# Patient Record
Sex: Female | Born: 1935 | Race: White | Hispanic: No | Marital: Married | Smoking: Never smoker
Health system: Southern US, Community
[De-identification: ages and names within clinical notes are randomized; demographics above are authoritative.]

## PROBLEM LIST (undated history)

## (undated) DIAGNOSIS — I480 Paroxysmal atrial fibrillation: Secondary | ICD-10-CM

## (undated) DIAGNOSIS — K219 Gastro-esophageal reflux disease without esophagitis: Secondary | ICD-10-CM

## (undated) DIAGNOSIS — D649 Anemia, unspecified: Secondary | ICD-10-CM

## (undated) DIAGNOSIS — M353 Polymyalgia rheumatica: Secondary | ICD-10-CM

## (undated) DIAGNOSIS — I4891 Unspecified atrial fibrillation: Secondary | ICD-10-CM

## (undated) DIAGNOSIS — M810 Age-related osteoporosis without current pathological fracture: Secondary | ICD-10-CM

## (undated) DIAGNOSIS — M1990 Unspecified osteoarthritis, unspecified site: Secondary | ICD-10-CM

## (undated) HISTORY — PX: HIP FRACTURE SURGERY: SHX118

## (undated) HISTORY — DX: Paroxysmal atrial fibrillation: I48.0

## (undated) HISTORY — PX: INSERT / REPLACE / REMOVE PACEMAKER: SUR710

## (undated) HISTORY — DX: Age-related osteoporosis without current pathological fracture: M81.0

## (undated) HISTORY — DX: Anemia, unspecified: D64.9

## (undated) HISTORY — DX: Gastro-esophageal reflux disease without esophagitis: K21.9

## (undated) HISTORY — DX: Unspecified atrial fibrillation: I48.91

## (undated) HISTORY — DX: Unspecified osteoarthritis, unspecified site: M19.90

## (undated) HISTORY — DX: Polymyalgia rheumatica: M35.3

## (undated) HISTORY — PX: ABDOMINAL HYSTERECTOMY: SHX81

---

## 1999-11-09 ENCOUNTER — Inpatient Hospital Stay (HOSPITAL_COMMUNITY): Admission: RE | Admit: 1999-11-09 | Discharge: 1999-11-11 | Payer: Self-pay | Admitting: Obstetrics & Gynecology

## 1999-11-09 ENCOUNTER — Encounter (INDEPENDENT_AMBULATORY_CARE_PROVIDER_SITE_OTHER): Payer: Self-pay

## 2000-11-16 ENCOUNTER — Other Ambulatory Visit: Admission: RE | Admit: 2000-11-16 | Discharge: 2000-11-16 | Payer: Self-pay | Admitting: Obstetrics & Gynecology

## 2001-12-11 ENCOUNTER — Other Ambulatory Visit: Admission: RE | Admit: 2001-12-11 | Discharge: 2001-12-11 | Payer: Self-pay | Admitting: Obstetrics & Gynecology

## 2003-07-09 HISTORY — PX: COLONOSCOPY: SHX174

## 2008-10-21 ENCOUNTER — Ambulatory Visit (HOSPITAL_COMMUNITY): Admission: RE | Admit: 2008-10-21 | Discharge: 2008-10-22 | Payer: Self-pay | Admitting: Orthopedic Surgery

## 2010-05-14 IMAGING — CR DG CHEST 2V
2 series · 2 of 2 positions shown · non-contrast
Comparison: None

CLINICAL DATA: Preoperative respiratory exam.  Thoracic spine
fractures.

CHEST - 2 VIEW

[w chest pa]
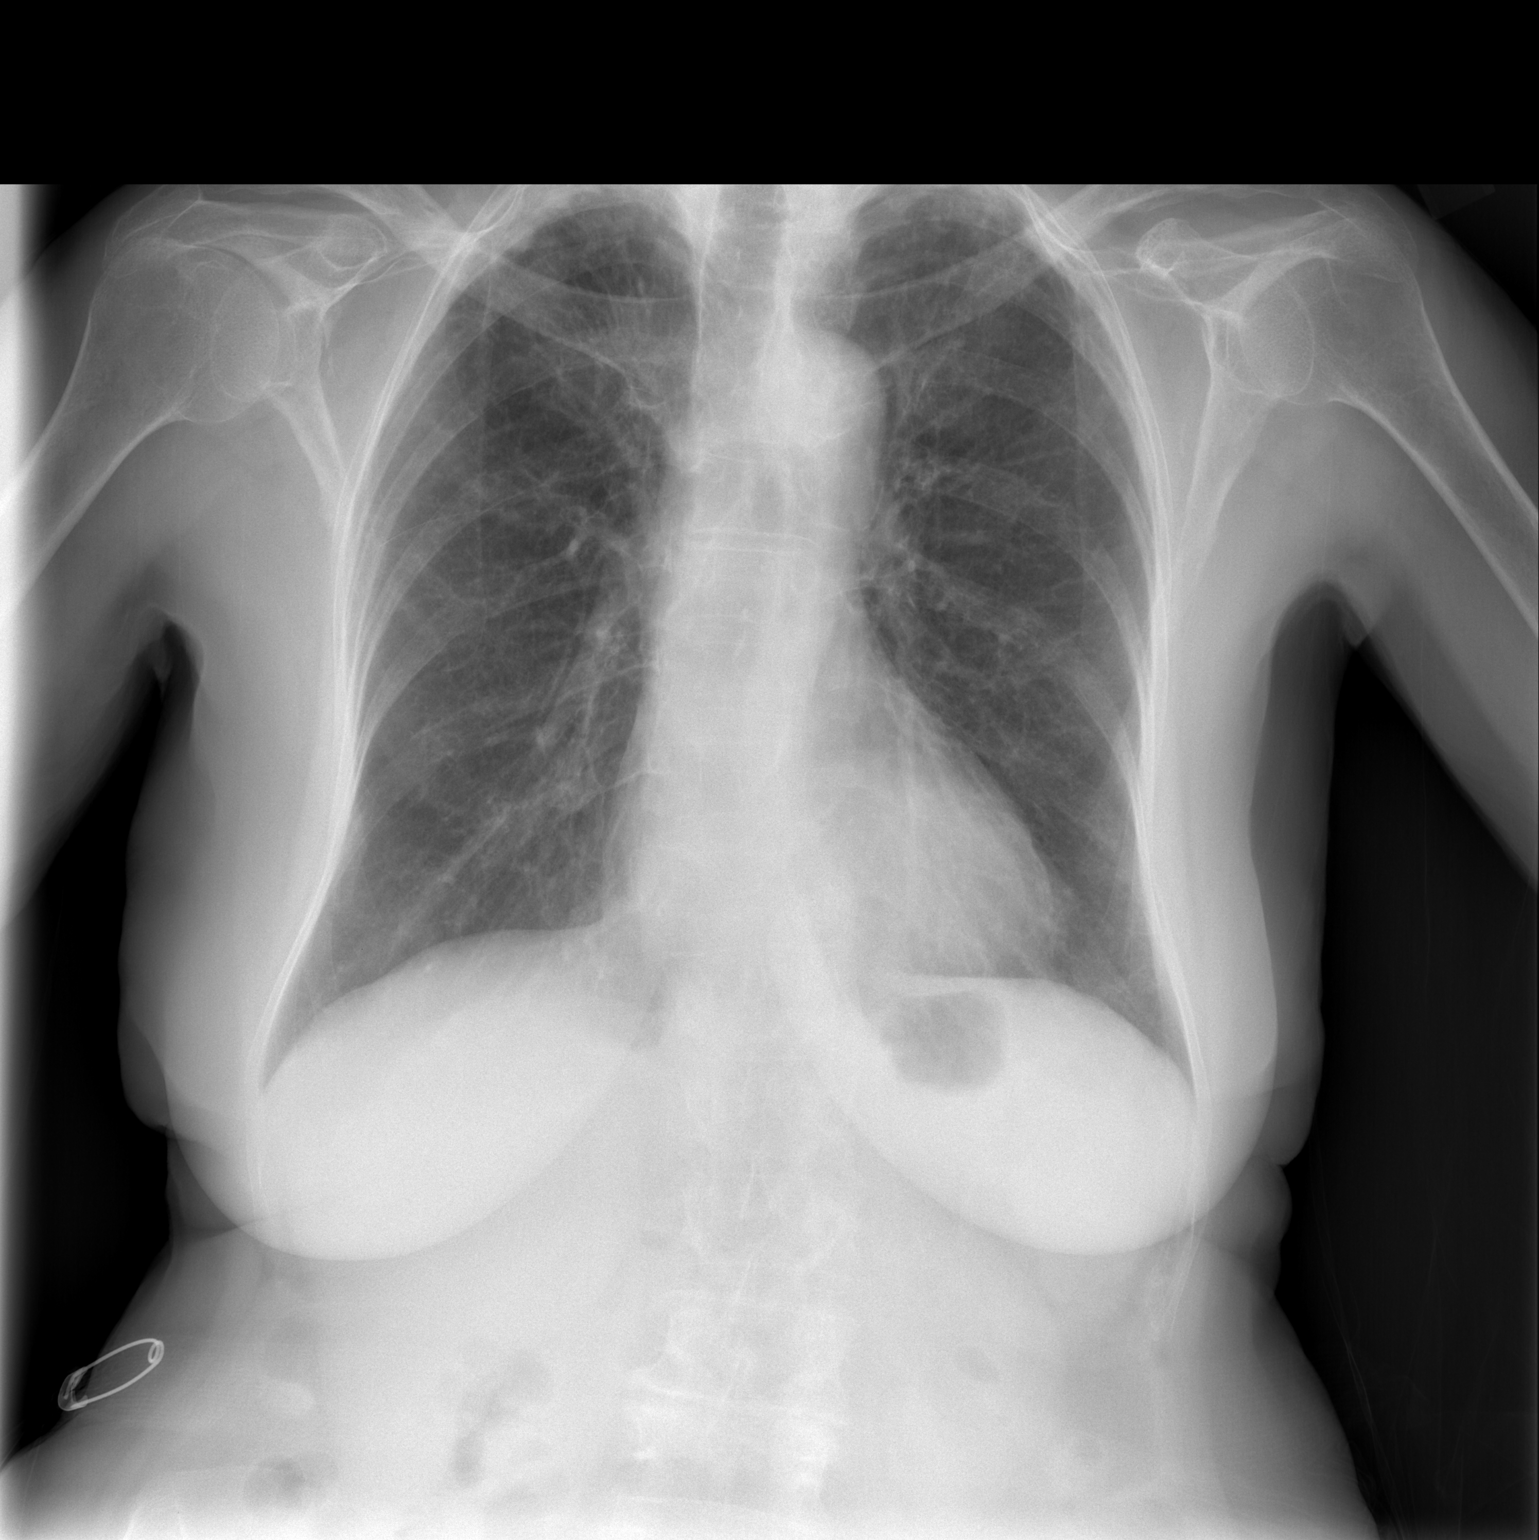

[w chest lat]
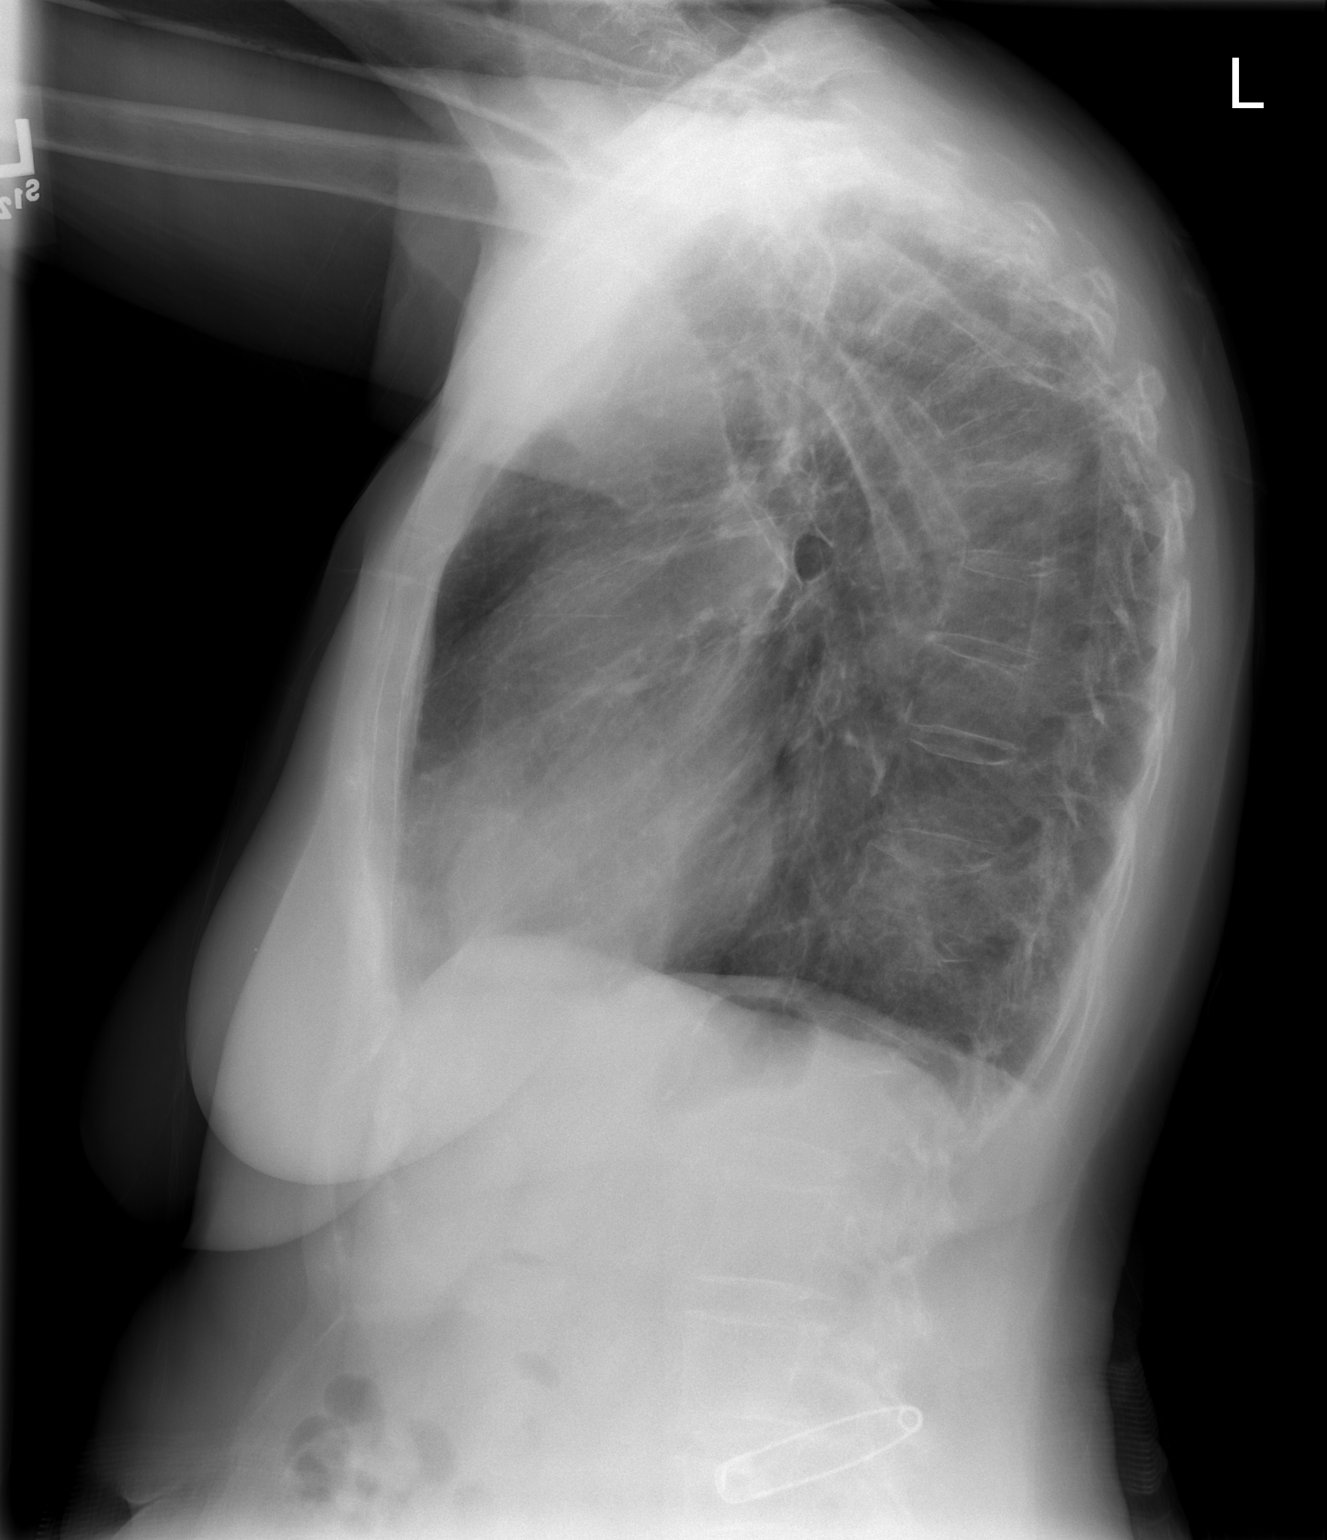

[2 of 2 positions shown; findings below may reference images not displayed]

FINDINGS: The heart size and vascularity are normal.  There is a
severe compression fracture of T6 and a mild compression fracture
of T11.  There is diffuse osteopenia.  Scarring is present in both
lung apices, right greater than left.  

There is some slight spiculation in the right upper lobe posterior
to the medial aspect of right clavicle without a discrete mass.  I
suspect this represents scarring.  However, follow-up chest x-ray
in 2-3 months may be useful unless an outside prior study is
available for comparison.  IMPRESSION:
No acute abnormalities.  Probable scarring in the right upper lobe
as described.  T6 and T11 compression fractures.  Follow-up chest x-
ray 2-3 months is recommended as described above.

## 2011-01-16 LAB — BASIC METABOLIC PANEL
BUN: 15 mg/dL (ref 6–23)
Calcium: 9.6 mg/dL (ref 8.4–10.5)
Creatinine, Ser: 0.74 mg/dL (ref 0.4–1.2)
GFR calc non Af Amer: >60 mL/min (ref >60)
Glucose, Bld: 84 mg/dL (ref 70–99)
Potassium: 4.2 mEq/L (ref 3.5–5.1)

## 2011-01-16 LAB — HEMOGLOBIN AND HEMATOCRIT, BLOOD: Hemoglobin: 13.4 g/dL (ref 12.0–15.0)

## 2011-02-14 NOTE — Op Note (Signed)
NAME:  Heather Thomas, Heather Thomas NO.:  [id number]   MEDICAL RECORD NO.:  [id number]          PATIENT TYPE:  AMB   LOCATION:  DAY                          FACILITY:  [facility]   PHYSICIAN:  Alvy Beal, MD    DATE OF BIRTH:  1927/07/14   DATE OF PROCEDURE:  10/21/2008  DATE OF DISCHARGE:                               OPERATIVE REPORT   PREOPERATIVE DIAGNOSIS:  T11 compression fracture.   POSTOPERATIVE DIAGNOSIS:  T11 compression fracture.   OPERATIVE PROCEDURE:  T11 kyphoplasty.   COMPLICATIONS:  None.   CONDITION:  Stable.   HISTORY:  This is a very pleasant 77 year old woman who presented with  severe debilitating thoracic pain.  Clinical and radiographic analysis  confirmed the diagnosis of a thoracic Sherman's kyphosis, T11  compression fracture and degenerative disk disease and stenosis in the  lumbar spine.  The patient localized her symptoms to the thoracic  fracture.  Attempts at conservative management failed to alleviate her  pain and so the decision was made to take the patient to the operating  room for a kyphoplasty.  All appropriate risks, benefits and  alternatives were discussed with the patient and consent was obtained.   PROCEDURE IN DETAIL:  The patient was brought to the operating room and  placed supine on the operating table.  After successful induction of  general anesthesia and endotracheal intubation, TEDs and SCDs were  applied.  She was turned prone onto her chest and pelvic roll.  The arms  were padded at the side and the back was prepped and draped in standard  fashion.  Two C-arms were then sterilely draped and then brought into  the wound, one in the lateral and the other in the AP for biplane  fluoroscopy.  I then identified by counting from L5 up to T11.  I  confirmed that this was on the AP as well.  Once I confirmed the  appropriate level I then made a stab incision and percutaneously  identified the appropriate starting position for  my trocar.  I then  advanced the trocar using biplanar fluoro across the pedicle and into  the posterior aspect of the T11 vertebral body.  Confirming position in  AP and lateral I then advanced it into the vertebral body.  I repeated  this on the contralateral side.  I then took the drill and went through  the working trocar and drilled down and then placed a curette to loosen  up any sclerotic bone.  I then placed the inflatable Kyphon bone tamp in  and began inflating them appropriately.  I monitored the insufflation  with the AP and lateral x-rays to ensure there was no violation of the  inferior, lateral or superior or posterior cortices.  Once I had an  adequate pressure and fill I then deflated one balloon and then inserted  the cement on that side.  I then deflated the other balloon and inserted  cement.  I then continued to insert the cement until I had an adequate  fill.  There was no significant leak of  the cement noted.  I had  excellent fill and inner digitation of the cement.   I allowed the cement to harden and then I removed the trocar and cleaned  the skin with irrigation.  I then placed a simple 4-0 Monocryl stitch in  the skin and a Band-Aid.  The patient was extubated and transferred to  the PACU without incident.  At the end of the case all needle and sponge  counts were correct.      Alvy Beal, MD  Electronically Signed     DDB/MEDQ  D:  10/21/2008  T:  10/21/2008  Job:  [id number]

## 2011-02-17 NOTE — Discharge Summary (Signed)
[facility]  Patient:    Heather Thomas, Heather Thomas                         MRN: [id number] Adm. Date:  [id number] Disc. Date: [id number] Attending:  Genia Del                           Discharge Summary  ADMISSION DIAGNOSIS:          Symptomatic uterine prolapse grade III/III.  DISCHARGE DIAGNOSIS:          Symptomatic uterine prolapse grade III/III.  PROCEDURE:                    Vaginal hysterectomy.  HOSPITAL COURSE:              There were no complications during surgery and the postoperative course went uneventfully.  The hemoglobin postoperative was 10.8.  The patient remained afebrile and vaginal discharge was very light.  DISCHARGE MEDICATIONS:        1. Ibuprofen p.r.n.                               2. Darvocet p.r.n.  FOLLOW-UP:                    The patient is to follow up at [facility] in two weeks.  DISCHARGE INSTRUCTIONS:       Postoperative instructions were given to the patient. DD:  11/25/99 TD:  11/25/99 Job: [id number]

## 2011-02-17 NOTE — Op Note (Signed)
[facility]  Patient:    Heather Thomas, Heather Thomas                         MRN: [id number] Proc. Date: 11/09/99 Adm. Date:  [id number] Attending:  Genia Del                           Operative Report  PREOPERATIVE DIAGNOSIS:       Symptomatic uterine prolapse grade III/III.  POSTOPERATIVE DIAGNOSIS:      Symptomatic uterine prolapse grade III/III.  OPERATION:  SURGEON:                      Genia Del, M.D.  ASSISTANT:                    Cordelia Pen A. Rosalio Macadamia, M.D.  ANESTHESIA:                   Belva Agee, M.D.  ESTIMATED BLOOD LOSS:  DESCRIPTION OF PROCEDURE:     Under general anesthesia with endotracheal intubation the patient was in lithotomy position.  The patient is prepped with Betadine on the suprapubic, vulvar, and vaginal areas.  Bladder catheterization is done and drapes are put in place as usual.  The vaginal examination under general anesthesia reveals a normal size uterus, prolapsed, grade III/III.  No adnexal mass.  The short way retractor is put inside the vagina.  The cervix is grasped anteriorly and posteriorly with Jacobsen clamps and the cervix is infiltrated circumferentially with Xylocaine with epinephrine.  Then a circumferential incision is done with  scalpel all around the cervix at the junction of the vaginal mucosa.  Then the vaginal mucosa is retracted anteriorly and posteriorly and the peritoneum is entered anteriorly and then posteriorly.  Retractors are inserted anteriorly and posteriorly.  The cardinal ligaments are clamped with Heaney, sectioned with Mayo scissors, and ligated with Vicryl 0 on the left side and then on the right side. We then progressed to clamp the uterosacral ligament with the Heaney.  We sectioned with Mayo scissors and sutured with Vicryl 0 on the left side and on the right side.  Then the uterine arteries are clamped Heaney on the left side and on the  right side, sectioned  with Mayos, and sutured with Vicryl 0.  We progressed on ach side of the uterus toward the round ligaments, the tube, and the utero-ovarian ligament.  Those are clamped with Heaneys, sectioned with Mayos, and sutured with Vicryl 0.  The sutures are kept at the level of the uterosacral ligaments and the round ligaments for suspension.  We then removed the uterus in one piece and it is sent to pathology.  All of the pedicles are verified and hemostasis is good. The ovaries are not visualized because of atrophy, but they were completely normal nd small on ultrasound.  We then proceeded to two McCall sutures with Vicryl 0 to close the space between the uterosacral ligaments.  We then put a suspension suture with Vicryl 0 including the anterior vaginal wall, the anterior peritoneum, the  round ligament, the uterosacral ligament, and the posterior peritoneum with a posterior lateral vaginal mucosa.  This was kept on a hemostat.  We did exactly the same on the right side and the left side.  We then put a locked running suture ith Vicryl 2-0 on the posterior aspect of the  vaginal mucosa to complete hemostasis. We then attached sutures from the uterosacral ligament in the midline.  We then  attached the suspension sutures on the right side and the left side and we finished by closing the vaginal mucosa vertically with figure-of-eight sutures with Vicryl 2-0.  Hemostasis is good.  The instruments are then removed.  There is no significant cystocele nor rectocele.  We put a bladder catheter that will stay until tomorrow morning.  The estimated blood loss was 75 cc.  No complications occurred.  Sponge, needle, and instrument counts were correct x 2.  The patient was brought to the recovery room in good status.  The patient received Ancef 1 gram IV at the beginning of the intervention. DD:  11/09/99 TD:  11/09/99 Job: [id number]

## 2011-07-17 ENCOUNTER — Encounter (INDEPENDENT_AMBULATORY_CARE_PROVIDER_SITE_OTHER): Payer: Self-pay | Admitting: Ophthalmology

## 2011-07-21 ENCOUNTER — Encounter (INDEPENDENT_AMBULATORY_CARE_PROVIDER_SITE_OTHER): Payer: Medicare Other | Admitting: Ophthalmology

## 2011-07-21 DIAGNOSIS — H35379 Puckering of macula, unspecified eye: Secondary | ICD-10-CM

## 2011-07-21 DIAGNOSIS — H2700 Aphakia, unspecified eye: Secondary | ICD-10-CM

## 2011-07-21 DIAGNOSIS — H3530 Unspecified macular degeneration: Secondary | ICD-10-CM

## 2011-07-21 DIAGNOSIS — H4420 Degenerative myopia, unspecified eye: Secondary | ICD-10-CM

## 2011-10-19 DIAGNOSIS — R296 Repeated falls: Secondary | ICD-10-CM | POA: Diagnosis not present

## 2011-10-19 DIAGNOSIS — S2220XA Unspecified fracture of sternum, initial encounter for closed fracture: Secondary | ICD-10-CM | POA: Diagnosis not present

## 2011-10-19 DIAGNOSIS — R071 Chest pain on breathing: Secondary | ICD-10-CM | POA: Diagnosis not present

## 2011-10-19 DIAGNOSIS — I252 Old myocardial infarction: Secondary | ICD-10-CM | POA: Diagnosis not present

## 2011-10-19 DIAGNOSIS — Z950 Presence of cardiac pacemaker: Secondary | ICD-10-CM | POA: Diagnosis not present

## 2011-11-01 DIAGNOSIS — G548 Other nerve root and plexus disorders: Secondary | ICD-10-CM | POA: Diagnosis not present

## 2011-11-01 DIAGNOSIS — IMO0002 Reserved for concepts with insufficient information to code with codable children: Secondary | ICD-10-CM | POA: Diagnosis not present

## 2011-11-10 DIAGNOSIS — I498 Other specified cardiac arrhythmias: Secondary | ICD-10-CM | POA: Diagnosis not present

## 2011-12-26 DIAGNOSIS — M159 Polyosteoarthritis, unspecified: Secondary | ICD-10-CM | POA: Diagnosis not present

## 2012-01-01 DIAGNOSIS — Z79899 Other long term (current) drug therapy: Secondary | ICD-10-CM | POA: Diagnosis not present

## 2012-01-01 DIAGNOSIS — I10 Essential (primary) hypertension: Secondary | ICD-10-CM | POA: Diagnosis not present

## 2012-01-01 DIAGNOSIS — E785 Hyperlipidemia, unspecified: Secondary | ICD-10-CM | POA: Diagnosis not present

## 2012-01-18 DIAGNOSIS — G548 Other nerve root and plexus disorders: Secondary | ICD-10-CM | POA: Diagnosis not present

## 2012-01-18 DIAGNOSIS — M159 Polyosteoarthritis, unspecified: Secondary | ICD-10-CM | POA: Diagnosis not present

## 2012-01-18 DIAGNOSIS — J309 Allergic rhinitis, unspecified: Secondary | ICD-10-CM | POA: Diagnosis not present

## 2012-01-18 DIAGNOSIS — IMO0002 Reserved for concepts with insufficient information to code with codable children: Secondary | ICD-10-CM | POA: Diagnosis not present

## 2012-01-19 DIAGNOSIS — IMO0002 Reserved for concepts with insufficient information to code with codable children: Secondary | ICD-10-CM | POA: Diagnosis not present

## 2012-01-19 DIAGNOSIS — S2220XA Unspecified fracture of sternum, initial encounter for closed fracture: Secondary | ICD-10-CM | POA: Diagnosis not present

## 2012-01-19 DIAGNOSIS — R079 Chest pain, unspecified: Secondary | ICD-10-CM | POA: Diagnosis not present

## 2012-01-25 DIAGNOSIS — R0789 Other chest pain: Secondary | ICD-10-CM | POA: Diagnosis not present

## 2012-01-25 DIAGNOSIS — G548 Other nerve root and plexus disorders: Secondary | ICD-10-CM | POA: Diagnosis not present

## 2012-02-15 DIAGNOSIS — L570 Actinic keratosis: Secondary | ICD-10-CM | POA: Diagnosis not present

## 2012-03-19 DIAGNOSIS — I4891 Unspecified atrial fibrillation: Secondary | ICD-10-CM | POA: Diagnosis not present

## 2012-03-19 DIAGNOSIS — Z950 Presence of cardiac pacemaker: Secondary | ICD-10-CM | POA: Diagnosis not present

## 2012-03-19 DIAGNOSIS — E785 Hyperlipidemia, unspecified: Secondary | ICD-10-CM | POA: Diagnosis not present

## 2012-03-21 DIAGNOSIS — K219 Gastro-esophageal reflux disease without esophagitis: Secondary | ICD-10-CM | POA: Diagnosis not present

## 2012-03-21 DIAGNOSIS — E7800 Pure hypercholesterolemia, unspecified: Secondary | ICD-10-CM | POA: Diagnosis not present

## 2012-03-21 DIAGNOSIS — H6690 Otitis media, unspecified, unspecified ear: Secondary | ICD-10-CM | POA: Diagnosis not present

## 2012-03-21 DIAGNOSIS — I4891 Unspecified atrial fibrillation: Secondary | ICD-10-CM | POA: Diagnosis not present

## 2012-03-28 DIAGNOSIS — H6120 Impacted cerumen, unspecified ear: Secondary | ICD-10-CM | POA: Diagnosis not present

## 2012-04-26 DIAGNOSIS — Z950 Presence of cardiac pacemaker: Secondary | ICD-10-CM | POA: Diagnosis not present

## 2012-04-26 DIAGNOSIS — J449 Chronic obstructive pulmonary disease, unspecified: Secondary | ICD-10-CM | POA: Diagnosis not present

## 2012-04-26 DIAGNOSIS — S2220XA Unspecified fracture of sternum, initial encounter for closed fracture: Secondary | ICD-10-CM | POA: Diagnosis not present

## 2012-05-09 DIAGNOSIS — M899 Disorder of bone, unspecified: Secondary | ICD-10-CM | POA: Diagnosis not present

## 2012-05-23 DIAGNOSIS — Z950 Presence of cardiac pacemaker: Secondary | ICD-10-CM | POA: Diagnosis not present

## 2012-06-01 DIAGNOSIS — J209 Acute bronchitis, unspecified: Secondary | ICD-10-CM | POA: Diagnosis not present

## 2012-06-01 DIAGNOSIS — E7800 Pure hypercholesterolemia, unspecified: Secondary | ICD-10-CM | POA: Diagnosis not present

## 2012-06-12 DIAGNOSIS — J069 Acute upper respiratory infection, unspecified: Secondary | ICD-10-CM | POA: Diagnosis not present

## 2012-06-12 DIAGNOSIS — R05 Cough: Secondary | ICD-10-CM | POA: Diagnosis not present

## 2012-06-17 DIAGNOSIS — R05 Cough: Secondary | ICD-10-CM | POA: Diagnosis not present

## 2012-06-17 DIAGNOSIS — S335XXA Sprain of ligaments of lumbar spine, initial encounter: Secondary | ICD-10-CM | POA: Diagnosis not present

## 2012-06-20 DIAGNOSIS — E7800 Pure hypercholesterolemia, unspecified: Secondary | ICD-10-CM | POA: Diagnosis not present

## 2012-06-20 DIAGNOSIS — X58XXXA Exposure to other specified factors, initial encounter: Secondary | ICD-10-CM | POA: Diagnosis not present

## 2012-06-20 DIAGNOSIS — S239XXA Sprain of unspecified parts of thorax, initial encounter: Secondary | ICD-10-CM | POA: Diagnosis not present

## 2012-06-20 DIAGNOSIS — M549 Dorsalgia, unspecified: Secondary | ICD-10-CM | POA: Diagnosis not present

## 2012-06-26 DIAGNOSIS — M545 Low back pain, unspecified: Secondary | ICD-10-CM | POA: Diagnosis not present

## 2012-06-26 DIAGNOSIS — S32009A Unspecified fracture of unspecified lumbar vertebra, initial encounter for closed fracture: Secondary | ICD-10-CM | POA: Diagnosis not present

## 2012-06-26 DIAGNOSIS — M546 Pain in thoracic spine: Secondary | ICD-10-CM | POA: Diagnosis not present

## 2012-07-29 DIAGNOSIS — H26499 Other secondary cataract, unspecified eye: Secondary | ICD-10-CM | POA: Diagnosis not present

## 2012-07-29 DIAGNOSIS — H353290 Exudative age-related macular degeneration, unspecified eye, stage unspecified: Secondary | ICD-10-CM | POA: Diagnosis not present

## 2012-07-30 DIAGNOSIS — K5900 Constipation, unspecified: Secondary | ICD-10-CM | POA: Diagnosis not present

## 2012-07-30 DIAGNOSIS — R1013 Epigastric pain: Secondary | ICD-10-CM | POA: Diagnosis not present

## 2012-08-03 DIAGNOSIS — E7800 Pure hypercholesterolemia, unspecified: Secondary | ICD-10-CM | POA: Diagnosis not present

## 2012-08-03 DIAGNOSIS — Z950 Presence of cardiac pacemaker: Secondary | ICD-10-CM | POA: Diagnosis not present

## 2012-08-03 DIAGNOSIS — M810 Age-related osteoporosis without current pathological fracture: Secondary | ICD-10-CM | POA: Diagnosis not present

## 2012-08-03 DIAGNOSIS — R1032 Left lower quadrant pain: Secondary | ICD-10-CM | POA: Diagnosis not present

## 2012-08-03 DIAGNOSIS — K5289 Other specified noninfective gastroenteritis and colitis: Secondary | ICD-10-CM | POA: Diagnosis not present

## 2012-08-03 DIAGNOSIS — R197 Diarrhea, unspecified: Secondary | ICD-10-CM | POA: Diagnosis not present

## 2012-08-03 DIAGNOSIS — K921 Melena: Secondary | ICD-10-CM | POA: Diagnosis not present

## 2012-08-16 DIAGNOSIS — IMO0002 Reserved for concepts with insufficient information to code with codable children: Secondary | ICD-10-CM | POA: Diagnosis not present

## 2012-08-16 DIAGNOSIS — M159 Polyosteoarthritis, unspecified: Secondary | ICD-10-CM | POA: Diagnosis not present

## 2012-08-16 DIAGNOSIS — M25519 Pain in unspecified shoulder: Secondary | ICD-10-CM | POA: Diagnosis not present

## 2012-08-19 DIAGNOSIS — S32009A Unspecified fracture of unspecified lumbar vertebra, initial encounter for closed fracture: Secondary | ICD-10-CM | POA: Diagnosis not present

## 2012-08-21 DIAGNOSIS — A499 Bacterial infection, unspecified: Secondary | ICD-10-CM | POA: Diagnosis not present

## 2012-08-21 DIAGNOSIS — R627 Adult failure to thrive: Secondary | ICD-10-CM | POA: Diagnosis not present

## 2012-08-21 DIAGNOSIS — R5381 Other malaise: Secondary | ICD-10-CM | POA: Diagnosis not present

## 2012-08-21 DIAGNOSIS — N390 Urinary tract infection, site not specified: Secondary | ICD-10-CM | POA: Diagnosis not present

## 2012-08-21 DIAGNOSIS — G9389 Other specified disorders of brain: Secondary | ICD-10-CM | POA: Diagnosis not present

## 2012-08-21 DIAGNOSIS — R5383 Other fatigue: Secondary | ICD-10-CM | POA: Diagnosis not present

## 2012-08-22 DIAGNOSIS — R5381 Other malaise: Secondary | ICD-10-CM | POA: Diagnosis not present

## 2012-08-22 DIAGNOSIS — R5383 Other fatigue: Secondary | ICD-10-CM | POA: Diagnosis not present

## 2012-08-22 DIAGNOSIS — IMO0001 Reserved for inherently not codable concepts without codable children: Secondary | ICD-10-CM | POA: Diagnosis not present

## 2012-08-22 DIAGNOSIS — R269 Unspecified abnormalities of gait and mobility: Secondary | ICD-10-CM | POA: Diagnosis not present

## 2012-08-26 DIAGNOSIS — IMO0001 Reserved for inherently not codable concepts without codable children: Secondary | ICD-10-CM | POA: Diagnosis not present

## 2012-08-26 DIAGNOSIS — R269 Unspecified abnormalities of gait and mobility: Secondary | ICD-10-CM | POA: Diagnosis not present

## 2012-08-26 DIAGNOSIS — R5381 Other malaise: Secondary | ICD-10-CM | POA: Diagnosis not present

## 2012-08-28 DIAGNOSIS — R5383 Other fatigue: Secondary | ICD-10-CM | POA: Diagnosis not present

## 2012-08-28 DIAGNOSIS — IMO0001 Reserved for inherently not codable concepts without codable children: Secondary | ICD-10-CM | POA: Diagnosis not present

## 2012-08-28 DIAGNOSIS — R269 Unspecified abnormalities of gait and mobility: Secondary | ICD-10-CM | POA: Diagnosis not present

## 2012-08-28 DIAGNOSIS — R5381 Other malaise: Secondary | ICD-10-CM | POA: Diagnosis not present

## 2012-09-30 DIAGNOSIS — S32009A Unspecified fracture of unspecified lumbar vertebra, initial encounter for closed fracture: Secondary | ICD-10-CM | POA: Diagnosis not present

## 2012-10-07 DIAGNOSIS — M25519 Pain in unspecified shoulder: Secondary | ICD-10-CM | POA: Diagnosis not present

## 2012-10-10 DIAGNOSIS — Z950 Presence of cardiac pacemaker: Secondary | ICD-10-CM | POA: Diagnosis not present

## 2012-10-24 DIAGNOSIS — Z23 Encounter for immunization: Secondary | ICD-10-CM | POA: Diagnosis not present

## 2012-11-01 DIAGNOSIS — Z79899 Other long term (current) drug therapy: Secondary | ICD-10-CM | POA: Diagnosis not present

## 2012-11-01 DIAGNOSIS — E782 Mixed hyperlipidemia: Secondary | ICD-10-CM | POA: Diagnosis not present

## 2012-11-01 DIAGNOSIS — E559 Vitamin D deficiency, unspecified: Secondary | ICD-10-CM | POA: Diagnosis not present

## 2012-11-07 DIAGNOSIS — M159 Polyosteoarthritis, unspecified: Secondary | ICD-10-CM | POA: Diagnosis not present

## 2012-11-07 DIAGNOSIS — E559 Vitamin D deficiency, unspecified: Secondary | ICD-10-CM | POA: Diagnosis not present

## 2012-11-07 DIAGNOSIS — E538 Deficiency of other specified B group vitamins: Secondary | ICD-10-CM | POA: Diagnosis not present

## 2012-11-07 DIAGNOSIS — M25519 Pain in unspecified shoulder: Secondary | ICD-10-CM | POA: Diagnosis not present

## 2012-12-10 DIAGNOSIS — L570 Actinic keratosis: Secondary | ICD-10-CM | POA: Diagnosis not present

## 2013-01-05 DIAGNOSIS — I10 Essential (primary) hypertension: Secondary | ICD-10-CM | POA: Diagnosis not present

## 2013-01-05 DIAGNOSIS — S93409A Sprain of unspecified ligament of unspecified ankle, initial encounter: Secondary | ICD-10-CM | POA: Diagnosis not present

## 2013-01-05 DIAGNOSIS — E7800 Pure hypercholesterolemia, unspecified: Secondary | ICD-10-CM | POA: Diagnosis not present

## 2013-01-05 DIAGNOSIS — W06XXXA Fall from bed, initial encounter: Secondary | ICD-10-CM | POA: Diagnosis not present

## 2013-01-27 DIAGNOSIS — J309 Allergic rhinitis, unspecified: Secondary | ICD-10-CM | POA: Diagnosis not present

## 2013-01-27 DIAGNOSIS — J45909 Unspecified asthma, uncomplicated: Secondary | ICD-10-CM | POA: Diagnosis not present

## 2013-01-29 DIAGNOSIS — H26499 Other secondary cataract, unspecified eye: Secondary | ICD-10-CM | POA: Diagnosis not present

## 2013-01-29 DIAGNOSIS — H353290 Exudative age-related macular degeneration, unspecified eye, stage unspecified: Secondary | ICD-10-CM | POA: Diagnosis not present

## 2013-02-04 DIAGNOSIS — Z79899 Other long term (current) drug therapy: Secondary | ICD-10-CM | POA: Diagnosis not present

## 2013-02-04 DIAGNOSIS — E538 Deficiency of other specified B group vitamins: Secondary | ICD-10-CM | POA: Diagnosis not present

## 2013-02-04 DIAGNOSIS — I4891 Unspecified atrial fibrillation: Secondary | ICD-10-CM | POA: Diagnosis not present

## 2013-02-04 DIAGNOSIS — IMO0002 Reserved for concepts with insufficient information to code with codable children: Secondary | ICD-10-CM | POA: Diagnosis not present

## 2013-02-04 DIAGNOSIS — E559 Vitamin D deficiency, unspecified: Secondary | ICD-10-CM | POA: Diagnosis not present

## 2013-02-26 DIAGNOSIS — R29898 Other symptoms and signs involving the musculoskeletal system: Secondary | ICD-10-CM | POA: Diagnosis not present

## 2013-02-26 DIAGNOSIS — Z9181 History of falling: Secondary | ICD-10-CM | POA: Diagnosis not present

## 2013-02-26 DIAGNOSIS — R269 Unspecified abnormalities of gait and mobility: Secondary | ICD-10-CM | POA: Diagnosis not present

## 2013-03-03 DIAGNOSIS — M6281 Muscle weakness (generalized): Secondary | ICD-10-CM | POA: Diagnosis not present

## 2013-03-03 DIAGNOSIS — R269 Unspecified abnormalities of gait and mobility: Secondary | ICD-10-CM | POA: Diagnosis not present

## 2013-03-03 DIAGNOSIS — R29898 Other symptoms and signs involving the musculoskeletal system: Secondary | ICD-10-CM | POA: Diagnosis not present

## 2013-03-03 DIAGNOSIS — IMO0001 Reserved for inherently not codable concepts without codable children: Secondary | ICD-10-CM | POA: Diagnosis not present

## 2013-04-01 DIAGNOSIS — R29898 Other symptoms and signs involving the musculoskeletal system: Secondary | ICD-10-CM | POA: Diagnosis not present

## 2013-04-01 DIAGNOSIS — Z950 Presence of cardiac pacemaker: Secondary | ICD-10-CM | POA: Diagnosis not present

## 2013-04-01 DIAGNOSIS — I4891 Unspecified atrial fibrillation: Secondary | ICD-10-CM | POA: Diagnosis not present

## 2013-04-01 DIAGNOSIS — M6281 Muscle weakness (generalized): Secondary | ICD-10-CM | POA: Diagnosis not present

## 2013-04-01 DIAGNOSIS — IMO0001 Reserved for inherently not codable concepts without codable children: Secondary | ICD-10-CM | POA: Diagnosis not present

## 2013-04-01 DIAGNOSIS — E785 Hyperlipidemia, unspecified: Secondary | ICD-10-CM | POA: Diagnosis not present

## 2013-04-01 DIAGNOSIS — R269 Unspecified abnormalities of gait and mobility: Secondary | ICD-10-CM | POA: Diagnosis not present

## 2013-04-03 DIAGNOSIS — R269 Unspecified abnormalities of gait and mobility: Secondary | ICD-10-CM | POA: Diagnosis not present

## 2013-04-03 DIAGNOSIS — IMO0001 Reserved for inherently not codable concepts without codable children: Secondary | ICD-10-CM | POA: Diagnosis not present

## 2013-04-03 DIAGNOSIS — M6281 Muscle weakness (generalized): Secondary | ICD-10-CM | POA: Diagnosis not present

## 2013-04-03 DIAGNOSIS — R29898 Other symptoms and signs involving the musculoskeletal system: Secondary | ICD-10-CM | POA: Diagnosis not present

## 2013-04-08 DIAGNOSIS — R29898 Other symptoms and signs involving the musculoskeletal system: Secondary | ICD-10-CM | POA: Diagnosis not present

## 2013-04-08 DIAGNOSIS — R269 Unspecified abnormalities of gait and mobility: Secondary | ICD-10-CM | POA: Diagnosis not present

## 2013-04-08 DIAGNOSIS — M6281 Muscle weakness (generalized): Secondary | ICD-10-CM | POA: Diagnosis not present

## 2013-04-08 DIAGNOSIS — IMO0001 Reserved for inherently not codable concepts without codable children: Secondary | ICD-10-CM | POA: Diagnosis not present

## 2013-04-10 DIAGNOSIS — IMO0001 Reserved for inherently not codable concepts without codable children: Secondary | ICD-10-CM | POA: Diagnosis not present

## 2013-04-10 DIAGNOSIS — R269 Unspecified abnormalities of gait and mobility: Secondary | ICD-10-CM | POA: Diagnosis not present

## 2013-04-10 DIAGNOSIS — M6281 Muscle weakness (generalized): Secondary | ICD-10-CM | POA: Diagnosis not present

## 2013-04-10 DIAGNOSIS — R29898 Other symptoms and signs involving the musculoskeletal system: Secondary | ICD-10-CM | POA: Diagnosis not present

## 2013-05-20 DIAGNOSIS — M5430 Sciatica, unspecified side: Secondary | ICD-10-CM | POA: Diagnosis not present

## 2013-05-22 DIAGNOSIS — E785 Hyperlipidemia, unspecified: Secondary | ICD-10-CM | POA: Diagnosis not present

## 2013-05-22 DIAGNOSIS — Z950 Presence of cardiac pacemaker: Secondary | ICD-10-CM | POA: Diagnosis not present

## 2013-05-22 DIAGNOSIS — I4891 Unspecified atrial fibrillation: Secondary | ICD-10-CM | POA: Diagnosis not present

## 2013-05-26 DIAGNOSIS — R0602 Shortness of breath: Secondary | ICD-10-CM | POA: Diagnosis not present

## 2013-05-26 DIAGNOSIS — I059 Rheumatic mitral valve disease, unspecified: Secondary | ICD-10-CM | POA: Diagnosis not present

## 2013-05-26 DIAGNOSIS — I369 Nonrheumatic tricuspid valve disorder, unspecified: Secondary | ICD-10-CM | POA: Diagnosis not present

## 2013-06-19 DIAGNOSIS — I495 Sick sinus syndrome: Secondary | ICD-10-CM | POA: Diagnosis not present

## 2013-06-25 DIAGNOSIS — R109 Unspecified abdominal pain: Secondary | ICD-10-CM | POA: Diagnosis not present

## 2013-06-25 DIAGNOSIS — K219 Gastro-esophageal reflux disease without esophagitis: Secondary | ICD-10-CM | POA: Diagnosis not present

## 2013-07-21 DIAGNOSIS — J309 Allergic rhinitis, unspecified: Secondary | ICD-10-CM | POA: Diagnosis not present

## 2013-07-21 DIAGNOSIS — R05 Cough: Secondary | ICD-10-CM | POA: Diagnosis not present

## 2013-07-22 DIAGNOSIS — K5900 Constipation, unspecified: Secondary | ICD-10-CM | POA: Diagnosis not present

## 2013-07-28 DIAGNOSIS — S41109A Unspecified open wound of unspecified upper arm, initial encounter: Secondary | ICD-10-CM | POA: Diagnosis not present

## 2013-07-28 DIAGNOSIS — S7290XA Unspecified fracture of unspecified femur, initial encounter for closed fracture: Secondary | ICD-10-CM | POA: Diagnosis not present

## 2013-07-28 DIAGNOSIS — M25569 Pain in unspecified knee: Secondary | ICD-10-CM | POA: Diagnosis not present

## 2013-07-28 DIAGNOSIS — S0190XA Unspecified open wound of unspecified part of head, initial encounter: Secondary | ICD-10-CM | POA: Diagnosis not present

## 2013-07-28 DIAGNOSIS — E785 Hyperlipidemia, unspecified: Secondary | ICD-10-CM | POA: Diagnosis not present

## 2013-07-28 DIAGNOSIS — T148XXA Other injury of unspecified body region, initial encounter: Secondary | ICD-10-CM | POA: Diagnosis present

## 2013-07-28 DIAGNOSIS — S0993XA Unspecified injury of face, initial encounter: Secondary | ICD-10-CM | POA: Diagnosis not present

## 2013-07-28 DIAGNOSIS — M25559 Pain in unspecified hip: Secondary | ICD-10-CM | POA: Diagnosis not present

## 2013-07-28 DIAGNOSIS — S72409A Unspecified fracture of lower end of unspecified femur, initial encounter for closed fracture: Secondary | ICD-10-CM | POA: Diagnosis not present

## 2013-07-28 DIAGNOSIS — W010XXA Fall on same level from slipping, tripping and stumbling without subsequent striking against object, initial encounter: Secondary | ICD-10-CM | POA: Diagnosis not present

## 2013-07-28 DIAGNOSIS — S8290XD Unspecified fracture of unspecified lower leg, subsequent encounter for closed fracture with routine healing: Secondary | ICD-10-CM | POA: Diagnosis not present

## 2013-07-28 DIAGNOSIS — M2550 Pain in unspecified joint: Secondary | ICD-10-CM | POA: Diagnosis not present

## 2013-07-28 DIAGNOSIS — Z7401 Bed confinement status: Secondary | ICD-10-CM | POA: Diagnosis not present

## 2013-07-28 DIAGNOSIS — M79609 Pain in unspecified limb: Secondary | ICD-10-CM | POA: Diagnosis not present

## 2013-07-28 DIAGNOSIS — R262 Difficulty in walking, not elsewhere classified: Secondary | ICD-10-CM | POA: Diagnosis not present

## 2013-07-28 DIAGNOSIS — M1990 Unspecified osteoarthritis, unspecified site: Secondary | ICD-10-CM | POA: Diagnosis present

## 2013-07-28 DIAGNOSIS — S72453A Displaced supracondylar fracture without intracondylar extension of lower end of unspecified femur, initial encounter for closed fracture: Secondary | ICD-10-CM | POA: Diagnosis not present

## 2013-07-28 DIAGNOSIS — M25519 Pain in unspecified shoulder: Secondary | ICD-10-CM | POA: Diagnosis not present

## 2013-07-28 DIAGNOSIS — E860 Dehydration: Secondary | ICD-10-CM | POA: Diagnosis present

## 2013-07-28 DIAGNOSIS — S8990XA Unspecified injury of unspecified lower leg, initial encounter: Secondary | ICD-10-CM | POA: Diagnosis not present

## 2013-07-28 DIAGNOSIS — R112 Nausea with vomiting, unspecified: Secondary | ICD-10-CM | POA: Diagnosis not present

## 2013-07-28 DIAGNOSIS — S4980XA Other specified injuries of shoulder and upper arm, unspecified arm, initial encounter: Secondary | ICD-10-CM | POA: Diagnosis not present

## 2013-07-28 DIAGNOSIS — S79919A Unspecified injury of unspecified hip, initial encounter: Secondary | ICD-10-CM | POA: Diagnosis not present

## 2013-07-28 DIAGNOSIS — E7800 Pure hypercholesterolemia, unspecified: Secondary | ICD-10-CM | POA: Diagnosis present

## 2013-07-28 DIAGNOSIS — Z79899 Other long term (current) drug therapy: Secondary | ICD-10-CM | POA: Diagnosis not present

## 2013-07-28 DIAGNOSIS — S51809A Unspecified open wound of unspecified forearm, initial encounter: Secondary | ICD-10-CM | POA: Diagnosis not present

## 2013-07-28 DIAGNOSIS — I10 Essential (primary) hypertension: Secondary | ICD-10-CM | POA: Diagnosis present

## 2013-07-28 DIAGNOSIS — N179 Acute kidney failure, unspecified: Secondary | ICD-10-CM | POA: Diagnosis not present

## 2013-07-28 DIAGNOSIS — S59909A Unspecified injury of unspecified elbow, initial encounter: Secondary | ICD-10-CM | POA: Diagnosis not present

## 2013-07-28 DIAGNOSIS — M25529 Pain in unspecified elbow: Secondary | ICD-10-CM | POA: Diagnosis not present

## 2013-07-28 DIAGNOSIS — Z7982 Long term (current) use of aspirin: Secondary | ICD-10-CM | POA: Diagnosis not present

## 2013-07-28 DIAGNOSIS — Z5189 Encounter for other specified aftercare: Secondary | ICD-10-CM | POA: Diagnosis not present

## 2013-07-28 DIAGNOSIS — D72829 Elevated white blood cell count, unspecified: Secondary | ICD-10-CM | POA: Diagnosis not present

## 2013-07-28 DIAGNOSIS — M6281 Muscle weakness (generalized): Secondary | ICD-10-CM | POA: Diagnosis not present

## 2013-07-28 DIAGNOSIS — Z950 Presence of cardiac pacemaker: Secondary | ICD-10-CM | POA: Diagnosis not present

## 2013-07-28 DIAGNOSIS — Z9181 History of falling: Secondary | ICD-10-CM | POA: Diagnosis not present

## 2013-07-29 DIAGNOSIS — D72829 Elevated white blood cell count, unspecified: Secondary | ICD-10-CM | POA: Diagnosis not present

## 2013-07-29 DIAGNOSIS — S72453A Displaced supracondylar fracture without intracondylar extension of lower end of unspecified femur, initial encounter for closed fracture: Secondary | ICD-10-CM | POA: Diagnosis not present

## 2013-07-29 DIAGNOSIS — R262 Difficulty in walking, not elsewhere classified: Secondary | ICD-10-CM | POA: Diagnosis not present

## 2013-07-29 DIAGNOSIS — N179 Acute kidney failure, unspecified: Secondary | ICD-10-CM | POA: Diagnosis not present

## 2013-07-29 DIAGNOSIS — S7290XA Unspecified fracture of unspecified femur, initial encounter for closed fracture: Secondary | ICD-10-CM | POA: Diagnosis not present

## 2013-07-30 DIAGNOSIS — R262 Difficulty in walking, not elsewhere classified: Secondary | ICD-10-CM | POA: Diagnosis not present

## 2013-07-30 DIAGNOSIS — S7290XA Unspecified fracture of unspecified femur, initial encounter for closed fracture: Secondary | ICD-10-CM | POA: Diagnosis not present

## 2013-07-30 DIAGNOSIS — N179 Acute kidney failure, unspecified: Secondary | ICD-10-CM | POA: Diagnosis not present

## 2013-07-30 DIAGNOSIS — D72829 Elevated white blood cell count, unspecified: Secondary | ICD-10-CM | POA: Diagnosis not present

## 2013-07-31 DIAGNOSIS — E785 Hyperlipidemia, unspecified: Secondary | ICD-10-CM | POA: Diagnosis not present

## 2013-07-31 DIAGNOSIS — Z7401 Bed confinement status: Secondary | ICD-10-CM | POA: Diagnosis not present

## 2013-07-31 DIAGNOSIS — N179 Acute kidney failure, unspecified: Secondary | ICD-10-CM | POA: Diagnosis not present

## 2013-07-31 DIAGNOSIS — S72453A Displaced supracondylar fracture without intracondylar extension of lower end of unspecified femur, initial encounter for closed fracture: Secondary | ICD-10-CM | POA: Diagnosis not present

## 2013-07-31 DIAGNOSIS — S7290XA Unspecified fracture of unspecified femur, initial encounter for closed fracture: Secondary | ICD-10-CM | POA: Diagnosis not present

## 2013-07-31 DIAGNOSIS — K219 Gastro-esophageal reflux disease without esophagitis: Secondary | ICD-10-CM | POA: Diagnosis not present

## 2013-07-31 DIAGNOSIS — R279 Unspecified lack of coordination: Secondary | ICD-10-CM | POA: Diagnosis not present

## 2013-07-31 DIAGNOSIS — M8448XA Pathological fracture, other site, initial encounter for fracture: Secondary | ICD-10-CM | POA: Diagnosis not present

## 2013-07-31 DIAGNOSIS — S8290XD Unspecified fracture of unspecified lower leg, subsequent encounter for closed fracture with routine healing: Secondary | ICD-10-CM | POA: Diagnosis not present

## 2013-07-31 DIAGNOSIS — M1990 Unspecified osteoarthritis, unspecified site: Secondary | ICD-10-CM | POA: Diagnosis not present

## 2013-07-31 DIAGNOSIS — S72409A Unspecified fracture of lower end of unspecified femur, initial encounter for closed fracture: Secondary | ICD-10-CM | POA: Diagnosis not present

## 2013-07-31 DIAGNOSIS — D72829 Elevated white blood cell count, unspecified: Secondary | ICD-10-CM | POA: Diagnosis not present

## 2013-07-31 DIAGNOSIS — R262 Difficulty in walking, not elsewhere classified: Secondary | ICD-10-CM | POA: Diagnosis not present

## 2013-07-31 DIAGNOSIS — M6281 Muscle weakness (generalized): Secondary | ICD-10-CM | POA: Diagnosis not present

## 2013-07-31 DIAGNOSIS — Z950 Presence of cardiac pacemaker: Secondary | ICD-10-CM | POA: Diagnosis not present

## 2013-07-31 DIAGNOSIS — M2550 Pain in unspecified joint: Secondary | ICD-10-CM | POA: Diagnosis not present

## 2013-07-31 DIAGNOSIS — S41109A Unspecified open wound of unspecified upper arm, initial encounter: Secondary | ICD-10-CM | POA: Diagnosis not present

## 2013-07-31 DIAGNOSIS — I10 Essential (primary) hypertension: Secondary | ICD-10-CM | POA: Diagnosis not present

## 2013-07-31 DIAGNOSIS — Z9181 History of falling: Secondary | ICD-10-CM | POA: Diagnosis not present

## 2013-07-31 DIAGNOSIS — Z5189 Encounter for other specified aftercare: Secondary | ICD-10-CM | POA: Diagnosis not present

## 2013-07-31 DIAGNOSIS — D638 Anemia in other chronic diseases classified elsewhere: Secondary | ICD-10-CM | POA: Diagnosis not present

## 2013-08-04 DIAGNOSIS — M8448XA Pathological fracture, other site, initial encounter for fracture: Secondary | ICD-10-CM | POA: Diagnosis not present

## 2013-08-04 DIAGNOSIS — K219 Gastro-esophageal reflux disease without esophagitis: Secondary | ICD-10-CM | POA: Diagnosis not present

## 2013-08-04 DIAGNOSIS — S72409A Unspecified fracture of lower end of unspecified femur, initial encounter for closed fracture: Secondary | ICD-10-CM | POA: Diagnosis not present

## 2013-08-04 DIAGNOSIS — D638 Anemia in other chronic diseases classified elsewhere: Secondary | ICD-10-CM | POA: Diagnosis not present

## 2013-08-06 DIAGNOSIS — S72453A Displaced supracondylar fracture without intracondylar extension of lower end of unspecified femur, initial encounter for closed fracture: Secondary | ICD-10-CM | POA: Diagnosis not present

## 2013-08-14 DIAGNOSIS — S72453A Displaced supracondylar fracture without intracondylar extension of lower end of unspecified femur, initial encounter for closed fracture: Secondary | ICD-10-CM | POA: Diagnosis not present

## 2013-08-15 DIAGNOSIS — R279 Unspecified lack of coordination: Secondary | ICD-10-CM | POA: Diagnosis not present

## 2013-08-15 DIAGNOSIS — S8290XD Unspecified fracture of unspecified lower leg, subsequent encounter for closed fracture with routine healing: Secondary | ICD-10-CM | POA: Diagnosis not present

## 2013-08-15 DIAGNOSIS — R262 Difficulty in walking, not elsewhere classified: Secondary | ICD-10-CM | POA: Diagnosis not present

## 2013-08-15 DIAGNOSIS — S7290XA Unspecified fracture of unspecified femur, initial encounter for closed fracture: Secondary | ICD-10-CM | POA: Diagnosis not present

## 2013-08-15 DIAGNOSIS — M6281 Muscle weakness (generalized): Secondary | ICD-10-CM | POA: Diagnosis not present

## 2013-08-15 DIAGNOSIS — S72409A Unspecified fracture of lower end of unspecified femur, initial encounter for closed fracture: Secondary | ICD-10-CM | POA: Diagnosis not present

## 2013-08-18 DIAGNOSIS — N390 Urinary tract infection, site not specified: Secondary | ICD-10-CM | POA: Diagnosis not present

## 2013-08-18 DIAGNOSIS — R262 Difficulty in walking, not elsewhere classified: Secondary | ICD-10-CM | POA: Diagnosis not present

## 2013-08-18 DIAGNOSIS — S72409A Unspecified fracture of lower end of unspecified femur, initial encounter for closed fracture: Secondary | ICD-10-CM | POA: Diagnosis not present

## 2013-08-18 DIAGNOSIS — S7290XA Unspecified fracture of unspecified femur, initial encounter for closed fracture: Secondary | ICD-10-CM | POA: Diagnosis not present

## 2013-08-18 DIAGNOSIS — R350 Frequency of micturition: Secondary | ICD-10-CM | POA: Diagnosis not present

## 2013-08-18 DIAGNOSIS — R279 Unspecified lack of coordination: Secondary | ICD-10-CM | POA: Diagnosis not present

## 2013-08-18 DIAGNOSIS — M6281 Muscle weakness (generalized): Secondary | ICD-10-CM | POA: Diagnosis not present

## 2013-08-18 DIAGNOSIS — R300 Dysuria: Secondary | ICD-10-CM | POA: Diagnosis not present

## 2013-08-18 DIAGNOSIS — S8290XD Unspecified fracture of unspecified lower leg, subsequent encounter for closed fracture with routine healing: Secondary | ICD-10-CM | POA: Diagnosis not present

## 2013-08-19 DIAGNOSIS — R262 Difficulty in walking, not elsewhere classified: Secondary | ICD-10-CM | POA: Diagnosis not present

## 2013-08-19 DIAGNOSIS — S8290XD Unspecified fracture of unspecified lower leg, subsequent encounter for closed fracture with routine healing: Secondary | ICD-10-CM | POA: Diagnosis not present

## 2013-08-19 DIAGNOSIS — S72409A Unspecified fracture of lower end of unspecified femur, initial encounter for closed fracture: Secondary | ICD-10-CM | POA: Diagnosis not present

## 2013-08-19 DIAGNOSIS — R279 Unspecified lack of coordination: Secondary | ICD-10-CM | POA: Diagnosis not present

## 2013-08-19 DIAGNOSIS — M6281 Muscle weakness (generalized): Secondary | ICD-10-CM | POA: Diagnosis not present

## 2013-08-19 DIAGNOSIS — S7290XA Unspecified fracture of unspecified femur, initial encounter for closed fracture: Secondary | ICD-10-CM | POA: Diagnosis not present

## 2013-08-20 DIAGNOSIS — M6281 Muscle weakness (generalized): Secondary | ICD-10-CM | POA: Diagnosis not present

## 2013-08-20 DIAGNOSIS — S7290XA Unspecified fracture of unspecified femur, initial encounter for closed fracture: Secondary | ICD-10-CM | POA: Diagnosis not present

## 2013-08-20 DIAGNOSIS — R279 Unspecified lack of coordination: Secondary | ICD-10-CM | POA: Diagnosis not present

## 2013-08-20 DIAGNOSIS — S72409A Unspecified fracture of lower end of unspecified femur, initial encounter for closed fracture: Secondary | ICD-10-CM | POA: Diagnosis not present

## 2013-08-20 DIAGNOSIS — S8290XD Unspecified fracture of unspecified lower leg, subsequent encounter for closed fracture with routine healing: Secondary | ICD-10-CM | POA: Diagnosis not present

## 2013-08-20 DIAGNOSIS — R262 Difficulty in walking, not elsewhere classified: Secondary | ICD-10-CM | POA: Diagnosis not present

## 2013-08-21 DIAGNOSIS — S72409A Unspecified fracture of lower end of unspecified femur, initial encounter for closed fracture: Secondary | ICD-10-CM | POA: Diagnosis not present

## 2013-08-21 DIAGNOSIS — R262 Difficulty in walking, not elsewhere classified: Secondary | ICD-10-CM | POA: Diagnosis not present

## 2013-08-21 DIAGNOSIS — S8290XD Unspecified fracture of unspecified lower leg, subsequent encounter for closed fracture with routine healing: Secondary | ICD-10-CM | POA: Diagnosis not present

## 2013-08-21 DIAGNOSIS — S7290XA Unspecified fracture of unspecified femur, initial encounter for closed fracture: Secondary | ICD-10-CM | POA: Diagnosis not present

## 2013-08-21 DIAGNOSIS — M6281 Muscle weakness (generalized): Secondary | ICD-10-CM | POA: Diagnosis not present

## 2013-08-21 DIAGNOSIS — R279 Unspecified lack of coordination: Secondary | ICD-10-CM | POA: Diagnosis not present

## 2013-08-22 DIAGNOSIS — R279 Unspecified lack of coordination: Secondary | ICD-10-CM | POA: Diagnosis not present

## 2013-08-22 DIAGNOSIS — S72409A Unspecified fracture of lower end of unspecified femur, initial encounter for closed fracture: Secondary | ICD-10-CM | POA: Diagnosis not present

## 2013-08-22 DIAGNOSIS — S7290XA Unspecified fracture of unspecified femur, initial encounter for closed fracture: Secondary | ICD-10-CM | POA: Diagnosis not present

## 2013-08-22 DIAGNOSIS — M6281 Muscle weakness (generalized): Secondary | ICD-10-CM | POA: Diagnosis not present

## 2013-08-22 DIAGNOSIS — R262 Difficulty in walking, not elsewhere classified: Secondary | ICD-10-CM | POA: Diagnosis not present

## 2013-08-22 DIAGNOSIS — S8290XD Unspecified fracture of unspecified lower leg, subsequent encounter for closed fracture with routine healing: Secondary | ICD-10-CM | POA: Diagnosis not present

## 2013-08-25 DIAGNOSIS — S7290XA Unspecified fracture of unspecified femur, initial encounter for closed fracture: Secondary | ICD-10-CM | POA: Diagnosis not present

## 2013-08-25 DIAGNOSIS — S72409A Unspecified fracture of lower end of unspecified femur, initial encounter for closed fracture: Secondary | ICD-10-CM | POA: Diagnosis not present

## 2013-08-25 DIAGNOSIS — R262 Difficulty in walking, not elsewhere classified: Secondary | ICD-10-CM | POA: Diagnosis not present

## 2013-08-25 DIAGNOSIS — R279 Unspecified lack of coordination: Secondary | ICD-10-CM | POA: Diagnosis not present

## 2013-08-25 DIAGNOSIS — S8290XD Unspecified fracture of unspecified lower leg, subsequent encounter for closed fracture with routine healing: Secondary | ICD-10-CM | POA: Diagnosis not present

## 2013-08-25 DIAGNOSIS — M6281 Muscle weakness (generalized): Secondary | ICD-10-CM | POA: Diagnosis not present

## 2013-08-26 DIAGNOSIS — M6281 Muscle weakness (generalized): Secondary | ICD-10-CM | POA: Diagnosis not present

## 2013-08-26 DIAGNOSIS — R262 Difficulty in walking, not elsewhere classified: Secondary | ICD-10-CM | POA: Diagnosis not present

## 2013-08-26 DIAGNOSIS — S7290XA Unspecified fracture of unspecified femur, initial encounter for closed fracture: Secondary | ICD-10-CM | POA: Diagnosis not present

## 2013-08-26 DIAGNOSIS — R279 Unspecified lack of coordination: Secondary | ICD-10-CM | POA: Diagnosis not present

## 2013-08-26 DIAGNOSIS — Z79899 Other long term (current) drug therapy: Secondary | ICD-10-CM | POA: Diagnosis not present

## 2013-08-26 DIAGNOSIS — Z950 Presence of cardiac pacemaker: Secondary | ICD-10-CM | POA: Diagnosis not present

## 2013-08-26 DIAGNOSIS — S72409A Unspecified fracture of lower end of unspecified femur, initial encounter for closed fracture: Secondary | ICD-10-CM | POA: Diagnosis not present

## 2013-08-26 DIAGNOSIS — E7800 Pure hypercholesterolemia, unspecified: Secondary | ICD-10-CM | POA: Diagnosis not present

## 2013-08-26 DIAGNOSIS — S8290XD Unspecified fracture of unspecified lower leg, subsequent encounter for closed fracture with routine healing: Secondary | ICD-10-CM | POA: Diagnosis not present

## 2013-08-26 DIAGNOSIS — M810 Age-related osteoporosis without current pathological fracture: Secondary | ICD-10-CM | POA: Diagnosis not present

## 2013-08-26 DIAGNOSIS — R350 Frequency of micturition: Secondary | ICD-10-CM | POA: Diagnosis not present

## 2013-08-26 DIAGNOSIS — M479 Spondylosis, unspecified: Secondary | ICD-10-CM | POA: Diagnosis not present

## 2013-08-26 DIAGNOSIS — I10 Essential (primary) hypertension: Secondary | ICD-10-CM | POA: Diagnosis not present

## 2013-08-27 DIAGNOSIS — R279 Unspecified lack of coordination: Secondary | ICD-10-CM | POA: Diagnosis not present

## 2013-08-27 DIAGNOSIS — R262 Difficulty in walking, not elsewhere classified: Secondary | ICD-10-CM | POA: Diagnosis not present

## 2013-08-27 DIAGNOSIS — M6281 Muscle weakness (generalized): Secondary | ICD-10-CM | POA: Diagnosis not present

## 2013-08-27 DIAGNOSIS — S7290XA Unspecified fracture of unspecified femur, initial encounter for closed fracture: Secondary | ICD-10-CM | POA: Diagnosis not present

## 2013-08-27 DIAGNOSIS — S72453A Displaced supracondylar fracture without intracondylar extension of lower end of unspecified femur, initial encounter for closed fracture: Secondary | ICD-10-CM | POA: Diagnosis not present

## 2013-08-27 DIAGNOSIS — S8290XD Unspecified fracture of unspecified lower leg, subsequent encounter for closed fracture with routine healing: Secondary | ICD-10-CM | POA: Diagnosis not present

## 2013-08-27 DIAGNOSIS — S72409A Unspecified fracture of lower end of unspecified femur, initial encounter for closed fracture: Secondary | ICD-10-CM | POA: Diagnosis not present

## 2013-08-29 DIAGNOSIS — R262 Difficulty in walking, not elsewhere classified: Secondary | ICD-10-CM | POA: Diagnosis not present

## 2013-08-29 DIAGNOSIS — M6281 Muscle weakness (generalized): Secondary | ICD-10-CM | POA: Diagnosis not present

## 2013-08-29 DIAGNOSIS — S8290XD Unspecified fracture of unspecified lower leg, subsequent encounter for closed fracture with routine healing: Secondary | ICD-10-CM | POA: Diagnosis not present

## 2013-08-29 DIAGNOSIS — R279 Unspecified lack of coordination: Secondary | ICD-10-CM | POA: Diagnosis not present

## 2013-08-29 DIAGNOSIS — S7290XA Unspecified fracture of unspecified femur, initial encounter for closed fracture: Secondary | ICD-10-CM | POA: Diagnosis not present

## 2013-08-29 DIAGNOSIS — S72409A Unspecified fracture of lower end of unspecified femur, initial encounter for closed fracture: Secondary | ICD-10-CM | POA: Diagnosis not present

## 2013-08-30 DIAGNOSIS — S058X9A Other injuries of unspecified eye and orbit, initial encounter: Secondary | ICD-10-CM | POA: Diagnosis not present

## 2013-08-30 DIAGNOSIS — T1500XA Foreign body in cornea, unspecified eye, initial encounter: Secondary | ICD-10-CM | POA: Diagnosis not present

## 2013-08-30 DIAGNOSIS — X58XXXA Exposure to other specified factors, initial encounter: Secondary | ICD-10-CM | POA: Diagnosis not present

## 2013-09-10 DIAGNOSIS — S72453A Displaced supracondylar fracture without intracondylar extension of lower end of unspecified femur, initial encounter for closed fracture: Secondary | ICD-10-CM | POA: Diagnosis not present

## 2013-09-22 DIAGNOSIS — J069 Acute upper respiratory infection, unspecified: Secondary | ICD-10-CM | POA: Diagnosis not present

## 2013-09-22 DIAGNOSIS — R05 Cough: Secondary | ICD-10-CM | POA: Diagnosis not present

## 2013-09-23 DIAGNOSIS — S72453A Displaced supracondylar fracture without intracondylar extension of lower end of unspecified femur, initial encounter for closed fracture: Secondary | ICD-10-CM | POA: Diagnosis not present

## 2013-09-27 DIAGNOSIS — Z792 Long term (current) use of antibiotics: Secondary | ICD-10-CM | POA: Diagnosis not present

## 2013-09-27 DIAGNOSIS — R059 Cough, unspecified: Secondary | ICD-10-CM | POA: Diagnosis not present

## 2013-09-27 DIAGNOSIS — M810 Age-related osteoporosis without current pathological fracture: Secondary | ICD-10-CM | POA: Diagnosis not present

## 2013-09-27 DIAGNOSIS — J438 Other emphysema: Secondary | ICD-10-CM | POA: Diagnosis not present

## 2013-09-27 DIAGNOSIS — E7800 Pure hypercholesterolemia, unspecified: Secondary | ICD-10-CM | POA: Diagnosis not present

## 2013-09-27 DIAGNOSIS — I10 Essential (primary) hypertension: Secondary | ICD-10-CM | POA: Diagnosis not present

## 2013-09-27 DIAGNOSIS — S2249XA Multiple fractures of ribs, unspecified side, initial encounter for closed fracture: Secondary | ICD-10-CM | POA: Diagnosis not present

## 2013-09-27 DIAGNOSIS — R05 Cough: Secondary | ICD-10-CM | POA: Diagnosis not present

## 2013-09-27 DIAGNOSIS — IMO0002 Reserved for concepts with insufficient information to code with codable children: Secondary | ICD-10-CM | POA: Diagnosis not present

## 2013-09-27 DIAGNOSIS — Z79899 Other long term (current) drug therapy: Secondary | ICD-10-CM | POA: Diagnosis not present

## 2013-09-27 DIAGNOSIS — Z7982 Long term (current) use of aspirin: Secondary | ICD-10-CM | POA: Diagnosis not present

## 2013-09-27 DIAGNOSIS — Z950 Presence of cardiac pacemaker: Secondary | ICD-10-CM | POA: Diagnosis not present

## 2013-09-29 DIAGNOSIS — M6281 Muscle weakness (generalized): Secondary | ICD-10-CM | POA: Diagnosis not present

## 2013-09-29 DIAGNOSIS — S8290XD Unspecified fracture of unspecified lower leg, subsequent encounter for closed fracture with routine healing: Secondary | ICD-10-CM | POA: Diagnosis not present

## 2013-09-29 DIAGNOSIS — R262 Difficulty in walking, not elsewhere classified: Secondary | ICD-10-CM | POA: Diagnosis not present

## 2013-09-30 DIAGNOSIS — S8290XD Unspecified fracture of unspecified lower leg, subsequent encounter for closed fracture with routine healing: Secondary | ICD-10-CM | POA: Diagnosis not present

## 2013-09-30 DIAGNOSIS — M6281 Muscle weakness (generalized): Secondary | ICD-10-CM | POA: Diagnosis not present

## 2013-09-30 DIAGNOSIS — R262 Difficulty in walking, not elsewhere classified: Secondary | ICD-10-CM | POA: Diagnosis not present

## 2013-10-01 DIAGNOSIS — M6281 Muscle weakness (generalized): Secondary | ICD-10-CM | POA: Diagnosis not present

## 2013-10-01 DIAGNOSIS — R262 Difficulty in walking, not elsewhere classified: Secondary | ICD-10-CM | POA: Diagnosis not present

## 2013-10-01 DIAGNOSIS — S8290XD Unspecified fracture of unspecified lower leg, subsequent encounter for closed fracture with routine healing: Secondary | ICD-10-CM | POA: Diagnosis not present

## 2013-10-02 DIAGNOSIS — M6281 Muscle weakness (generalized): Secondary | ICD-10-CM | POA: Diagnosis not present

## 2013-10-02 DIAGNOSIS — S8290XD Unspecified fracture of unspecified lower leg, subsequent encounter for closed fracture with routine healing: Secondary | ICD-10-CM | POA: Diagnosis not present

## 2013-10-02 DIAGNOSIS — R262 Difficulty in walking, not elsewhere classified: Secondary | ICD-10-CM | POA: Diagnosis not present

## 2013-10-03 DIAGNOSIS — R262 Difficulty in walking, not elsewhere classified: Secondary | ICD-10-CM | POA: Diagnosis not present

## 2013-10-03 DIAGNOSIS — M6281 Muscle weakness (generalized): Secondary | ICD-10-CM | POA: Diagnosis not present

## 2013-10-03 DIAGNOSIS — S8290XD Unspecified fracture of unspecified lower leg, subsequent encounter for closed fracture with routine healing: Secondary | ICD-10-CM | POA: Diagnosis not present

## 2013-10-06 DIAGNOSIS — S8290XD Unspecified fracture of unspecified lower leg, subsequent encounter for closed fracture with routine healing: Secondary | ICD-10-CM | POA: Diagnosis not present

## 2013-10-06 DIAGNOSIS — M6281 Muscle weakness (generalized): Secondary | ICD-10-CM | POA: Diagnosis not present

## 2013-10-06 DIAGNOSIS — R262 Difficulty in walking, not elsewhere classified: Secondary | ICD-10-CM | POA: Diagnosis not present

## 2013-10-07 DIAGNOSIS — R262 Difficulty in walking, not elsewhere classified: Secondary | ICD-10-CM | POA: Diagnosis not present

## 2013-10-07 DIAGNOSIS — M6281 Muscle weakness (generalized): Secondary | ICD-10-CM | POA: Diagnosis not present

## 2013-10-07 DIAGNOSIS — S8290XD Unspecified fracture of unspecified lower leg, subsequent encounter for closed fracture with routine healing: Secondary | ICD-10-CM | POA: Diagnosis not present

## 2013-10-08 DIAGNOSIS — S8290XD Unspecified fracture of unspecified lower leg, subsequent encounter for closed fracture with routine healing: Secondary | ICD-10-CM | POA: Diagnosis not present

## 2013-10-08 DIAGNOSIS — M6281 Muscle weakness (generalized): Secondary | ICD-10-CM | POA: Diagnosis not present

## 2013-10-08 DIAGNOSIS — R262 Difficulty in walking, not elsewhere classified: Secondary | ICD-10-CM | POA: Diagnosis not present

## 2013-10-09 DIAGNOSIS — R262 Difficulty in walking, not elsewhere classified: Secondary | ICD-10-CM | POA: Diagnosis not present

## 2013-10-09 DIAGNOSIS — M6281 Muscle weakness (generalized): Secondary | ICD-10-CM | POA: Diagnosis not present

## 2013-10-09 DIAGNOSIS — S8290XD Unspecified fracture of unspecified lower leg, subsequent encounter for closed fracture with routine healing: Secondary | ICD-10-CM | POA: Diagnosis not present

## 2013-10-10 DIAGNOSIS — M6281 Muscle weakness (generalized): Secondary | ICD-10-CM | POA: Diagnosis not present

## 2013-10-10 DIAGNOSIS — R262 Difficulty in walking, not elsewhere classified: Secondary | ICD-10-CM | POA: Diagnosis not present

## 2013-10-10 DIAGNOSIS — S8290XD Unspecified fracture of unspecified lower leg, subsequent encounter for closed fracture with routine healing: Secondary | ICD-10-CM | POA: Diagnosis not present

## 2013-10-13 DIAGNOSIS — R262 Difficulty in walking, not elsewhere classified: Secondary | ICD-10-CM | POA: Diagnosis not present

## 2013-10-13 DIAGNOSIS — M6281 Muscle weakness (generalized): Secondary | ICD-10-CM | POA: Diagnosis not present

## 2013-10-13 DIAGNOSIS — S8290XD Unspecified fracture of unspecified lower leg, subsequent encounter for closed fracture with routine healing: Secondary | ICD-10-CM | POA: Diagnosis not present

## 2013-10-14 DIAGNOSIS — S8290XD Unspecified fracture of unspecified lower leg, subsequent encounter for closed fracture with routine healing: Secondary | ICD-10-CM | POA: Diagnosis not present

## 2013-10-14 DIAGNOSIS — R262 Difficulty in walking, not elsewhere classified: Secondary | ICD-10-CM | POA: Diagnosis not present

## 2013-10-14 DIAGNOSIS — M6281 Muscle weakness (generalized): Secondary | ICD-10-CM | POA: Diagnosis not present

## 2013-10-15 DIAGNOSIS — R262 Difficulty in walking, not elsewhere classified: Secondary | ICD-10-CM | POA: Diagnosis not present

## 2013-10-15 DIAGNOSIS — S8290XD Unspecified fracture of unspecified lower leg, subsequent encounter for closed fracture with routine healing: Secondary | ICD-10-CM | POA: Diagnosis not present

## 2013-10-15 DIAGNOSIS — M6281 Muscle weakness (generalized): Secondary | ICD-10-CM | POA: Diagnosis not present

## 2013-10-16 DIAGNOSIS — R262 Difficulty in walking, not elsewhere classified: Secondary | ICD-10-CM | POA: Diagnosis not present

## 2013-10-16 DIAGNOSIS — M6281 Muscle weakness (generalized): Secondary | ICD-10-CM | POA: Diagnosis not present

## 2013-10-16 DIAGNOSIS — S8290XD Unspecified fracture of unspecified lower leg, subsequent encounter for closed fracture with routine healing: Secondary | ICD-10-CM | POA: Diagnosis not present

## 2013-10-17 DIAGNOSIS — S8290XD Unspecified fracture of unspecified lower leg, subsequent encounter for closed fracture with routine healing: Secondary | ICD-10-CM | POA: Diagnosis not present

## 2013-10-17 DIAGNOSIS — R262 Difficulty in walking, not elsewhere classified: Secondary | ICD-10-CM | POA: Diagnosis not present

## 2013-10-17 DIAGNOSIS — M6281 Muscle weakness (generalized): Secondary | ICD-10-CM | POA: Diagnosis not present

## 2013-10-20 DIAGNOSIS — L02419 Cutaneous abscess of limb, unspecified: Secondary | ICD-10-CM | POA: Diagnosis not present

## 2013-10-20 DIAGNOSIS — M1710 Unilateral primary osteoarthritis, unspecified knee: Secondary | ICD-10-CM | POA: Diagnosis not present

## 2013-10-20 DIAGNOSIS — IMO0002 Reserved for concepts with insufficient information to code with codable children: Secondary | ICD-10-CM | POA: Diagnosis not present

## 2013-10-20 DIAGNOSIS — L03119 Cellulitis of unspecified part of limb: Secondary | ICD-10-CM | POA: Diagnosis not present

## 2013-10-27 DIAGNOSIS — L02419 Cutaneous abscess of limb, unspecified: Secondary | ICD-10-CM | POA: Diagnosis not present

## 2013-10-27 DIAGNOSIS — M1710 Unilateral primary osteoarthritis, unspecified knee: Secondary | ICD-10-CM | POA: Diagnosis not present

## 2013-11-24 DIAGNOSIS — Z950 Presence of cardiac pacemaker: Secondary | ICD-10-CM | POA: Diagnosis not present

## 2013-11-24 DIAGNOSIS — E785 Hyperlipidemia, unspecified: Secondary | ICD-10-CM | POA: Diagnosis not present

## 2013-11-24 DIAGNOSIS — Z79899 Other long term (current) drug therapy: Secondary | ICD-10-CM | POA: Diagnosis not present

## 2013-11-24 DIAGNOSIS — I4891 Unspecified atrial fibrillation: Secondary | ICD-10-CM | POA: Diagnosis not present

## 2013-12-08 DIAGNOSIS — L02419 Cutaneous abscess of limb, unspecified: Secondary | ICD-10-CM | POA: Diagnosis not present

## 2013-12-08 DIAGNOSIS — M25569 Pain in unspecified knee: Secondary | ICD-10-CM | POA: Diagnosis not present

## 2013-12-08 DIAGNOSIS — M1710 Unilateral primary osteoarthritis, unspecified knee: Secondary | ICD-10-CM | POA: Diagnosis not present

## 2014-01-06 DIAGNOSIS — M159 Polyosteoarthritis, unspecified: Secondary | ICD-10-CM | POA: Diagnosis not present

## 2014-01-06 DIAGNOSIS — Z79899 Other long term (current) drug therapy: Secondary | ICD-10-CM | POA: Diagnosis not present

## 2014-01-06 DIAGNOSIS — Z1331 Encounter for screening for depression: Secondary | ICD-10-CM | POA: Diagnosis not present

## 2014-01-06 DIAGNOSIS — Z9181 History of falling: Secondary | ICD-10-CM | POA: Diagnosis not present

## 2014-01-06 DIAGNOSIS — J309 Allergic rhinitis, unspecified: Secondary | ICD-10-CM | POA: Diagnosis not present

## 2014-01-27 DIAGNOSIS — J019 Acute sinusitis, unspecified: Secondary | ICD-10-CM | POA: Diagnosis not present

## 2014-02-17 DIAGNOSIS — L570 Actinic keratosis: Secondary | ICD-10-CM | POA: Diagnosis not present

## 2014-02-17 DIAGNOSIS — L219 Seborrheic dermatitis, unspecified: Secondary | ICD-10-CM | POA: Diagnosis not present

## 2014-02-17 DIAGNOSIS — L821 Other seborrheic keratosis: Secondary | ICD-10-CM | POA: Diagnosis not present

## 2014-03-18 DIAGNOSIS — IMO0002 Reserved for concepts with insufficient information to code with codable children: Secondary | ICD-10-CM | POA: Diagnosis not present

## 2014-03-18 DIAGNOSIS — M25569 Pain in unspecified knee: Secondary | ICD-10-CM | POA: Diagnosis not present

## 2014-03-18 DIAGNOSIS — M1710 Unilateral primary osteoarthritis, unspecified knee: Secondary | ICD-10-CM | POA: Diagnosis not present

## 2014-03-24 DIAGNOSIS — M1710 Unilateral primary osteoarthritis, unspecified knee: Secondary | ICD-10-CM | POA: Diagnosis not present

## 2014-03-25 DIAGNOSIS — Z0000 Encounter for general adult medical examination without abnormal findings: Secondary | ICD-10-CM | POA: Diagnosis not present

## 2014-03-25 DIAGNOSIS — Z79899 Other long term (current) drug therapy: Secondary | ICD-10-CM | POA: Diagnosis not present

## 2014-03-25 DIAGNOSIS — Z9181 History of falling: Secondary | ICD-10-CM | POA: Diagnosis not present

## 2014-03-25 DIAGNOSIS — H6120 Impacted cerumen, unspecified ear: Secondary | ICD-10-CM | POA: Diagnosis not present

## 2014-03-25 DIAGNOSIS — E7800 Pure hypercholesterolemia, unspecified: Secondary | ICD-10-CM | POA: Diagnosis not present

## 2014-03-25 DIAGNOSIS — Z1331 Encounter for screening for depression: Secondary | ICD-10-CM | POA: Diagnosis not present

## 2014-04-01 DIAGNOSIS — M25569 Pain in unspecified knee: Secondary | ICD-10-CM | POA: Diagnosis not present

## 2014-04-01 DIAGNOSIS — M1710 Unilateral primary osteoarthritis, unspecified knee: Secondary | ICD-10-CM | POA: Diagnosis not present

## 2014-04-08 DIAGNOSIS — M1710 Unilateral primary osteoarthritis, unspecified knee: Secondary | ICD-10-CM | POA: Diagnosis not present

## 2014-04-15 DIAGNOSIS — Z950 Presence of cardiac pacemaker: Secondary | ICD-10-CM | POA: Diagnosis not present

## 2014-04-15 DIAGNOSIS — J438 Other emphysema: Secondary | ICD-10-CM | POA: Diagnosis not present

## 2014-04-15 DIAGNOSIS — J069 Acute upper respiratory infection, unspecified: Secondary | ICD-10-CM | POA: Diagnosis not present

## 2014-04-15 DIAGNOSIS — R42 Dizziness and giddiness: Secondary | ICD-10-CM | POA: Diagnosis not present

## 2014-04-15 DIAGNOSIS — R5381 Other malaise: Secondary | ICD-10-CM | POA: Diagnosis not present

## 2014-04-15 DIAGNOSIS — R51 Headache: Secondary | ICD-10-CM | POA: Diagnosis not present

## 2014-05-13 DIAGNOSIS — Z950 Presence of cardiac pacemaker: Secondary | ICD-10-CM | POA: Diagnosis not present

## 2014-05-13 DIAGNOSIS — I4891 Unspecified atrial fibrillation: Secondary | ICD-10-CM | POA: Diagnosis not present

## 2014-05-13 DIAGNOSIS — Z79899 Other long term (current) drug therapy: Secondary | ICD-10-CM | POA: Diagnosis not present

## 2014-05-13 DIAGNOSIS — E785 Hyperlipidemia, unspecified: Secondary | ICD-10-CM | POA: Diagnosis not present

## 2014-05-18 DIAGNOSIS — J310 Chronic rhinitis: Secondary | ICD-10-CM | POA: Diagnosis not present

## 2014-05-20 DIAGNOSIS — M25569 Pain in unspecified knee: Secondary | ICD-10-CM | POA: Diagnosis not present

## 2014-05-20 DIAGNOSIS — M1710 Unilateral primary osteoarthritis, unspecified knee: Secondary | ICD-10-CM | POA: Diagnosis not present

## 2014-06-15 DIAGNOSIS — M542 Cervicalgia: Secondary | ICD-10-CM | POA: Diagnosis not present

## 2014-06-15 DIAGNOSIS — J309 Allergic rhinitis, unspecified: Secondary | ICD-10-CM | POA: Diagnosis not present

## 2014-06-15 DIAGNOSIS — R42 Dizziness and giddiness: Secondary | ICD-10-CM | POA: Diagnosis not present

## 2014-06-15 DIAGNOSIS — IMO0002 Reserved for concepts with insufficient information to code with codable children: Secondary | ICD-10-CM | POA: Diagnosis not present

## 2014-07-01 DIAGNOSIS — E538 Deficiency of other specified B group vitamins: Secondary | ICD-10-CM | POA: Diagnosis not present

## 2014-07-01 DIAGNOSIS — F0390 Unspecified dementia without behavioral disturbance: Secondary | ICD-10-CM | POA: Diagnosis not present

## 2014-07-01 DIAGNOSIS — Z79899 Other long term (current) drug therapy: Secondary | ICD-10-CM | POA: Diagnosis not present

## 2014-07-01 DIAGNOSIS — M159 Polyosteoarthritis, unspecified: Secondary | ICD-10-CM | POA: Diagnosis not present

## 2014-07-14 DIAGNOSIS — M25562 Pain in left knee: Secondary | ICD-10-CM | POA: Diagnosis not present

## 2014-07-14 DIAGNOSIS — M1712 Unilateral primary osteoarthritis, left knee: Secondary | ICD-10-CM | POA: Diagnosis not present

## 2014-07-23 DIAGNOSIS — G4762 Sleep related leg cramps: Secondary | ICD-10-CM | POA: Diagnosis not present

## 2014-07-23 DIAGNOSIS — I739 Peripheral vascular disease, unspecified: Secondary | ICD-10-CM | POA: Diagnosis not present

## 2014-07-30 DIAGNOSIS — L728 Other follicular cysts of the skin and subcutaneous tissue: Secondary | ICD-10-CM | POA: Diagnosis not present

## 2014-07-30 DIAGNOSIS — L820 Inflamed seborrheic keratosis: Secondary | ICD-10-CM | POA: Diagnosis not present

## 2014-07-30 DIAGNOSIS — L814 Other melanin hyperpigmentation: Secondary | ICD-10-CM | POA: Diagnosis not present

## 2014-08-02 DIAGNOSIS — E780 Pure hypercholesterolemia: Secondary | ICD-10-CM | POA: Diagnosis not present

## 2014-08-02 DIAGNOSIS — R1032 Left lower quadrant pain: Secondary | ICD-10-CM | POA: Diagnosis not present

## 2014-08-02 DIAGNOSIS — R110 Nausea: Secondary | ICD-10-CM | POA: Diagnosis not present

## 2014-08-02 DIAGNOSIS — K5790 Diverticulosis of intestine, part unspecified, without perforation or abscess without bleeding: Secondary | ICD-10-CM | POA: Diagnosis not present

## 2014-08-02 DIAGNOSIS — I10 Essential (primary) hypertension: Secondary | ICD-10-CM | POA: Diagnosis not present

## 2014-08-02 DIAGNOSIS — Z7982 Long term (current) use of aspirin: Secondary | ICD-10-CM | POA: Diagnosis not present

## 2014-08-04 DIAGNOSIS — M549 Dorsalgia, unspecified: Secondary | ICD-10-CM | POA: Diagnosis not present

## 2014-08-04 DIAGNOSIS — R350 Frequency of micturition: Secondary | ICD-10-CM | POA: Diagnosis not present

## 2014-08-04 DIAGNOSIS — Z79899 Other long term (current) drug therapy: Secondary | ICD-10-CM | POA: Diagnosis not present

## 2014-08-04 DIAGNOSIS — K5900 Constipation, unspecified: Secondary | ICD-10-CM | POA: Diagnosis not present

## 2014-08-09 DIAGNOSIS — Z7982 Long term (current) use of aspirin: Secondary | ICD-10-CM | POA: Diagnosis not present

## 2014-08-09 DIAGNOSIS — S39012A Strain of muscle, fascia and tendon of lower back, initial encounter: Secondary | ICD-10-CM | POA: Diagnosis not present

## 2014-08-09 DIAGNOSIS — X58XXXA Exposure to other specified factors, initial encounter: Secondary | ICD-10-CM | POA: Diagnosis not present

## 2014-08-09 DIAGNOSIS — K529 Noninfective gastroenteritis and colitis, unspecified: Secondary | ICD-10-CM | POA: Diagnosis not present

## 2014-08-09 DIAGNOSIS — S335XXA Sprain of ligaments of lumbar spine, initial encounter: Secondary | ICD-10-CM | POA: Diagnosis not present

## 2014-08-09 DIAGNOSIS — E780 Pure hypercholesterolemia: Secondary | ICD-10-CM | POA: Diagnosis not present

## 2014-08-09 DIAGNOSIS — R1032 Left lower quadrant pain: Secondary | ICD-10-CM | POA: Diagnosis not present

## 2014-08-09 DIAGNOSIS — I10 Essential (primary) hypertension: Secondary | ICD-10-CM | POA: Diagnosis not present

## 2014-08-13 DIAGNOSIS — K5900 Constipation, unspecified: Secondary | ICD-10-CM | POA: Diagnosis not present

## 2014-08-13 DIAGNOSIS — M549 Dorsalgia, unspecified: Secondary | ICD-10-CM | POA: Diagnosis not present

## 2014-08-13 DIAGNOSIS — R109 Unspecified abdominal pain: Secondary | ICD-10-CM | POA: Diagnosis not present

## 2014-08-28 DIAGNOSIS — K5900 Constipation, unspecified: Secondary | ICD-10-CM | POA: Diagnosis not present

## 2014-08-28 DIAGNOSIS — R1030 Lower abdominal pain, unspecified: Secondary | ICD-10-CM | POA: Diagnosis not present

## 2014-08-28 DIAGNOSIS — R1900 Intra-abdominal and pelvic swelling, mass and lump, unspecified site: Secondary | ICD-10-CM | POA: Diagnosis not present

## 2014-08-28 DIAGNOSIS — R109 Unspecified abdominal pain: Secondary | ICD-10-CM | POA: Diagnosis not present

## 2014-08-28 DIAGNOSIS — M4856XA Collapsed vertebra, not elsewhere classified, lumbar region, initial encounter for fracture: Secondary | ICD-10-CM | POA: Diagnosis not present

## 2014-08-31 DIAGNOSIS — R1084 Generalized abdominal pain: Secondary | ICD-10-CM | POA: Diagnosis not present

## 2014-08-31 DIAGNOSIS — S32000A Wedge compression fracture of unspecified lumbar vertebra, initial encounter for closed fracture: Secondary | ICD-10-CM | POA: Diagnosis not present

## 2014-09-01 DIAGNOSIS — H26493 Other secondary cataract, bilateral: Secondary | ICD-10-CM | POA: Diagnosis not present

## 2014-09-01 DIAGNOSIS — H3530 Unspecified macular degeneration: Secondary | ICD-10-CM | POA: Diagnosis not present

## 2014-09-04 DIAGNOSIS — K5900 Constipation, unspecified: Secondary | ICD-10-CM | POA: Diagnosis not present

## 2014-09-04 DIAGNOSIS — R1013 Epigastric pain: Secondary | ICD-10-CM | POA: Diagnosis not present

## 2014-09-08 DIAGNOSIS — S32000A Wedge compression fracture of unspecified lumbar vertebra, initial encounter for closed fracture: Secondary | ICD-10-CM | POA: Diagnosis not present

## 2014-09-09 DIAGNOSIS — I4891 Unspecified atrial fibrillation: Secondary | ICD-10-CM | POA: Diagnosis not present

## 2014-09-09 DIAGNOSIS — Z79899 Other long term (current) drug therapy: Secondary | ICD-10-CM | POA: Diagnosis not present

## 2014-09-09 DIAGNOSIS — Z950 Presence of cardiac pacemaker: Secondary | ICD-10-CM | POA: Diagnosis not present

## 2014-09-09 DIAGNOSIS — Z01810 Encounter for preprocedural cardiovascular examination: Secondary | ICD-10-CM | POA: Diagnosis not present

## 2014-09-09 DIAGNOSIS — E785 Hyperlipidemia, unspecified: Secondary | ICD-10-CM | POA: Diagnosis not present

## 2014-09-10 DIAGNOSIS — I361 Nonrheumatic tricuspid (valve) insufficiency: Secondary | ICD-10-CM | POA: Diagnosis not present

## 2014-09-10 DIAGNOSIS — Z01810 Encounter for preprocedural cardiovascular examination: Secondary | ICD-10-CM | POA: Diagnosis not present

## 2014-09-10 DIAGNOSIS — I4891 Unspecified atrial fibrillation: Secondary | ICD-10-CM | POA: Diagnosis not present

## 2014-09-10 DIAGNOSIS — I071 Rheumatic tricuspid insufficiency: Secondary | ICD-10-CM | POA: Diagnosis not present

## 2014-09-10 DIAGNOSIS — I340 Nonrheumatic mitral (valve) insufficiency: Secondary | ICD-10-CM | POA: Diagnosis not present

## 2014-09-10 DIAGNOSIS — I480 Paroxysmal atrial fibrillation: Secondary | ICD-10-CM | POA: Diagnosis not present

## 2014-09-14 DIAGNOSIS — Z950 Presence of cardiac pacemaker: Secondary | ICD-10-CM | POA: Diagnosis not present

## 2014-09-14 DIAGNOSIS — I4891 Unspecified atrial fibrillation: Secondary | ICD-10-CM | POA: Diagnosis not present

## 2014-09-14 DIAGNOSIS — K219 Gastro-esophageal reflux disease without esophagitis: Secondary | ICD-10-CM | POA: Diagnosis not present

## 2014-09-14 DIAGNOSIS — E780 Pure hypercholesterolemia: Secondary | ICD-10-CM | POA: Diagnosis not present

## 2014-09-14 DIAGNOSIS — J439 Emphysema, unspecified: Secondary | ICD-10-CM | POA: Diagnosis not present

## 2014-09-14 DIAGNOSIS — Y999 Unspecified external cause status: Secondary | ICD-10-CM | POA: Diagnosis not present

## 2014-09-14 DIAGNOSIS — M4850XA Collapsed vertebra, not elsewhere classified, site unspecified, initial encounter for fracture: Secondary | ICD-10-CM | POA: Diagnosis not present

## 2014-09-14 DIAGNOSIS — S32000A Wedge compression fracture of unspecified lumbar vertebra, initial encounter for closed fracture: Secondary | ICD-10-CM | POA: Diagnosis not present

## 2014-09-14 DIAGNOSIS — J310 Chronic rhinitis: Secondary | ICD-10-CM | POA: Diagnosis not present

## 2014-09-14 DIAGNOSIS — M810 Age-related osteoporosis without current pathological fracture: Secondary | ICD-10-CM | POA: Diagnosis not present

## 2014-09-14 DIAGNOSIS — E785 Hyperlipidemia, unspecified: Secondary | ICD-10-CM | POA: Diagnosis not present

## 2014-09-14 DIAGNOSIS — M4856XA Collapsed vertebra, not elsewhere classified, lumbar region, initial encounter for fracture: Secondary | ICD-10-CM | POA: Diagnosis not present

## 2014-09-14 DIAGNOSIS — Z7982 Long term (current) use of aspirin: Secondary | ICD-10-CM | POA: Diagnosis not present

## 2014-09-14 DIAGNOSIS — I10 Essential (primary) hypertension: Secondary | ICD-10-CM | POA: Diagnosis not present

## 2014-09-21 DIAGNOSIS — R1033 Periumbilical pain: Secondary | ICD-10-CM | POA: Diagnosis not present

## 2014-09-21 DIAGNOSIS — K5730 Diverticulosis of large intestine without perforation or abscess without bleeding: Secondary | ICD-10-CM | POA: Diagnosis not present

## 2014-09-21 DIAGNOSIS — Z7982 Long term (current) use of aspirin: Secondary | ICD-10-CM | POA: Diagnosis not present

## 2014-09-21 DIAGNOSIS — R109 Unspecified abdominal pain: Secondary | ICD-10-CM | POA: Diagnosis not present

## 2014-09-21 DIAGNOSIS — K5900 Constipation, unspecified: Secondary | ICD-10-CM | POA: Diagnosis not present

## 2014-09-21 DIAGNOSIS — I10 Essential (primary) hypertension: Secondary | ICD-10-CM | POA: Diagnosis not present

## 2014-09-21 DIAGNOSIS — E780 Pure hypercholesterolemia: Secondary | ICD-10-CM | POA: Diagnosis not present

## 2014-09-22 DIAGNOSIS — R109 Unspecified abdominal pain: Secondary | ICD-10-CM | POA: Diagnosis not present

## 2014-09-22 DIAGNOSIS — Z8781 Personal history of (healed) traumatic fracture: Secondary | ICD-10-CM | POA: Diagnosis not present

## 2014-09-29 DIAGNOSIS — Z9889 Other specified postprocedural states: Secondary | ICD-10-CM | POA: Diagnosis not present

## 2014-10-13 DIAGNOSIS — R1013 Epigastric pain: Secondary | ICD-10-CM | POA: Diagnosis not present

## 2014-10-13 DIAGNOSIS — R1031 Right lower quadrant pain: Secondary | ICD-10-CM | POA: Diagnosis not present

## 2014-10-15 DIAGNOSIS — R1013 Epigastric pain: Secondary | ICD-10-CM | POA: Diagnosis not present

## 2014-10-15 DIAGNOSIS — K2950 Unspecified chronic gastritis without bleeding: Secondary | ICD-10-CM | POA: Diagnosis not present

## 2014-10-15 DIAGNOSIS — Z79899 Other long term (current) drug therapy: Secondary | ICD-10-CM | POA: Diagnosis not present

## 2014-10-15 DIAGNOSIS — R1031 Right lower quadrant pain: Secondary | ICD-10-CM | POA: Diagnosis not present

## 2014-10-15 DIAGNOSIS — K2900 Acute gastritis without bleeding: Secondary | ICD-10-CM | POA: Diagnosis not present

## 2014-10-15 DIAGNOSIS — Z950 Presence of cardiac pacemaker: Secondary | ICD-10-CM | POA: Diagnosis not present

## 2014-10-15 DIAGNOSIS — K219 Gastro-esophageal reflux disease without esophagitis: Secondary | ICD-10-CM | POA: Diagnosis not present

## 2014-10-15 HISTORY — PX: ESOPHAGOGASTRODUODENOSCOPY: SHX1529

## 2014-10-20 DIAGNOSIS — M810 Age-related osteoporosis without current pathological fracture: Secondary | ICD-10-CM | POA: Diagnosis not present

## 2014-10-20 DIAGNOSIS — Z9889 Other specified postprocedural states: Secondary | ICD-10-CM | POA: Diagnosis not present

## 2014-10-26 DIAGNOSIS — M810 Age-related osteoporosis without current pathological fracture: Secondary | ICD-10-CM | POA: Diagnosis not present

## 2014-11-03 DIAGNOSIS — E538 Deficiency of other specified B group vitamins: Secondary | ICD-10-CM | POA: Diagnosis not present

## 2014-11-03 DIAGNOSIS — E780 Pure hypercholesterolemia: Secondary | ICD-10-CM | POA: Diagnosis not present

## 2014-11-03 DIAGNOSIS — Z79899 Other long term (current) drug therapy: Secondary | ICD-10-CM | POA: Diagnosis not present

## 2014-11-03 DIAGNOSIS — M159 Polyosteoarthritis, unspecified: Secondary | ICD-10-CM | POA: Diagnosis not present

## 2014-11-03 DIAGNOSIS — R296 Repeated falls: Secondary | ICD-10-CM | POA: Diagnosis not present

## 2014-11-03 DIAGNOSIS — I4891 Unspecified atrial fibrillation: Secondary | ICD-10-CM | POA: Diagnosis not present

## 2014-11-03 DIAGNOSIS — E559 Vitamin D deficiency, unspecified: Secondary | ICD-10-CM | POA: Diagnosis not present

## 2014-11-03 DIAGNOSIS — K219 Gastro-esophageal reflux disease without esophagitis: Secondary | ICD-10-CM | POA: Diagnosis not present

## 2014-11-03 DIAGNOSIS — M810 Age-related osteoporosis without current pathological fracture: Secondary | ICD-10-CM | POA: Diagnosis not present

## 2014-11-18 DIAGNOSIS — M810 Age-related osteoporosis without current pathological fracture: Secondary | ICD-10-CM | POA: Diagnosis not present

## 2014-11-24 DIAGNOSIS — Z681 Body mass index (BMI) 19 or less, adult: Secondary | ICD-10-CM | POA: Diagnosis not present

## 2014-11-24 DIAGNOSIS — K5900 Constipation, unspecified: Secondary | ICD-10-CM | POA: Diagnosis not present

## 2014-11-24 DIAGNOSIS — R1013 Epigastric pain: Secondary | ICD-10-CM | POA: Diagnosis not present

## 2014-12-08 DIAGNOSIS — M6281 Muscle weakness (generalized): Secondary | ICD-10-CM | POA: Diagnosis not present

## 2014-12-08 DIAGNOSIS — S32010S Wedge compression fracture of first lumbar vertebra, sequela: Secondary | ICD-10-CM | POA: Diagnosis not present

## 2014-12-08 DIAGNOSIS — M545 Low back pain: Secondary | ICD-10-CM | POA: Diagnosis not present

## 2014-12-08 DIAGNOSIS — R293 Abnormal posture: Secondary | ICD-10-CM | POA: Diagnosis not present

## 2014-12-08 DIAGNOSIS — Z9889 Other specified postprocedural states: Secondary | ICD-10-CM | POA: Diagnosis not present

## 2014-12-08 DIAGNOSIS — R2689 Other abnormalities of gait and mobility: Secondary | ICD-10-CM | POA: Diagnosis not present

## 2014-12-15 DIAGNOSIS — S32010S Wedge compression fracture of first lumbar vertebra, sequela: Secondary | ICD-10-CM | POA: Diagnosis not present

## 2014-12-15 DIAGNOSIS — Z9889 Other specified postprocedural states: Secondary | ICD-10-CM | POA: Diagnosis not present

## 2014-12-15 DIAGNOSIS — R2689 Other abnormalities of gait and mobility: Secondary | ICD-10-CM | POA: Diagnosis not present

## 2014-12-15 DIAGNOSIS — R293 Abnormal posture: Secondary | ICD-10-CM | POA: Diagnosis not present

## 2014-12-15 DIAGNOSIS — M545 Low back pain: Secondary | ICD-10-CM | POA: Diagnosis not present

## 2014-12-15 DIAGNOSIS — M6281 Muscle weakness (generalized): Secondary | ICD-10-CM | POA: Diagnosis not present

## 2014-12-17 DIAGNOSIS — K2950 Unspecified chronic gastritis without bleeding: Secondary | ICD-10-CM | POA: Diagnosis not present

## 2014-12-17 DIAGNOSIS — Z681 Body mass index (BMI) 19 or less, adult: Secondary | ICD-10-CM | POA: Diagnosis not present

## 2014-12-18 DIAGNOSIS — R293 Abnormal posture: Secondary | ICD-10-CM | POA: Diagnosis not present

## 2014-12-18 DIAGNOSIS — M6281 Muscle weakness (generalized): Secondary | ICD-10-CM | POA: Diagnosis not present

## 2014-12-18 DIAGNOSIS — R2689 Other abnormalities of gait and mobility: Secondary | ICD-10-CM | POA: Diagnosis not present

## 2014-12-18 DIAGNOSIS — S32010S Wedge compression fracture of first lumbar vertebra, sequela: Secondary | ICD-10-CM | POA: Diagnosis not present

## 2014-12-18 DIAGNOSIS — M545 Low back pain: Secondary | ICD-10-CM | POA: Diagnosis not present

## 2014-12-18 DIAGNOSIS — Z9889 Other specified postprocedural states: Secondary | ICD-10-CM | POA: Diagnosis not present

## 2014-12-22 DIAGNOSIS — S32010S Wedge compression fracture of first lumbar vertebra, sequela: Secondary | ICD-10-CM | POA: Diagnosis not present

## 2014-12-22 DIAGNOSIS — R2689 Other abnormalities of gait and mobility: Secondary | ICD-10-CM | POA: Diagnosis not present

## 2014-12-22 DIAGNOSIS — Z9889 Other specified postprocedural states: Secondary | ICD-10-CM | POA: Diagnosis not present

## 2014-12-22 DIAGNOSIS — M6281 Muscle weakness (generalized): Secondary | ICD-10-CM | POA: Diagnosis not present

## 2014-12-22 DIAGNOSIS — M545 Low back pain: Secondary | ICD-10-CM | POA: Diagnosis not present

## 2014-12-22 DIAGNOSIS — R293 Abnormal posture: Secondary | ICD-10-CM | POA: Diagnosis not present

## 2014-12-24 DIAGNOSIS — R2689 Other abnormalities of gait and mobility: Secondary | ICD-10-CM | POA: Diagnosis not present

## 2014-12-24 DIAGNOSIS — M6281 Muscle weakness (generalized): Secondary | ICD-10-CM | POA: Diagnosis not present

## 2014-12-24 DIAGNOSIS — M545 Low back pain: Secondary | ICD-10-CM | POA: Diagnosis not present

## 2014-12-24 DIAGNOSIS — R293 Abnormal posture: Secondary | ICD-10-CM | POA: Diagnosis not present

## 2014-12-24 DIAGNOSIS — S32010S Wedge compression fracture of first lumbar vertebra, sequela: Secondary | ICD-10-CM | POA: Diagnosis not present

## 2014-12-24 DIAGNOSIS — Z9889 Other specified postprocedural states: Secondary | ICD-10-CM | POA: Diagnosis not present

## 2014-12-30 DIAGNOSIS — M6281 Muscle weakness (generalized): Secondary | ICD-10-CM | POA: Diagnosis not present

## 2014-12-30 DIAGNOSIS — S32010S Wedge compression fracture of first lumbar vertebra, sequela: Secondary | ICD-10-CM | POA: Diagnosis not present

## 2014-12-30 DIAGNOSIS — R2689 Other abnormalities of gait and mobility: Secondary | ICD-10-CM | POA: Diagnosis not present

## 2014-12-30 DIAGNOSIS — R293 Abnormal posture: Secondary | ICD-10-CM | POA: Diagnosis not present

## 2014-12-30 DIAGNOSIS — M545 Low back pain: Secondary | ICD-10-CM | POA: Diagnosis not present

## 2014-12-30 DIAGNOSIS — Z9889 Other specified postprocedural states: Secondary | ICD-10-CM | POA: Diagnosis not present

## 2015-01-01 DIAGNOSIS — R293 Abnormal posture: Secondary | ICD-10-CM | POA: Diagnosis not present

## 2015-01-01 DIAGNOSIS — Z9889 Other specified postprocedural states: Secondary | ICD-10-CM | POA: Diagnosis not present

## 2015-01-01 DIAGNOSIS — M545 Low back pain: Secondary | ICD-10-CM | POA: Diagnosis not present

## 2015-01-01 DIAGNOSIS — M6281 Muscle weakness (generalized): Secondary | ICD-10-CM | POA: Diagnosis not present

## 2015-01-01 DIAGNOSIS — S32010S Wedge compression fracture of first lumbar vertebra, sequela: Secondary | ICD-10-CM | POA: Diagnosis not present

## 2015-01-01 DIAGNOSIS — R2689 Other abnormalities of gait and mobility: Secondary | ICD-10-CM | POA: Diagnosis not present

## 2015-01-06 DIAGNOSIS — R2689 Other abnormalities of gait and mobility: Secondary | ICD-10-CM | POA: Diagnosis not present

## 2015-01-06 DIAGNOSIS — S32010S Wedge compression fracture of first lumbar vertebra, sequela: Secondary | ICD-10-CM | POA: Diagnosis not present

## 2015-01-06 DIAGNOSIS — M6281 Muscle weakness (generalized): Secondary | ICD-10-CM | POA: Diagnosis not present

## 2015-01-06 DIAGNOSIS — M545 Low back pain: Secondary | ICD-10-CM | POA: Diagnosis not present

## 2015-01-06 DIAGNOSIS — R293 Abnormal posture: Secondary | ICD-10-CM | POA: Diagnosis not present

## 2015-01-06 DIAGNOSIS — Z9889 Other specified postprocedural states: Secondary | ICD-10-CM | POA: Diagnosis not present

## 2015-01-08 DIAGNOSIS — Z9889 Other specified postprocedural states: Secondary | ICD-10-CM | POA: Diagnosis not present

## 2015-01-08 DIAGNOSIS — M6281 Muscle weakness (generalized): Secondary | ICD-10-CM | POA: Diagnosis not present

## 2015-01-08 DIAGNOSIS — S32010S Wedge compression fracture of first lumbar vertebra, sequela: Secondary | ICD-10-CM | POA: Diagnosis not present

## 2015-01-08 DIAGNOSIS — R2689 Other abnormalities of gait and mobility: Secondary | ICD-10-CM | POA: Diagnosis not present

## 2015-01-08 DIAGNOSIS — R293 Abnormal posture: Secondary | ICD-10-CM | POA: Diagnosis not present

## 2015-01-08 DIAGNOSIS — M545 Low back pain: Secondary | ICD-10-CM | POA: Diagnosis not present

## 2015-01-13 DIAGNOSIS — M545 Low back pain: Secondary | ICD-10-CM | POA: Diagnosis not present

## 2015-01-13 DIAGNOSIS — M6281 Muscle weakness (generalized): Secondary | ICD-10-CM | POA: Diagnosis not present

## 2015-01-13 DIAGNOSIS — Z9889 Other specified postprocedural states: Secondary | ICD-10-CM | POA: Diagnosis not present

## 2015-01-13 DIAGNOSIS — S32010S Wedge compression fracture of first lumbar vertebra, sequela: Secondary | ICD-10-CM | POA: Diagnosis not present

## 2015-01-13 DIAGNOSIS — R2689 Other abnormalities of gait and mobility: Secondary | ICD-10-CM | POA: Diagnosis not present

## 2015-01-13 DIAGNOSIS — R293 Abnormal posture: Secondary | ICD-10-CM | POA: Diagnosis not present

## 2015-01-15 DIAGNOSIS — S32010S Wedge compression fracture of first lumbar vertebra, sequela: Secondary | ICD-10-CM | POA: Diagnosis not present

## 2015-01-15 DIAGNOSIS — R2689 Other abnormalities of gait and mobility: Secondary | ICD-10-CM | POA: Diagnosis not present

## 2015-01-15 DIAGNOSIS — Z9889 Other specified postprocedural states: Secondary | ICD-10-CM | POA: Diagnosis not present

## 2015-01-15 DIAGNOSIS — M6281 Muscle weakness (generalized): Secondary | ICD-10-CM | POA: Diagnosis not present

## 2015-01-15 DIAGNOSIS — R293 Abnormal posture: Secondary | ICD-10-CM | POA: Diagnosis not present

## 2015-01-15 DIAGNOSIS — M545 Low back pain: Secondary | ICD-10-CM | POA: Diagnosis not present

## 2015-01-20 DIAGNOSIS — M6281 Muscle weakness (generalized): Secondary | ICD-10-CM | POA: Diagnosis not present

## 2015-01-20 DIAGNOSIS — R2689 Other abnormalities of gait and mobility: Secondary | ICD-10-CM | POA: Diagnosis not present

## 2015-01-20 DIAGNOSIS — R293 Abnormal posture: Secondary | ICD-10-CM | POA: Diagnosis not present

## 2015-01-20 DIAGNOSIS — S32010S Wedge compression fracture of first lumbar vertebra, sequela: Secondary | ICD-10-CM | POA: Diagnosis not present

## 2015-01-20 DIAGNOSIS — M545 Low back pain: Secondary | ICD-10-CM | POA: Diagnosis not present

## 2015-01-20 DIAGNOSIS — Z9889 Other specified postprocedural states: Secondary | ICD-10-CM | POA: Diagnosis not present

## 2015-01-21 DIAGNOSIS — R0602 Shortness of breath: Secondary | ICD-10-CM | POA: Diagnosis not present

## 2015-01-21 DIAGNOSIS — I480 Paroxysmal atrial fibrillation: Secondary | ICD-10-CM | POA: Diagnosis not present

## 2015-01-22 DIAGNOSIS — M545 Low back pain: Secondary | ICD-10-CM | POA: Diagnosis not present

## 2015-01-22 DIAGNOSIS — S32010S Wedge compression fracture of first lumbar vertebra, sequela: Secondary | ICD-10-CM | POA: Diagnosis not present

## 2015-01-22 DIAGNOSIS — R293 Abnormal posture: Secondary | ICD-10-CM | POA: Diagnosis not present

## 2015-01-22 DIAGNOSIS — M6281 Muscle weakness (generalized): Secondary | ICD-10-CM | POA: Diagnosis not present

## 2015-01-22 DIAGNOSIS — Z9889 Other specified postprocedural states: Secondary | ICD-10-CM | POA: Diagnosis not present

## 2015-01-22 DIAGNOSIS — R2689 Other abnormalities of gait and mobility: Secondary | ICD-10-CM | POA: Diagnosis not present

## 2015-01-25 DIAGNOSIS — R293 Abnormal posture: Secondary | ICD-10-CM | POA: Diagnosis not present

## 2015-01-25 DIAGNOSIS — R2689 Other abnormalities of gait and mobility: Secondary | ICD-10-CM | POA: Diagnosis not present

## 2015-01-25 DIAGNOSIS — Z9889 Other specified postprocedural states: Secondary | ICD-10-CM | POA: Diagnosis not present

## 2015-01-25 DIAGNOSIS — M545 Low back pain: Secondary | ICD-10-CM | POA: Diagnosis not present

## 2015-01-25 DIAGNOSIS — M6281 Muscle weakness (generalized): Secondary | ICD-10-CM | POA: Diagnosis not present

## 2015-01-25 DIAGNOSIS — S32010S Wedge compression fracture of first lumbar vertebra, sequela: Secondary | ICD-10-CM | POA: Diagnosis not present

## 2015-01-26 DIAGNOSIS — F329 Major depressive disorder, single episode, unspecified: Secondary | ICD-10-CM | POA: Diagnosis not present

## 2015-01-26 DIAGNOSIS — R5383 Other fatigue: Secondary | ICD-10-CM | POA: Diagnosis not present

## 2015-01-26 DIAGNOSIS — G4700 Insomnia, unspecified: Secondary | ICD-10-CM | POA: Diagnosis not present

## 2015-01-28 DIAGNOSIS — I498 Other specified cardiac arrhythmias: Secondary | ICD-10-CM | POA: Diagnosis not present

## 2015-01-28 DIAGNOSIS — Z45010 Encounter for checking and testing of cardiac pacemaker pulse generator [battery]: Secondary | ICD-10-CM | POA: Diagnosis not present

## 2015-02-03 DIAGNOSIS — S32010S Wedge compression fracture of first lumbar vertebra, sequela: Secondary | ICD-10-CM | POA: Diagnosis not present

## 2015-02-03 DIAGNOSIS — Z9889 Other specified postprocedural states: Secondary | ICD-10-CM | POA: Diagnosis not present

## 2015-02-03 DIAGNOSIS — M545 Low back pain: Secondary | ICD-10-CM | POA: Diagnosis not present

## 2015-02-03 DIAGNOSIS — R2689 Other abnormalities of gait and mobility: Secondary | ICD-10-CM | POA: Diagnosis not present

## 2015-02-03 DIAGNOSIS — M6281 Muscle weakness (generalized): Secondary | ICD-10-CM | POA: Diagnosis not present

## 2015-02-03 DIAGNOSIS — R293 Abnormal posture: Secondary | ICD-10-CM | POA: Diagnosis not present

## 2015-02-05 DIAGNOSIS — M545 Low back pain: Secondary | ICD-10-CM | POA: Diagnosis not present

## 2015-02-05 DIAGNOSIS — R2689 Other abnormalities of gait and mobility: Secondary | ICD-10-CM | POA: Diagnosis not present

## 2015-02-05 DIAGNOSIS — R293 Abnormal posture: Secondary | ICD-10-CM | POA: Diagnosis not present

## 2015-02-05 DIAGNOSIS — M6281 Muscle weakness (generalized): Secondary | ICD-10-CM | POA: Diagnosis not present

## 2015-02-05 DIAGNOSIS — S32010S Wedge compression fracture of first lumbar vertebra, sequela: Secondary | ICD-10-CM | POA: Diagnosis not present

## 2015-02-05 DIAGNOSIS — Z9889 Other specified postprocedural states: Secondary | ICD-10-CM | POA: Diagnosis not present

## 2015-02-08 DIAGNOSIS — M6281 Muscle weakness (generalized): Secondary | ICD-10-CM | POA: Diagnosis not present

## 2015-02-08 DIAGNOSIS — Z9889 Other specified postprocedural states: Secondary | ICD-10-CM | POA: Diagnosis not present

## 2015-02-08 DIAGNOSIS — R293 Abnormal posture: Secondary | ICD-10-CM | POA: Diagnosis not present

## 2015-02-08 DIAGNOSIS — S32010S Wedge compression fracture of first lumbar vertebra, sequela: Secondary | ICD-10-CM | POA: Diagnosis not present

## 2015-02-08 DIAGNOSIS — M545 Low back pain: Secondary | ICD-10-CM | POA: Diagnosis not present

## 2015-02-08 DIAGNOSIS — R2689 Other abnormalities of gait and mobility: Secondary | ICD-10-CM | POA: Diagnosis not present

## 2015-02-11 DIAGNOSIS — S32010S Wedge compression fracture of first lumbar vertebra, sequela: Secondary | ICD-10-CM | POA: Diagnosis not present

## 2015-02-11 DIAGNOSIS — M6281 Muscle weakness (generalized): Secondary | ICD-10-CM | POA: Diagnosis not present

## 2015-02-11 DIAGNOSIS — R293 Abnormal posture: Secondary | ICD-10-CM | POA: Diagnosis not present

## 2015-02-11 DIAGNOSIS — R2689 Other abnormalities of gait and mobility: Secondary | ICD-10-CM | POA: Diagnosis not present

## 2015-02-11 DIAGNOSIS — Z9889 Other specified postprocedural states: Secondary | ICD-10-CM | POA: Diagnosis not present

## 2015-02-11 DIAGNOSIS — M545 Low back pain: Secondary | ICD-10-CM | POA: Diagnosis not present

## 2015-02-17 DIAGNOSIS — Z9889 Other specified postprocedural states: Secondary | ICD-10-CM | POA: Diagnosis not present

## 2015-02-17 DIAGNOSIS — R293 Abnormal posture: Secondary | ICD-10-CM | POA: Diagnosis not present

## 2015-02-17 DIAGNOSIS — R2689 Other abnormalities of gait and mobility: Secondary | ICD-10-CM | POA: Diagnosis not present

## 2015-02-17 DIAGNOSIS — M6281 Muscle weakness (generalized): Secondary | ICD-10-CM | POA: Diagnosis not present

## 2015-02-17 DIAGNOSIS — S32010S Wedge compression fracture of first lumbar vertebra, sequela: Secondary | ICD-10-CM | POA: Diagnosis not present

## 2015-02-17 DIAGNOSIS — M545 Low back pain: Secondary | ICD-10-CM | POA: Diagnosis not present

## 2015-02-19 DIAGNOSIS — R2689 Other abnormalities of gait and mobility: Secondary | ICD-10-CM | POA: Diagnosis not present

## 2015-02-19 DIAGNOSIS — S32010S Wedge compression fracture of first lumbar vertebra, sequela: Secondary | ICD-10-CM | POA: Diagnosis not present

## 2015-02-19 DIAGNOSIS — M545 Low back pain: Secondary | ICD-10-CM | POA: Diagnosis not present

## 2015-02-19 DIAGNOSIS — R293 Abnormal posture: Secondary | ICD-10-CM | POA: Diagnosis not present

## 2015-02-19 DIAGNOSIS — M6281 Muscle weakness (generalized): Secondary | ICD-10-CM | POA: Diagnosis not present

## 2015-02-19 DIAGNOSIS — Z9889 Other specified postprocedural states: Secondary | ICD-10-CM | POA: Diagnosis not present

## 2015-02-24 DIAGNOSIS — R2689 Other abnormalities of gait and mobility: Secondary | ICD-10-CM | POA: Diagnosis not present

## 2015-02-24 DIAGNOSIS — S32010S Wedge compression fracture of first lumbar vertebra, sequela: Secondary | ICD-10-CM | POA: Diagnosis not present

## 2015-02-24 DIAGNOSIS — M545 Low back pain: Secondary | ICD-10-CM | POA: Diagnosis not present

## 2015-02-24 DIAGNOSIS — Z9889 Other specified postprocedural states: Secondary | ICD-10-CM | POA: Diagnosis not present

## 2015-02-24 DIAGNOSIS — R293 Abnormal posture: Secondary | ICD-10-CM | POA: Diagnosis not present

## 2015-02-24 DIAGNOSIS — M6281 Muscle weakness (generalized): Secondary | ICD-10-CM | POA: Diagnosis not present

## 2015-02-26 DIAGNOSIS — M545 Low back pain: Secondary | ICD-10-CM | POA: Diagnosis not present

## 2015-02-26 DIAGNOSIS — R293 Abnormal posture: Secondary | ICD-10-CM | POA: Diagnosis not present

## 2015-02-26 DIAGNOSIS — R2689 Other abnormalities of gait and mobility: Secondary | ICD-10-CM | POA: Diagnosis not present

## 2015-02-26 DIAGNOSIS — S32010S Wedge compression fracture of first lumbar vertebra, sequela: Secondary | ICD-10-CM | POA: Diagnosis not present

## 2015-02-26 DIAGNOSIS — M6281 Muscle weakness (generalized): Secondary | ICD-10-CM | POA: Diagnosis not present

## 2015-02-26 DIAGNOSIS — Z9889 Other specified postprocedural states: Secondary | ICD-10-CM | POA: Diagnosis not present

## 2015-03-01 DIAGNOSIS — R293 Abnormal posture: Secondary | ICD-10-CM | POA: Diagnosis not present

## 2015-03-01 DIAGNOSIS — R2689 Other abnormalities of gait and mobility: Secondary | ICD-10-CM | POA: Diagnosis not present

## 2015-03-01 DIAGNOSIS — M6281 Muscle weakness (generalized): Secondary | ICD-10-CM | POA: Diagnosis not present

## 2015-03-01 DIAGNOSIS — S32010S Wedge compression fracture of first lumbar vertebra, sequela: Secondary | ICD-10-CM | POA: Diagnosis not present

## 2015-03-01 DIAGNOSIS — Z9889 Other specified postprocedural states: Secondary | ICD-10-CM | POA: Diagnosis not present

## 2015-03-01 DIAGNOSIS — M545 Low back pain: Secondary | ICD-10-CM | POA: Diagnosis not present

## 2015-03-02 DIAGNOSIS — Z681 Body mass index (BMI) 19 or less, adult: Secondary | ICD-10-CM | POA: Diagnosis not present

## 2015-03-02 DIAGNOSIS — R5383 Other fatigue: Secondary | ICD-10-CM | POA: Diagnosis not present

## 2015-03-02 DIAGNOSIS — F329 Major depressive disorder, single episode, unspecified: Secondary | ICD-10-CM | POA: Diagnosis not present

## 2015-03-02 DIAGNOSIS — G4700 Insomnia, unspecified: Secondary | ICD-10-CM | POA: Diagnosis not present

## 2015-03-02 DIAGNOSIS — Z79899 Other long term (current) drug therapy: Secondary | ICD-10-CM | POA: Diagnosis not present

## 2015-03-03 DIAGNOSIS — S32010S Wedge compression fracture of first lumbar vertebra, sequela: Secondary | ICD-10-CM | POA: Diagnosis not present

## 2015-03-03 DIAGNOSIS — M542 Cervicalgia: Secondary | ICD-10-CM | POA: Diagnosis not present

## 2015-03-03 DIAGNOSIS — R293 Abnormal posture: Secondary | ICD-10-CM | POA: Diagnosis not present

## 2015-03-03 DIAGNOSIS — R2689 Other abnormalities of gait and mobility: Secondary | ICD-10-CM | POA: Diagnosis not present

## 2015-03-03 DIAGNOSIS — Z9889 Other specified postprocedural states: Secondary | ICD-10-CM | POA: Diagnosis not present

## 2015-03-03 DIAGNOSIS — M6281 Muscle weakness (generalized): Secondary | ICD-10-CM | POA: Diagnosis not present

## 2015-03-09 DIAGNOSIS — E860 Dehydration: Secondary | ICD-10-CM | POA: Diagnosis not present

## 2015-03-22 DIAGNOSIS — M1712 Unilateral primary osteoarthritis, left knee: Secondary | ICD-10-CM | POA: Diagnosis not present

## 2015-03-22 DIAGNOSIS — M25562 Pain in left knee: Secondary | ICD-10-CM | POA: Diagnosis not present

## 2015-04-09 DIAGNOSIS — Z681 Body mass index (BMI) 19 or less, adult: Secondary | ICD-10-CM | POA: Diagnosis not present

## 2015-04-09 DIAGNOSIS — R05 Cough: Secondary | ICD-10-CM | POA: Diagnosis not present

## 2015-04-19 DIAGNOSIS — M1712 Unilateral primary osteoarthritis, left knee: Secondary | ICD-10-CM | POA: Diagnosis not present

## 2015-04-26 DIAGNOSIS — Z79899 Other long term (current) drug therapy: Secondary | ICD-10-CM | POA: Diagnosis not present

## 2015-04-26 DIAGNOSIS — K2950 Unspecified chronic gastritis without bleeding: Secondary | ICD-10-CM | POA: Diagnosis not present

## 2015-04-26 DIAGNOSIS — R05 Cough: Secondary | ICD-10-CM | POA: Diagnosis not present

## 2015-04-26 DIAGNOSIS — J309 Allergic rhinitis, unspecified: Secondary | ICD-10-CM | POA: Diagnosis not present

## 2015-05-25 DIAGNOSIS — Z681 Body mass index (BMI) 19 or less, adult: Secondary | ICD-10-CM | POA: Diagnosis not present

## 2015-05-25 DIAGNOSIS — M810 Age-related osteoporosis without current pathological fracture: Secondary | ICD-10-CM | POA: Diagnosis not present

## 2015-05-25 DIAGNOSIS — Z79899 Other long term (current) drug therapy: Secondary | ICD-10-CM | POA: Diagnosis not present

## 2015-05-26 DIAGNOSIS — Z79899 Other long term (current) drug therapy: Secondary | ICD-10-CM | POA: Diagnosis not present

## 2015-05-26 DIAGNOSIS — E559 Vitamin D deficiency, unspecified: Secondary | ICD-10-CM | POA: Diagnosis not present

## 2015-06-01 DIAGNOSIS — H3531 Nonexudative age-related macular degeneration: Secondary | ICD-10-CM | POA: Diagnosis not present

## 2015-06-01 DIAGNOSIS — H26493 Other secondary cataract, bilateral: Secondary | ICD-10-CM | POA: Diagnosis not present

## 2015-06-03 DIAGNOSIS — M810 Age-related osteoporosis without current pathological fracture: Secondary | ICD-10-CM | POA: Diagnosis not present

## 2015-07-08 DIAGNOSIS — Z23 Encounter for immunization: Secondary | ICD-10-CM | POA: Diagnosis not present

## 2015-07-30 DIAGNOSIS — M1712 Unilateral primary osteoarthritis, left knee: Secondary | ICD-10-CM | POA: Diagnosis not present

## 2015-08-12 DIAGNOSIS — R001 Bradycardia, unspecified: Secondary | ICD-10-CM | POA: Insufficient documentation

## 2015-08-12 DIAGNOSIS — Z950 Presence of cardiac pacemaker: Secondary | ICD-10-CM

## 2015-08-12 HISTORY — DX: Presence of cardiac pacemaker: Z95.0

## 2015-08-12 HISTORY — DX: Bradycardia, unspecified: R00.1

## 2015-09-09 DIAGNOSIS — K591 Functional diarrhea: Secondary | ICD-10-CM | POA: Diagnosis not present

## 2015-10-25 DIAGNOSIS — Z681 Body mass index (BMI) 19 or less, adult: Secondary | ICD-10-CM | POA: Diagnosis not present

## 2015-10-25 DIAGNOSIS — J0100 Acute maxillary sinusitis, unspecified: Secondary | ICD-10-CM | POA: Diagnosis not present

## 2015-11-25 DIAGNOSIS — R001 Bradycardia, unspecified: Secondary | ICD-10-CM | POA: Diagnosis not present

## 2015-11-26 DIAGNOSIS — M4854XA Collapsed vertebra, not elsewhere classified, thoracic region, initial encounter for fracture: Secondary | ICD-10-CM | POA: Diagnosis not present

## 2015-11-26 DIAGNOSIS — J948 Other specified pleural conditions: Secondary | ICD-10-CM | POA: Diagnosis not present

## 2015-11-26 DIAGNOSIS — M8588 Other specified disorders of bone density and structure, other site: Secondary | ICD-10-CM | POA: Diagnosis not present

## 2015-11-26 DIAGNOSIS — R05 Cough: Secondary | ICD-10-CM | POA: Diagnosis not present

## 2015-11-26 DIAGNOSIS — R0600 Dyspnea, unspecified: Secondary | ICD-10-CM | POA: Diagnosis not present

## 2015-11-29 DIAGNOSIS — K2950 Unspecified chronic gastritis without bleeding: Secondary | ICD-10-CM | POA: Diagnosis not present

## 2015-11-29 DIAGNOSIS — Z79899 Other long term (current) drug therapy: Secondary | ICD-10-CM | POA: Diagnosis not present

## 2015-11-29 DIAGNOSIS — M159 Polyosteoarthritis, unspecified: Secondary | ICD-10-CM | POA: Diagnosis not present

## 2015-11-29 DIAGNOSIS — I4891 Unspecified atrial fibrillation: Secondary | ICD-10-CM | POA: Diagnosis not present

## 2015-11-29 DIAGNOSIS — Z8781 Personal history of (healed) traumatic fracture: Secondary | ICD-10-CM | POA: Diagnosis not present

## 2015-11-29 DIAGNOSIS — E7800 Pure hypercholesterolemia, unspecified: Secondary | ICD-10-CM | POA: Diagnosis not present

## 2015-11-29 DIAGNOSIS — E559 Vitamin D deficiency, unspecified: Secondary | ICD-10-CM | POA: Diagnosis not present

## 2015-11-29 DIAGNOSIS — M810 Age-related osteoporosis without current pathological fracture: Secondary | ICD-10-CM | POA: Diagnosis not present

## 2015-12-02 DIAGNOSIS — J069 Acute upper respiratory infection, unspecified: Secondary | ICD-10-CM | POA: Diagnosis not present

## 2015-12-02 DIAGNOSIS — H6122 Impacted cerumen, left ear: Secondary | ICD-10-CM | POA: Diagnosis not present

## 2015-12-02 DIAGNOSIS — Z681 Body mass index (BMI) 19 or less, adult: Secondary | ICD-10-CM | POA: Diagnosis not present

## 2015-12-02 DIAGNOSIS — Z1389 Encounter for screening for other disorder: Secondary | ICD-10-CM | POA: Diagnosis not present

## 2015-12-07 DIAGNOSIS — M810 Age-related osteoporosis without current pathological fracture: Secondary | ICD-10-CM | POA: Diagnosis not present

## 2016-01-21 DIAGNOSIS — M546 Pain in thoracic spine: Secondary | ICD-10-CM | POA: Diagnosis not present

## 2016-01-21 DIAGNOSIS — M545 Low back pain: Secondary | ICD-10-CM | POA: Diagnosis not present

## 2016-02-01 DIAGNOSIS — R636 Underweight: Secondary | ICD-10-CM | POA: Diagnosis not present

## 2016-02-01 DIAGNOSIS — F329 Major depressive disorder, single episode, unspecified: Secondary | ICD-10-CM | POA: Diagnosis not present

## 2016-02-01 DIAGNOSIS — Z681 Body mass index (BMI) 19 or less, adult: Secondary | ICD-10-CM | POA: Diagnosis not present

## 2016-02-01 DIAGNOSIS — F411 Generalized anxiety disorder: Secondary | ICD-10-CM | POA: Diagnosis not present

## 2016-02-16 DIAGNOSIS — Z681 Body mass index (BMI) 19 or less, adult: Secondary | ICD-10-CM | POA: Diagnosis not present

## 2016-02-16 DIAGNOSIS — F329 Major depressive disorder, single episode, unspecified: Secondary | ICD-10-CM | POA: Diagnosis not present

## 2016-02-16 DIAGNOSIS — Z79899 Other long term (current) drug therapy: Secondary | ICD-10-CM | POA: Diagnosis not present

## 2016-02-16 DIAGNOSIS — R636 Underweight: Secondary | ICD-10-CM | POA: Diagnosis not present

## 2016-02-16 DIAGNOSIS — F411 Generalized anxiety disorder: Secondary | ICD-10-CM | POA: Diagnosis not present

## 2016-03-06 DIAGNOSIS — Z681 Body mass index (BMI) 19 or less, adult: Secondary | ICD-10-CM | POA: Diagnosis not present

## 2016-03-06 DIAGNOSIS — Z9181 History of falling: Secondary | ICD-10-CM | POA: Diagnosis not present

## 2016-03-06 DIAGNOSIS — M778 Other enthesopathies, not elsewhere classified: Secondary | ICD-10-CM | POA: Diagnosis not present

## 2016-03-29 DIAGNOSIS — R0789 Other chest pain: Secondary | ICD-10-CM | POA: Diagnosis not present

## 2016-03-29 DIAGNOSIS — R001 Bradycardia, unspecified: Secondary | ICD-10-CM | POA: Diagnosis not present

## 2016-03-29 DIAGNOSIS — R079 Chest pain, unspecified: Secondary | ICD-10-CM | POA: Diagnosis not present

## 2016-03-29 DIAGNOSIS — Z950 Presence of cardiac pacemaker: Secondary | ICD-10-CM | POA: Diagnosis not present

## 2016-03-30 DIAGNOSIS — R0789 Other chest pain: Secondary | ICD-10-CM

## 2016-03-30 HISTORY — DX: Other chest pain: R07.89

## 2016-04-07 DIAGNOSIS — Z950 Presence of cardiac pacemaker: Secondary | ICD-10-CM | POA: Diagnosis not present

## 2016-04-07 DIAGNOSIS — I700 Atherosclerosis of aorta: Secondary | ICD-10-CM | POA: Diagnosis not present

## 2016-04-07 DIAGNOSIS — R0789 Other chest pain: Secondary | ICD-10-CM | POA: Diagnosis not present

## 2016-04-07 DIAGNOSIS — R001 Bradycardia, unspecified: Secondary | ICD-10-CM | POA: Diagnosis not present

## 2016-04-12 DIAGNOSIS — Z681 Body mass index (BMI) 19 or less, adult: Secondary | ICD-10-CM | POA: Diagnosis not present

## 2016-04-12 DIAGNOSIS — K219 Gastro-esophageal reflux disease without esophagitis: Secondary | ICD-10-CM | POA: Diagnosis not present

## 2016-04-20 DIAGNOSIS — R12 Heartburn: Secondary | ICD-10-CM | POA: Diagnosis not present

## 2016-04-25 DIAGNOSIS — Z950 Presence of cardiac pacemaker: Secondary | ICD-10-CM | POA: Diagnosis not present

## 2016-04-25 DIAGNOSIS — Z681 Body mass index (BMI) 19 or less, adult: Secondary | ICD-10-CM | POA: Diagnosis not present

## 2016-04-25 DIAGNOSIS — R0789 Other chest pain: Secondary | ICD-10-CM | POA: Diagnosis not present

## 2016-04-25 DIAGNOSIS — R001 Bradycardia, unspecified: Secondary | ICD-10-CM | POA: Diagnosis not present

## 2016-05-09 DIAGNOSIS — H26493 Other secondary cataract, bilateral: Secondary | ICD-10-CM | POA: Diagnosis not present

## 2016-05-09 DIAGNOSIS — H524 Presbyopia: Secondary | ICD-10-CM | POA: Diagnosis not present

## 2016-05-09 DIAGNOSIS — H353131 Nonexudative age-related macular degeneration, bilateral, early dry stage: Secondary | ICD-10-CM | POA: Diagnosis not present

## 2016-05-29 DIAGNOSIS — Z79899 Other long term (current) drug therapy: Secondary | ICD-10-CM | POA: Diagnosis not present

## 2016-05-29 DIAGNOSIS — Z950 Presence of cardiac pacemaker: Secondary | ICD-10-CM | POA: Diagnosis not present

## 2016-05-29 DIAGNOSIS — R0789 Other chest pain: Secondary | ICD-10-CM | POA: Diagnosis not present

## 2016-05-29 DIAGNOSIS — Z681 Body mass index (BMI) 19 or less, adult: Secondary | ICD-10-CM | POA: Diagnosis not present

## 2016-05-29 DIAGNOSIS — E7800 Pure hypercholesterolemia, unspecified: Secondary | ICD-10-CM | POA: Diagnosis not present

## 2016-05-29 DIAGNOSIS — E559 Vitamin D deficiency, unspecified: Secondary | ICD-10-CM | POA: Diagnosis not present

## 2016-05-29 DIAGNOSIS — M810 Age-related osteoporosis without current pathological fracture: Secondary | ICD-10-CM | POA: Diagnosis not present

## 2016-05-29 DIAGNOSIS — K219 Gastro-esophageal reflux disease without esophagitis: Secondary | ICD-10-CM | POA: Diagnosis not present

## 2016-05-29 DIAGNOSIS — I4891 Unspecified atrial fibrillation: Secondary | ICD-10-CM | POA: Diagnosis not present

## 2016-05-29 DIAGNOSIS — R001 Bradycardia, unspecified: Secondary | ICD-10-CM | POA: Diagnosis not present

## 2016-06-15 DIAGNOSIS — M810 Age-related osteoporosis without current pathological fracture: Secondary | ICD-10-CM | POA: Diagnosis not present

## 2016-06-26 DIAGNOSIS — R0781 Pleurodynia: Secondary | ICD-10-CM | POA: Diagnosis not present

## 2016-06-26 DIAGNOSIS — Z681 Body mass index (BMI) 19 or less, adult: Secondary | ICD-10-CM | POA: Diagnosis not present

## 2016-06-26 DIAGNOSIS — K219 Gastro-esophageal reflux disease without esophagitis: Secondary | ICD-10-CM | POA: Diagnosis not present

## 2016-07-04 DIAGNOSIS — R0789 Other chest pain: Secondary | ICD-10-CM | POA: Diagnosis not present

## 2016-07-04 DIAGNOSIS — R001 Bradycardia, unspecified: Secondary | ICD-10-CM | POA: Diagnosis not present

## 2016-07-04 DIAGNOSIS — Z681 Body mass index (BMI) 19 or less, adult: Secondary | ICD-10-CM | POA: Diagnosis not present

## 2016-07-04 DIAGNOSIS — Z950 Presence of cardiac pacemaker: Secondary | ICD-10-CM | POA: Diagnosis not present

## 2016-07-07 DIAGNOSIS — Z23 Encounter for immunization: Secondary | ICD-10-CM | POA: Diagnosis not present

## 2016-08-23 DIAGNOSIS — R001 Bradycardia, unspecified: Secondary | ICD-10-CM | POA: Diagnosis not present

## 2016-08-23 DIAGNOSIS — R0789 Other chest pain: Secondary | ICD-10-CM | POA: Diagnosis not present

## 2016-08-23 DIAGNOSIS — Z681 Body mass index (BMI) 19 or less, adult: Secondary | ICD-10-CM | POA: Diagnosis not present

## 2016-08-23 DIAGNOSIS — Z950 Presence of cardiac pacemaker: Secondary | ICD-10-CM | POA: Diagnosis not present

## 2016-09-09 DIAGNOSIS — S0990XA Unspecified injury of head, initial encounter: Secondary | ICD-10-CM | POA: Diagnosis not present

## 2016-09-09 DIAGNOSIS — S022XXA Fracture of nasal bones, initial encounter for closed fracture: Secondary | ICD-10-CM | POA: Diagnosis not present

## 2016-09-09 DIAGNOSIS — S329XXA Fracture of unspecified parts of lumbosacral spine and pelvis, initial encounter for closed fracture: Secondary | ICD-10-CM | POA: Diagnosis not present

## 2016-09-09 DIAGNOSIS — R259 Unspecified abnormal involuntary movements: Secondary | ICD-10-CM | POA: Diagnosis not present

## 2016-09-09 DIAGNOSIS — S12090A Other displaced fracture of first cervical vertebra, initial encounter for closed fracture: Secondary | ICD-10-CM | POA: Diagnosis not present

## 2016-09-09 DIAGNOSIS — R42 Dizziness and giddiness: Secondary | ICD-10-CM | POA: Diagnosis not present

## 2016-09-09 DIAGNOSIS — S12190A Other displaced fracture of second cervical vertebra, initial encounter for closed fracture: Secondary | ICD-10-CM | POA: Diagnosis not present

## 2016-09-10 DIAGNOSIS — N189 Chronic kidney disease, unspecified: Secondary | ICD-10-CM | POA: Diagnosis not present

## 2016-09-10 DIAGNOSIS — Z66 Do not resuscitate: Secondary | ICD-10-CM | POA: Diagnosis not present

## 2016-09-10 DIAGNOSIS — M542 Cervicalgia: Secondary | ICD-10-CM | POA: Diagnosis not present

## 2016-09-10 DIAGNOSIS — R269 Unspecified abnormalities of gait and mobility: Secondary | ICD-10-CM | POA: Diagnosis not present

## 2016-09-10 DIAGNOSIS — Z7982 Long term (current) use of aspirin: Secondary | ICD-10-CM | POA: Diagnosis not present

## 2016-09-10 DIAGNOSIS — S12090A Other displaced fracture of first cervical vertebra, initial encounter for closed fracture: Secondary | ICD-10-CM | POA: Diagnosis not present

## 2016-09-10 DIAGNOSIS — W19XXXA Unspecified fall, initial encounter: Secondary | ICD-10-CM | POA: Diagnosis not present

## 2016-09-10 DIAGNOSIS — N179 Acute kidney failure, unspecified: Secondary | ICD-10-CM | POA: Diagnosis not present

## 2016-09-10 DIAGNOSIS — R55 Syncope and collapse: Secondary | ICD-10-CM | POA: Diagnosis not present

## 2016-09-10 DIAGNOSIS — S12030A Displaced posterior arch fracture of first cervical vertebra, initial encounter for closed fracture: Secondary | ICD-10-CM | POA: Diagnosis not present

## 2016-09-10 DIAGNOSIS — S12112A Nondisplaced Type II dens fracture, initial encounter for closed fracture: Secondary | ICD-10-CM | POA: Diagnosis not present

## 2016-09-10 DIAGNOSIS — Z950 Presence of cardiac pacemaker: Secondary | ICD-10-CM | POA: Diagnosis not present

## 2016-09-10 DIAGNOSIS — I4891 Unspecified atrial fibrillation: Secondary | ICD-10-CM | POA: Diagnosis not present

## 2016-09-10 DIAGNOSIS — K219 Gastro-esophageal reflux disease without esophagitis: Secondary | ICD-10-CM | POA: Diagnosis not present

## 2016-09-10 DIAGNOSIS — S12110A Anterior displaced Type II dens fracture, initial encounter for closed fracture: Secondary | ICD-10-CM | POA: Diagnosis not present

## 2016-09-10 DIAGNOSIS — R2681 Unsteadiness on feet: Secondary | ICD-10-CM | POA: Diagnosis not present

## 2016-09-11 DIAGNOSIS — I082 Rheumatic disorders of both aortic and tricuspid valves: Secondary | ICD-10-CM | POA: Diagnosis not present

## 2016-09-11 DIAGNOSIS — S12112A Nondisplaced Type II dens fracture, initial encounter for closed fracture: Secondary | ICD-10-CM | POA: Diagnosis not present

## 2016-09-11 DIAGNOSIS — W19XXXA Unspecified fall, initial encounter: Secondary | ICD-10-CM | POA: Diagnosis not present

## 2016-09-11 DIAGNOSIS — R55 Syncope and collapse: Secondary | ICD-10-CM | POA: Diagnosis not present

## 2016-09-11 DIAGNOSIS — Z950 Presence of cardiac pacemaker: Secondary | ICD-10-CM | POA: Diagnosis not present

## 2016-09-11 DIAGNOSIS — I4891 Unspecified atrial fibrillation: Secondary | ICD-10-CM | POA: Diagnosis not present

## 2016-09-11 DIAGNOSIS — S12030A Displaced posterior arch fracture of first cervical vertebra, initial encounter for closed fracture: Secondary | ICD-10-CM | POA: Diagnosis not present

## 2016-09-12 DIAGNOSIS — R55 Syncope and collapse: Secondary | ICD-10-CM | POA: Diagnosis not present

## 2016-09-12 DIAGNOSIS — R51 Headache: Secondary | ICD-10-CM | POA: Diagnosis not present

## 2016-09-12 DIAGNOSIS — W19XXXA Unspecified fall, initial encounter: Secondary | ICD-10-CM | POA: Diagnosis not present

## 2016-09-12 DIAGNOSIS — S12112A Nondisplaced Type II dens fracture, initial encounter for closed fracture: Secondary | ICD-10-CM | POA: Diagnosis not present

## 2016-09-13 DIAGNOSIS — M1991 Primary osteoarthritis, unspecified site: Secondary | ICD-10-CM | POA: Diagnosis not present

## 2016-09-13 DIAGNOSIS — S12000D Unspecified displaced fracture of first cervical vertebra, subsequent encounter for fracture with routine healing: Secondary | ICD-10-CM | POA: Diagnosis not present

## 2016-09-13 DIAGNOSIS — S12112D Nondisplaced Type II dens fracture, subsequent encounter for fracture with routine healing: Secondary | ICD-10-CM | POA: Diagnosis not present

## 2016-09-13 DIAGNOSIS — W19XXXD Unspecified fall, subsequent encounter: Secondary | ICD-10-CM | POA: Diagnosis not present

## 2016-09-13 DIAGNOSIS — N189 Chronic kidney disease, unspecified: Secondary | ICD-10-CM | POA: Diagnosis not present

## 2016-09-13 DIAGNOSIS — M4050 Lordosis, unspecified, site unspecified: Secondary | ICD-10-CM | POA: Diagnosis not present

## 2016-09-13 DIAGNOSIS — Z950 Presence of cardiac pacemaker: Secondary | ICD-10-CM | POA: Diagnosis not present

## 2016-09-13 DIAGNOSIS — Z7982 Long term (current) use of aspirin: Secondary | ICD-10-CM | POA: Diagnosis not present

## 2016-09-13 DIAGNOSIS — I4891 Unspecified atrial fibrillation: Secondary | ICD-10-CM | POA: Diagnosis not present

## 2016-09-14 DIAGNOSIS — Z79899 Other long term (current) drug therapy: Secondary | ICD-10-CM | POA: Diagnosis not present

## 2016-09-14 DIAGNOSIS — Z681 Body mass index (BMI) 19 or less, adult: Secondary | ICD-10-CM | POA: Diagnosis not present

## 2016-09-14 DIAGNOSIS — S129XXA Fracture of neck, unspecified, initial encounter: Secondary | ICD-10-CM | POA: Diagnosis not present

## 2016-09-15 DIAGNOSIS — M542 Cervicalgia: Secondary | ICD-10-CM | POA: Diagnosis not present

## 2016-09-15 DIAGNOSIS — S129XXD Fracture of neck, unspecified, subsequent encounter: Secondary | ICD-10-CM | POA: Diagnosis not present

## 2016-09-15 DIAGNOSIS — M1991 Primary osteoarthritis, unspecified site: Secondary | ICD-10-CM | POA: Diagnosis not present

## 2016-09-15 DIAGNOSIS — N189 Chronic kidney disease, unspecified: Secondary | ICD-10-CM | POA: Diagnosis not present

## 2016-09-15 DIAGNOSIS — W19XXXD Unspecified fall, subsequent encounter: Secondary | ICD-10-CM | POA: Diagnosis not present

## 2016-09-15 DIAGNOSIS — S12000D Unspecified displaced fracture of first cervical vertebra, subsequent encounter for fracture with routine healing: Secondary | ICD-10-CM | POA: Diagnosis not present

## 2016-09-15 DIAGNOSIS — M4050 Lordosis, unspecified, site unspecified: Secondary | ICD-10-CM | POA: Diagnosis not present

## 2016-09-15 DIAGNOSIS — S12112D Nondisplaced Type II dens fracture, subsequent encounter for fracture with routine healing: Secondary | ICD-10-CM | POA: Diagnosis not present

## 2016-09-18 DIAGNOSIS — S12000D Unspecified displaced fracture of first cervical vertebra, subsequent encounter for fracture with routine healing: Secondary | ICD-10-CM | POA: Diagnosis not present

## 2016-09-18 DIAGNOSIS — W19XXXD Unspecified fall, subsequent encounter: Secondary | ICD-10-CM | POA: Diagnosis not present

## 2016-09-18 DIAGNOSIS — N189 Chronic kidney disease, unspecified: Secondary | ICD-10-CM | POA: Diagnosis not present

## 2016-09-18 DIAGNOSIS — S12112D Nondisplaced Type II dens fracture, subsequent encounter for fracture with routine healing: Secondary | ICD-10-CM | POA: Diagnosis not present

## 2016-09-18 DIAGNOSIS — M1991 Primary osteoarthritis, unspecified site: Secondary | ICD-10-CM | POA: Diagnosis not present

## 2016-09-18 DIAGNOSIS — M4050 Lordosis, unspecified, site unspecified: Secondary | ICD-10-CM | POA: Diagnosis not present

## 2016-09-19 DIAGNOSIS — N189 Chronic kidney disease, unspecified: Secondary | ICD-10-CM | POA: Diagnosis not present

## 2016-09-19 DIAGNOSIS — W19XXXD Unspecified fall, subsequent encounter: Secondary | ICD-10-CM | POA: Diagnosis not present

## 2016-09-19 DIAGNOSIS — M4050 Lordosis, unspecified, site unspecified: Secondary | ICD-10-CM | POA: Diagnosis not present

## 2016-09-19 DIAGNOSIS — S12000D Unspecified displaced fracture of first cervical vertebra, subsequent encounter for fracture with routine healing: Secondary | ICD-10-CM | POA: Diagnosis not present

## 2016-09-19 DIAGNOSIS — S12112D Nondisplaced Type II dens fracture, subsequent encounter for fracture with routine healing: Secondary | ICD-10-CM | POA: Diagnosis not present

## 2016-09-19 DIAGNOSIS — M1991 Primary osteoarthritis, unspecified site: Secondary | ICD-10-CM | POA: Diagnosis not present

## 2016-09-20 DIAGNOSIS — S12112D Nondisplaced Type II dens fracture, subsequent encounter for fracture with routine healing: Secondary | ICD-10-CM | POA: Diagnosis not present

## 2016-09-20 DIAGNOSIS — W19XXXD Unspecified fall, subsequent encounter: Secondary | ICD-10-CM | POA: Diagnosis not present

## 2016-09-20 DIAGNOSIS — S12000D Unspecified displaced fracture of first cervical vertebra, subsequent encounter for fracture with routine healing: Secondary | ICD-10-CM | POA: Diagnosis not present

## 2016-09-20 DIAGNOSIS — M1991 Primary osteoarthritis, unspecified site: Secondary | ICD-10-CM | POA: Diagnosis not present

## 2016-09-20 DIAGNOSIS — M4050 Lordosis, unspecified, site unspecified: Secondary | ICD-10-CM | POA: Diagnosis not present

## 2016-09-20 DIAGNOSIS — N189 Chronic kidney disease, unspecified: Secondary | ICD-10-CM | POA: Diagnosis not present

## 2016-09-21 DIAGNOSIS — R0902 Hypoxemia: Secondary | ICD-10-CM | POA: Diagnosis not present

## 2016-09-21 DIAGNOSIS — T402X5A Adverse effect of other opioids, initial encounter: Secondary | ICD-10-CM | POA: Diagnosis not present

## 2016-09-21 DIAGNOSIS — R202 Paresthesia of skin: Secondary | ICD-10-CM | POA: Diagnosis not present

## 2016-09-21 DIAGNOSIS — R4182 Altered mental status, unspecified: Secondary | ICD-10-CM | POA: Diagnosis not present

## 2016-09-22 DIAGNOSIS — N189 Chronic kidney disease, unspecified: Secondary | ICD-10-CM | POA: Diagnosis not present

## 2016-09-22 DIAGNOSIS — W19XXXD Unspecified fall, subsequent encounter: Secondary | ICD-10-CM | POA: Diagnosis not present

## 2016-09-22 DIAGNOSIS — S12112D Nondisplaced Type II dens fracture, subsequent encounter for fracture with routine healing: Secondary | ICD-10-CM | POA: Diagnosis not present

## 2016-09-22 DIAGNOSIS — M4050 Lordosis, unspecified, site unspecified: Secondary | ICD-10-CM | POA: Diagnosis not present

## 2016-09-22 DIAGNOSIS — M1991 Primary osteoarthritis, unspecified site: Secondary | ICD-10-CM | POA: Diagnosis not present

## 2016-09-22 DIAGNOSIS — S12000D Unspecified displaced fracture of first cervical vertebra, subsequent encounter for fracture with routine healing: Secondary | ICD-10-CM | POA: Diagnosis not present

## 2016-09-26 DIAGNOSIS — S12112D Nondisplaced Type II dens fracture, subsequent encounter for fracture with routine healing: Secondary | ICD-10-CM | POA: Diagnosis not present

## 2016-09-26 DIAGNOSIS — M4050 Lordosis, unspecified, site unspecified: Secondary | ICD-10-CM | POA: Diagnosis not present

## 2016-09-26 DIAGNOSIS — M1991 Primary osteoarthritis, unspecified site: Secondary | ICD-10-CM | POA: Diagnosis not present

## 2016-09-26 DIAGNOSIS — S12000D Unspecified displaced fracture of first cervical vertebra, subsequent encounter for fracture with routine healing: Secondary | ICD-10-CM | POA: Diagnosis not present

## 2016-09-26 DIAGNOSIS — N189 Chronic kidney disease, unspecified: Secondary | ICD-10-CM | POA: Diagnosis not present

## 2016-09-26 DIAGNOSIS — W19XXXD Unspecified fall, subsequent encounter: Secondary | ICD-10-CM | POA: Diagnosis not present

## 2016-09-27 DIAGNOSIS — S12090A Other displaced fracture of first cervical vertebra, initial encounter for closed fracture: Secondary | ICD-10-CM | POA: Diagnosis not present

## 2016-09-27 DIAGNOSIS — S12111A Posterior displaced Type II dens fracture, initial encounter for closed fracture: Secondary | ICD-10-CM | POA: Diagnosis not present

## 2016-09-28 DIAGNOSIS — S12112D Nondisplaced Type II dens fracture, subsequent encounter for fracture with routine healing: Secondary | ICD-10-CM | POA: Diagnosis not present

## 2016-09-28 DIAGNOSIS — M1991 Primary osteoarthritis, unspecified site: Secondary | ICD-10-CM | POA: Diagnosis not present

## 2016-09-28 DIAGNOSIS — M4050 Lordosis, unspecified, site unspecified: Secondary | ICD-10-CM | POA: Diagnosis not present

## 2016-09-28 DIAGNOSIS — N189 Chronic kidney disease, unspecified: Secondary | ICD-10-CM | POA: Diagnosis not present

## 2016-09-28 DIAGNOSIS — W19XXXD Unspecified fall, subsequent encounter: Secondary | ICD-10-CM | POA: Diagnosis not present

## 2016-09-28 DIAGNOSIS — S12000D Unspecified displaced fracture of first cervical vertebra, subsequent encounter for fracture with routine healing: Secondary | ICD-10-CM | POA: Diagnosis not present

## 2016-09-29 DIAGNOSIS — S12000D Unspecified displaced fracture of first cervical vertebra, subsequent encounter for fracture with routine healing: Secondary | ICD-10-CM | POA: Diagnosis not present

## 2016-09-29 DIAGNOSIS — W19XXXD Unspecified fall, subsequent encounter: Secondary | ICD-10-CM | POA: Diagnosis not present

## 2016-09-29 DIAGNOSIS — N189 Chronic kidney disease, unspecified: Secondary | ICD-10-CM | POA: Diagnosis not present

## 2016-09-29 DIAGNOSIS — M1991 Primary osteoarthritis, unspecified site: Secondary | ICD-10-CM | POA: Diagnosis not present

## 2016-09-29 DIAGNOSIS — S12112D Nondisplaced Type II dens fracture, subsequent encounter for fracture with routine healing: Secondary | ICD-10-CM | POA: Diagnosis not present

## 2016-09-29 DIAGNOSIS — M4050 Lordosis, unspecified, site unspecified: Secondary | ICD-10-CM | POA: Diagnosis not present

## 2016-10-03 DIAGNOSIS — Z681 Body mass index (BMI) 19 or less, adult: Secondary | ICD-10-CM | POA: Diagnosis not present

## 2016-10-03 DIAGNOSIS — S022XXA Fracture of nasal bones, initial encounter for closed fracture: Secondary | ICD-10-CM | POA: Diagnosis not present

## 2016-10-03 DIAGNOSIS — R0981 Nasal congestion: Secondary | ICD-10-CM | POA: Diagnosis not present

## 2016-10-04 DIAGNOSIS — S022XXA Fracture of nasal bones, initial encounter for closed fracture: Secondary | ICD-10-CM | POA: Diagnosis not present

## 2016-10-04 DIAGNOSIS — H01002 Unspecified blepharitis right lower eyelid: Secondary | ICD-10-CM | POA: Diagnosis not present

## 2016-10-04 DIAGNOSIS — Z681 Body mass index (BMI) 19 or less, adult: Secondary | ICD-10-CM | POA: Diagnosis not present

## 2016-10-04 DIAGNOSIS — H04123 Dry eye syndrome of bilateral lacrimal glands: Secondary | ICD-10-CM | POA: Diagnosis not present

## 2016-10-04 DIAGNOSIS — H01005 Unspecified blepharitis left lower eyelid: Secondary | ICD-10-CM | POA: Diagnosis not present

## 2016-10-04 DIAGNOSIS — H35351 Cystoid macular degeneration, right eye: Secondary | ICD-10-CM | POA: Diagnosis not present

## 2016-10-04 DIAGNOSIS — R0981 Nasal congestion: Secondary | ICD-10-CM | POA: Diagnosis not present

## 2016-10-05 DIAGNOSIS — S12112D Nondisplaced Type II dens fracture, subsequent encounter for fracture with routine healing: Secondary | ICD-10-CM | POA: Diagnosis not present

## 2016-10-05 DIAGNOSIS — N189 Chronic kidney disease, unspecified: Secondary | ICD-10-CM | POA: Diagnosis not present

## 2016-10-05 DIAGNOSIS — S12000D Unspecified displaced fracture of first cervical vertebra, subsequent encounter for fracture with routine healing: Secondary | ICD-10-CM | POA: Diagnosis not present

## 2016-10-05 DIAGNOSIS — M1991 Primary osteoarthritis, unspecified site: Secondary | ICD-10-CM | POA: Diagnosis not present

## 2016-10-05 DIAGNOSIS — M4050 Lordosis, unspecified, site unspecified: Secondary | ICD-10-CM | POA: Diagnosis not present

## 2016-10-05 DIAGNOSIS — W19XXXD Unspecified fall, subsequent encounter: Secondary | ICD-10-CM | POA: Diagnosis not present

## 2016-10-07 DIAGNOSIS — S12112D Nondisplaced Type II dens fracture, subsequent encounter for fracture with routine healing: Secondary | ICD-10-CM | POA: Diagnosis not present

## 2016-10-07 DIAGNOSIS — M4050 Lordosis, unspecified, site unspecified: Secondary | ICD-10-CM | POA: Diagnosis not present

## 2016-10-07 DIAGNOSIS — M1991 Primary osteoarthritis, unspecified site: Secondary | ICD-10-CM | POA: Diagnosis not present

## 2016-10-07 DIAGNOSIS — N189 Chronic kidney disease, unspecified: Secondary | ICD-10-CM | POA: Diagnosis not present

## 2016-10-07 DIAGNOSIS — W19XXXD Unspecified fall, subsequent encounter: Secondary | ICD-10-CM | POA: Diagnosis not present

## 2016-10-07 DIAGNOSIS — S12000D Unspecified displaced fracture of first cervical vertebra, subsequent encounter for fracture with routine healing: Secondary | ICD-10-CM | POA: Diagnosis not present

## 2016-10-10 DIAGNOSIS — Z885 Allergy status to narcotic agent status: Secondary | ICD-10-CM | POA: Diagnosis not present

## 2016-10-10 DIAGNOSIS — M8588 Other specified disorders of bone density and structure, other site: Secondary | ICD-10-CM | POA: Diagnosis not present

## 2016-10-10 DIAGNOSIS — S12100D Unspecified displaced fracture of second cervical vertebra, subsequent encounter for fracture with routine healing: Secondary | ICD-10-CM | POA: Diagnosis not present

## 2016-10-10 DIAGNOSIS — M810 Age-related osteoporosis without current pathological fracture: Secondary | ICD-10-CM | POA: Diagnosis not present

## 2016-10-10 DIAGNOSIS — S1201XD Stable burst fracture of first cervical vertebra, subsequent encounter for fracture with routine healing: Secondary | ICD-10-CM | POA: Diagnosis not present

## 2016-10-10 DIAGNOSIS — M5030 Other cervical disc degeneration, unspecified cervical region: Secondary | ICD-10-CM | POA: Diagnosis not present

## 2016-10-10 DIAGNOSIS — S12000D Unspecified displaced fracture of first cervical vertebra, subsequent encounter for fracture with routine healing: Secondary | ICD-10-CM | POA: Diagnosis not present

## 2016-10-10 DIAGNOSIS — M4692 Unspecified inflammatory spondylopathy, cervical region: Secondary | ICD-10-CM | POA: Diagnosis not present

## 2016-10-10 DIAGNOSIS — S12112D Nondisplaced Type II dens fracture, subsequent encounter for fracture with routine healing: Secondary | ICD-10-CM | POA: Diagnosis not present

## 2016-10-12 DIAGNOSIS — M1991 Primary osteoarthritis, unspecified site: Secondary | ICD-10-CM | POA: Diagnosis not present

## 2016-10-12 DIAGNOSIS — W19XXXD Unspecified fall, subsequent encounter: Secondary | ICD-10-CM | POA: Diagnosis not present

## 2016-10-12 DIAGNOSIS — N189 Chronic kidney disease, unspecified: Secondary | ICD-10-CM | POA: Diagnosis not present

## 2016-10-12 DIAGNOSIS — S12000D Unspecified displaced fracture of first cervical vertebra, subsequent encounter for fracture with routine healing: Secondary | ICD-10-CM | POA: Diagnosis not present

## 2016-10-12 DIAGNOSIS — M4050 Lordosis, unspecified, site unspecified: Secondary | ICD-10-CM | POA: Diagnosis not present

## 2016-10-12 DIAGNOSIS — S12112D Nondisplaced Type II dens fracture, subsequent encounter for fracture with routine healing: Secondary | ICD-10-CM | POA: Diagnosis not present

## 2016-10-17 DIAGNOSIS — S12112D Nondisplaced Type II dens fracture, subsequent encounter for fracture with routine healing: Secondary | ICD-10-CM | POA: Diagnosis not present

## 2016-10-17 DIAGNOSIS — N189 Chronic kidney disease, unspecified: Secondary | ICD-10-CM | POA: Diagnosis not present

## 2016-10-17 DIAGNOSIS — J310 Chronic rhinitis: Secondary | ICD-10-CM | POA: Diagnosis not present

## 2016-10-17 DIAGNOSIS — Z681 Body mass index (BMI) 19 or less, adult: Secondary | ICD-10-CM | POA: Diagnosis not present

## 2016-10-17 DIAGNOSIS — M1991 Primary osteoarthritis, unspecified site: Secondary | ICD-10-CM | POA: Diagnosis not present

## 2016-10-17 DIAGNOSIS — W19XXXD Unspecified fall, subsequent encounter: Secondary | ICD-10-CM | POA: Diagnosis not present

## 2016-10-17 DIAGNOSIS — M4050 Lordosis, unspecified, site unspecified: Secondary | ICD-10-CM | POA: Diagnosis not present

## 2016-10-17 DIAGNOSIS — S12000D Unspecified displaced fracture of first cervical vertebra, subsequent encounter for fracture with routine healing: Secondary | ICD-10-CM | POA: Diagnosis not present

## 2016-10-24 DIAGNOSIS — S12112D Nondisplaced Type II dens fracture, subsequent encounter for fracture with routine healing: Secondary | ICD-10-CM | POA: Diagnosis not present

## 2016-10-24 DIAGNOSIS — W19XXXD Unspecified fall, subsequent encounter: Secondary | ICD-10-CM | POA: Diagnosis not present

## 2016-10-24 DIAGNOSIS — M4050 Lordosis, unspecified, site unspecified: Secondary | ICD-10-CM | POA: Diagnosis not present

## 2016-10-24 DIAGNOSIS — S12000D Unspecified displaced fracture of first cervical vertebra, subsequent encounter for fracture with routine healing: Secondary | ICD-10-CM | POA: Diagnosis not present

## 2016-10-24 DIAGNOSIS — N189 Chronic kidney disease, unspecified: Secondary | ICD-10-CM | POA: Diagnosis not present

## 2016-10-24 DIAGNOSIS — M1991 Primary osteoarthritis, unspecified site: Secondary | ICD-10-CM | POA: Diagnosis not present

## 2016-10-25 DIAGNOSIS — S12090A Other displaced fracture of first cervical vertebra, initial encounter for closed fracture: Secondary | ICD-10-CM | POA: Diagnosis not present

## 2016-10-25 DIAGNOSIS — S12111A Posterior displaced Type II dens fracture, initial encounter for closed fracture: Secondary | ICD-10-CM | POA: Diagnosis not present

## 2016-10-25 DIAGNOSIS — H35351 Cystoid macular degeneration, right eye: Secondary | ICD-10-CM | POA: Diagnosis not present

## 2016-10-26 DIAGNOSIS — M1991 Primary osteoarthritis, unspecified site: Secondary | ICD-10-CM | POA: Diagnosis not present

## 2016-10-26 DIAGNOSIS — N189 Chronic kidney disease, unspecified: Secondary | ICD-10-CM | POA: Diagnosis not present

## 2016-10-26 DIAGNOSIS — W19XXXD Unspecified fall, subsequent encounter: Secondary | ICD-10-CM | POA: Diagnosis not present

## 2016-10-26 DIAGNOSIS — S12000D Unspecified displaced fracture of first cervical vertebra, subsequent encounter for fracture with routine healing: Secondary | ICD-10-CM | POA: Diagnosis not present

## 2016-10-26 DIAGNOSIS — M4050 Lordosis, unspecified, site unspecified: Secondary | ICD-10-CM | POA: Diagnosis not present

## 2016-10-26 DIAGNOSIS — S12112D Nondisplaced Type II dens fracture, subsequent encounter for fracture with routine healing: Secondary | ICD-10-CM | POA: Diagnosis not present

## 2016-10-30 DIAGNOSIS — S12110D Anterior displaced Type II dens fracture, subsequent encounter for fracture with routine healing: Secondary | ICD-10-CM | POA: Diagnosis not present

## 2016-10-30 DIAGNOSIS — M5380 Other specified dorsopathies, site unspecified: Secondary | ICD-10-CM | POA: Diagnosis not present

## 2016-10-30 DIAGNOSIS — S22000A Wedge compression fracture of unspecified thoracic vertebra, initial encounter for closed fracture: Secondary | ICD-10-CM | POA: Diagnosis not present

## 2016-10-30 DIAGNOSIS — S12001D Unspecified nondisplaced fracture of first cervical vertebra, subsequent encounter for fracture with routine healing: Secondary | ICD-10-CM | POA: Diagnosis not present

## 2016-10-30 DIAGNOSIS — M5030 Other cervical disc degeneration, unspecified cervical region: Secondary | ICD-10-CM | POA: Diagnosis not present

## 2016-10-30 DIAGNOSIS — S12111D Posterior displaced Type II dens fracture, subsequent encounter for fracture with routine healing: Secondary | ICD-10-CM | POA: Diagnosis not present

## 2016-10-30 DIAGNOSIS — S12030D Displaced posterior arch fracture of first cervical vertebra, subsequent encounter for fracture with routine healing: Secondary | ICD-10-CM | POA: Diagnosis not present

## 2016-10-30 DIAGNOSIS — M4014 Other secondary kyphosis, thoracic region: Secondary | ICD-10-CM | POA: Diagnosis not present

## 2016-11-01 DIAGNOSIS — Z9181 History of falling: Secondary | ICD-10-CM | POA: Diagnosis not present

## 2016-11-01 DIAGNOSIS — Z136 Encounter for screening for cardiovascular disorders: Secondary | ICD-10-CM | POA: Diagnosis not present

## 2016-11-01 DIAGNOSIS — N959 Unspecified menopausal and perimenopausal disorder: Secondary | ICD-10-CM | POA: Diagnosis not present

## 2016-11-01 DIAGNOSIS — R6889 Other general symptoms and signs: Secondary | ICD-10-CM | POA: Diagnosis not present

## 2016-11-01 DIAGNOSIS — Z0000 Encounter for general adult medical examination without abnormal findings: Secondary | ICD-10-CM | POA: Diagnosis not present

## 2016-11-01 DIAGNOSIS — Z1389 Encounter for screening for other disorder: Secondary | ICD-10-CM | POA: Diagnosis not present

## 2016-11-02 DIAGNOSIS — W19XXXD Unspecified fall, subsequent encounter: Secondary | ICD-10-CM | POA: Diagnosis not present

## 2016-11-02 DIAGNOSIS — S12000D Unspecified displaced fracture of first cervical vertebra, subsequent encounter for fracture with routine healing: Secondary | ICD-10-CM | POA: Diagnosis not present

## 2016-11-02 DIAGNOSIS — M1991 Primary osteoarthritis, unspecified site: Secondary | ICD-10-CM | POA: Diagnosis not present

## 2016-11-02 DIAGNOSIS — N189 Chronic kidney disease, unspecified: Secondary | ICD-10-CM | POA: Diagnosis not present

## 2016-11-02 DIAGNOSIS — M4050 Lordosis, unspecified, site unspecified: Secondary | ICD-10-CM | POA: Diagnosis not present

## 2016-11-02 DIAGNOSIS — S12112D Nondisplaced Type II dens fracture, subsequent encounter for fracture with routine healing: Secondary | ICD-10-CM | POA: Diagnosis not present

## 2016-11-03 DIAGNOSIS — S12000D Unspecified displaced fracture of first cervical vertebra, subsequent encounter for fracture with routine healing: Secondary | ICD-10-CM | POA: Diagnosis not present

## 2016-11-03 DIAGNOSIS — N189 Chronic kidney disease, unspecified: Secondary | ICD-10-CM | POA: Diagnosis not present

## 2016-11-03 DIAGNOSIS — W19XXXD Unspecified fall, subsequent encounter: Secondary | ICD-10-CM | POA: Diagnosis not present

## 2016-11-03 DIAGNOSIS — M1991 Primary osteoarthritis, unspecified site: Secondary | ICD-10-CM | POA: Diagnosis not present

## 2016-11-03 DIAGNOSIS — S12112D Nondisplaced Type II dens fracture, subsequent encounter for fracture with routine healing: Secondary | ICD-10-CM | POA: Diagnosis not present

## 2016-11-03 DIAGNOSIS — M4050 Lordosis, unspecified, site unspecified: Secondary | ICD-10-CM | POA: Diagnosis not present

## 2016-11-14 DIAGNOSIS — S12001D Unspecified nondisplaced fracture of first cervical vertebra, subsequent encounter for fracture with routine healing: Secondary | ICD-10-CM | POA: Diagnosis not present

## 2016-11-14 DIAGNOSIS — S12101D Unspecified nondisplaced fracture of second cervical vertebra, subsequent encounter for fracture with routine healing: Secondary | ICD-10-CM | POA: Diagnosis not present

## 2016-11-14 DIAGNOSIS — M5030 Other cervical disc degeneration, unspecified cervical region: Secondary | ICD-10-CM | POA: Diagnosis not present

## 2016-11-14 DIAGNOSIS — S22000D Wedge compression fracture of unspecified thoracic vertebra, subsequent encounter for fracture with routine healing: Secondary | ICD-10-CM | POA: Diagnosis not present

## 2016-11-14 DIAGNOSIS — M5380 Other specified dorsopathies, site unspecified: Secondary | ICD-10-CM | POA: Diagnosis not present

## 2016-11-14 DIAGNOSIS — S12031D Nondisplaced posterior arch fracture of first cervical vertebra, subsequent encounter for fracture with routine healing: Secondary | ICD-10-CM | POA: Diagnosis not present

## 2016-11-14 DIAGNOSIS — I6523 Occlusion and stenosis of bilateral carotid arteries: Secondary | ICD-10-CM | POA: Diagnosis not present

## 2016-11-14 DIAGNOSIS — S12112D Nondisplaced Type II dens fracture, subsequent encounter for fracture with routine healing: Secondary | ICD-10-CM | POA: Diagnosis not present

## 2016-11-20 DIAGNOSIS — M545 Low back pain: Secondary | ICD-10-CM | POA: Diagnosis not present

## 2016-11-20 DIAGNOSIS — H35351 Cystoid macular degeneration, right eye: Secondary | ICD-10-CM | POA: Diagnosis not present

## 2016-11-20 DIAGNOSIS — S7002XA Contusion of left hip, initial encounter: Secondary | ICD-10-CM | POA: Diagnosis not present

## 2016-11-20 DIAGNOSIS — Z9181 History of falling: Secondary | ICD-10-CM | POA: Diagnosis not present

## 2016-11-20 DIAGNOSIS — M25552 Pain in left hip: Secondary | ICD-10-CM | POA: Diagnosis not present

## 2016-11-20 DIAGNOSIS — M4850XA Collapsed vertebra, not elsewhere classified, site unspecified, initial encounter for fracture: Secondary | ICD-10-CM | POA: Diagnosis not present

## 2016-11-20 DIAGNOSIS — S79912A Unspecified injury of left hip, initial encounter: Secondary | ICD-10-CM | POA: Diagnosis not present

## 2016-11-22 DIAGNOSIS — Z6830 Body mass index (BMI) 30.0-30.9, adult: Secondary | ICD-10-CM | POA: Diagnosis not present

## 2016-11-22 DIAGNOSIS — K5900 Constipation, unspecified: Secondary | ICD-10-CM | POA: Diagnosis not present

## 2016-11-24 DIAGNOSIS — S22080A Wedge compression fracture of T11-T12 vertebra, initial encounter for closed fracture: Secondary | ICD-10-CM | POA: Diagnosis not present

## 2016-11-27 DIAGNOSIS — Z885 Allergy status to narcotic agent status: Secondary | ICD-10-CM | POA: Diagnosis not present

## 2016-11-27 DIAGNOSIS — M5030 Other cervical disc degeneration, unspecified cervical region: Secondary | ICD-10-CM | POA: Diagnosis not present

## 2016-11-27 DIAGNOSIS — S12101D Unspecified nondisplaced fracture of second cervical vertebra, subsequent encounter for fracture with routine healing: Secondary | ICD-10-CM | POA: Diagnosis not present

## 2016-11-27 DIAGNOSIS — S12001D Unspecified nondisplaced fracture of first cervical vertebra, subsequent encounter for fracture with routine healing: Secondary | ICD-10-CM | POA: Diagnosis not present

## 2016-11-27 DIAGNOSIS — S22000D Wedge compression fracture of unspecified thoracic vertebra, subsequent encounter for fracture with routine healing: Secondary | ICD-10-CM | POA: Diagnosis not present

## 2016-11-27 DIAGNOSIS — M4183 Other forms of scoliosis, cervicothoracic region: Secondary | ICD-10-CM | POA: Diagnosis not present

## 2016-11-27 DIAGNOSIS — M5380 Other specified dorsopathies, site unspecified: Secondary | ICD-10-CM | POA: Diagnosis not present

## 2016-11-29 DIAGNOSIS — Z6820 Body mass index (BMI) 20.0-20.9, adult: Secondary | ICD-10-CM | POA: Diagnosis not present

## 2016-11-29 DIAGNOSIS — M810 Age-related osteoporosis without current pathological fracture: Secondary | ICD-10-CM | POA: Diagnosis not present

## 2016-11-29 DIAGNOSIS — I4891 Unspecified atrial fibrillation: Secondary | ICD-10-CM | POA: Diagnosis not present

## 2016-11-29 DIAGNOSIS — E559 Vitamin D deficiency, unspecified: Secondary | ICD-10-CM | POA: Diagnosis not present

## 2016-11-29 DIAGNOSIS — E7800 Pure hypercholesterolemia, unspecified: Secondary | ICD-10-CM | POA: Diagnosis not present

## 2016-11-29 DIAGNOSIS — Z79899 Other long term (current) drug therapy: Secondary | ICD-10-CM | POA: Diagnosis not present

## 2016-11-29 DIAGNOSIS — M159 Polyosteoarthritis, unspecified: Secondary | ICD-10-CM | POA: Diagnosis not present

## 2016-12-01 DIAGNOSIS — Z6821 Body mass index (BMI) 21.0-21.9, adult: Secondary | ICD-10-CM | POA: Diagnosis not present

## 2016-12-01 DIAGNOSIS — R6889 Other general symptoms and signs: Secondary | ICD-10-CM | POA: Diagnosis not present

## 2016-12-12 DIAGNOSIS — M810 Age-related osteoporosis without current pathological fracture: Secondary | ICD-10-CM | POA: Diagnosis not present

## 2016-12-21 DIAGNOSIS — R0789 Other chest pain: Secondary | ICD-10-CM | POA: Diagnosis not present

## 2016-12-21 DIAGNOSIS — Z950 Presence of cardiac pacemaker: Secondary | ICD-10-CM | POA: Diagnosis not present

## 2016-12-21 DIAGNOSIS — R001 Bradycardia, unspecified: Secondary | ICD-10-CM | POA: Diagnosis not present

## 2016-12-21 DIAGNOSIS — Z681 Body mass index (BMI) 19 or less, adult: Secondary | ICD-10-CM | POA: Diagnosis not present

## 2016-12-26 DIAGNOSIS — I6789 Other cerebrovascular disease: Secondary | ICD-10-CM | POA: Diagnosis not present

## 2016-12-26 DIAGNOSIS — R51 Headache: Secondary | ICD-10-CM | POA: Diagnosis not present

## 2016-12-26 DIAGNOSIS — M542 Cervicalgia: Secondary | ICD-10-CM | POA: Diagnosis not present

## 2016-12-26 DIAGNOSIS — S12111A Posterior displaced Type II dens fracture, initial encounter for closed fracture: Secondary | ICD-10-CM | POA: Diagnosis not present

## 2016-12-26 DIAGNOSIS — S129XXD Fracture of neck, unspecified, subsequent encounter: Secondary | ICD-10-CM | POA: Diagnosis not present

## 2016-12-26 DIAGNOSIS — S1201XG Stable burst fracture of first cervical vertebra, subsequent encounter for fracture with delayed healing: Secondary | ICD-10-CM | POA: Diagnosis not present

## 2017-01-01 DIAGNOSIS — M25471 Effusion, right ankle: Secondary | ICD-10-CM | POA: Diagnosis not present

## 2017-01-01 DIAGNOSIS — I4891 Unspecified atrial fibrillation: Secondary | ICD-10-CM | POA: Diagnosis not present

## 2017-01-01 DIAGNOSIS — Z6822 Body mass index (BMI) 22.0-22.9, adult: Secondary | ICD-10-CM | POA: Diagnosis not present

## 2017-01-02 DIAGNOSIS — M8580 Other specified disorders of bone density and structure, unspecified site: Secondary | ICD-10-CM | POA: Diagnosis not present

## 2017-01-02 DIAGNOSIS — S12001D Unspecified nondisplaced fracture of first cervical vertebra, subsequent encounter for fracture with routine healing: Secondary | ICD-10-CM | POA: Diagnosis not present

## 2017-01-02 DIAGNOSIS — S12031D Nondisplaced posterior arch fracture of first cervical vertebra, subsequent encounter for fracture with routine healing: Secondary | ICD-10-CM | POA: Diagnosis not present

## 2017-01-02 DIAGNOSIS — M5030 Other cervical disc degeneration, unspecified cervical region: Secondary | ICD-10-CM | POA: Diagnosis not present

## 2017-01-02 DIAGNOSIS — M5382 Other specified dorsopathies, cervical region: Secondary | ICD-10-CM | POA: Diagnosis not present

## 2017-01-02 DIAGNOSIS — S12110D Anterior displaced Type II dens fracture, subsequent encounter for fracture with routine healing: Secondary | ICD-10-CM | POA: Diagnosis not present

## 2017-01-09 DIAGNOSIS — R51 Headache: Secondary | ICD-10-CM | POA: Diagnosis not present

## 2017-01-09 DIAGNOSIS — N289 Disorder of kidney and ureter, unspecified: Secondary | ICD-10-CM | POA: Diagnosis not present

## 2017-01-09 DIAGNOSIS — Z79899 Other long term (current) drug therapy: Secondary | ICD-10-CM | POA: Diagnosis not present

## 2017-01-09 DIAGNOSIS — Z6822 Body mass index (BMI) 22.0-22.9, adult: Secondary | ICD-10-CM | POA: Diagnosis not present

## 2017-01-09 DIAGNOSIS — R5383 Other fatigue: Secondary | ICD-10-CM | POA: Diagnosis not present

## 2017-02-13 DIAGNOSIS — Z791 Long term (current) use of non-steroidal anti-inflammatories (NSAID): Secondary | ICD-10-CM | POA: Diagnosis not present

## 2017-02-13 DIAGNOSIS — S12001D Unspecified nondisplaced fracture of first cervical vertebra, subsequent encounter for fracture with routine healing: Secondary | ICD-10-CM | POA: Diagnosis not present

## 2017-02-13 DIAGNOSIS — S12101D Unspecified nondisplaced fracture of second cervical vertebra, subsequent encounter for fracture with routine healing: Secondary | ICD-10-CM | POA: Diagnosis not present

## 2017-02-14 DIAGNOSIS — Z6821 Body mass index (BMI) 21.0-21.9, adult: Secondary | ICD-10-CM | POA: Diagnosis not present

## 2017-02-14 DIAGNOSIS — J029 Acute pharyngitis, unspecified: Secondary | ICD-10-CM | POA: Diagnosis not present

## 2017-02-19 DIAGNOSIS — S12101D Unspecified nondisplaced fracture of second cervical vertebra, subsequent encounter for fracture with routine healing: Secondary | ICD-10-CM | POA: Diagnosis not present

## 2017-02-19 DIAGNOSIS — S12001D Unspecified nondisplaced fracture of first cervical vertebra, subsequent encounter for fracture with routine healing: Secondary | ICD-10-CM | POA: Diagnosis not present

## 2017-02-19 DIAGNOSIS — R262 Difficulty in walking, not elsewhere classified: Secondary | ICD-10-CM | POA: Diagnosis not present

## 2017-02-19 DIAGNOSIS — H35351 Cystoid macular degeneration, right eye: Secondary | ICD-10-CM | POA: Diagnosis not present

## 2017-02-19 DIAGNOSIS — R531 Weakness: Secondary | ICD-10-CM | POA: Diagnosis not present

## 2017-02-21 DIAGNOSIS — S12001D Unspecified nondisplaced fracture of first cervical vertebra, subsequent encounter for fracture with routine healing: Secondary | ICD-10-CM | POA: Diagnosis not present

## 2017-02-21 DIAGNOSIS — R262 Difficulty in walking, not elsewhere classified: Secondary | ICD-10-CM | POA: Diagnosis not present

## 2017-02-21 DIAGNOSIS — R531 Weakness: Secondary | ICD-10-CM | POA: Diagnosis not present

## 2017-02-21 DIAGNOSIS — S12101D Unspecified nondisplaced fracture of second cervical vertebra, subsequent encounter for fracture with routine healing: Secondary | ICD-10-CM | POA: Diagnosis not present

## 2017-02-22 DIAGNOSIS — Z950 Presence of cardiac pacemaker: Secondary | ICD-10-CM | POA: Diagnosis not present

## 2017-02-27 DIAGNOSIS — S12101D Unspecified nondisplaced fracture of second cervical vertebra, subsequent encounter for fracture with routine healing: Secondary | ICD-10-CM | POA: Diagnosis not present

## 2017-02-27 DIAGNOSIS — S12001D Unspecified nondisplaced fracture of first cervical vertebra, subsequent encounter for fracture with routine healing: Secondary | ICD-10-CM | POA: Diagnosis not present

## 2017-02-27 DIAGNOSIS — R531 Weakness: Secondary | ICD-10-CM | POA: Diagnosis not present

## 2017-02-27 DIAGNOSIS — R262 Difficulty in walking, not elsewhere classified: Secondary | ICD-10-CM | POA: Diagnosis not present

## 2017-03-01 DIAGNOSIS — M1610 Unilateral primary osteoarthritis, unspecified hip: Secondary | ICD-10-CM | POA: Diagnosis not present

## 2017-03-01 DIAGNOSIS — Z6820 Body mass index (BMI) 20.0-20.9, adult: Secondary | ICD-10-CM | POA: Diagnosis not present

## 2017-03-02 DIAGNOSIS — S12101D Unspecified nondisplaced fracture of second cervical vertebra, subsequent encounter for fracture with routine healing: Secondary | ICD-10-CM | POA: Diagnosis not present

## 2017-03-02 DIAGNOSIS — R531 Weakness: Secondary | ICD-10-CM | POA: Diagnosis not present

## 2017-03-02 DIAGNOSIS — S12001D Unspecified nondisplaced fracture of first cervical vertebra, subsequent encounter for fracture with routine healing: Secondary | ICD-10-CM | POA: Diagnosis not present

## 2017-03-02 DIAGNOSIS — R262 Difficulty in walking, not elsewhere classified: Secondary | ICD-10-CM | POA: Diagnosis not present

## 2017-03-06 DIAGNOSIS — R262 Difficulty in walking, not elsewhere classified: Secondary | ICD-10-CM | POA: Diagnosis not present

## 2017-03-06 DIAGNOSIS — R531 Weakness: Secondary | ICD-10-CM | POA: Diagnosis not present

## 2017-03-06 DIAGNOSIS — S12001D Unspecified nondisplaced fracture of first cervical vertebra, subsequent encounter for fracture with routine healing: Secondary | ICD-10-CM | POA: Diagnosis not present

## 2017-03-06 DIAGNOSIS — S12101D Unspecified nondisplaced fracture of second cervical vertebra, subsequent encounter for fracture with routine healing: Secondary | ICD-10-CM | POA: Diagnosis not present

## 2017-03-08 DIAGNOSIS — R531 Weakness: Secondary | ICD-10-CM | POA: Diagnosis not present

## 2017-03-08 DIAGNOSIS — R262 Difficulty in walking, not elsewhere classified: Secondary | ICD-10-CM | POA: Diagnosis not present

## 2017-03-08 DIAGNOSIS — S12001D Unspecified nondisplaced fracture of first cervical vertebra, subsequent encounter for fracture with routine healing: Secondary | ICD-10-CM | POA: Diagnosis not present

## 2017-03-08 DIAGNOSIS — S12101D Unspecified nondisplaced fracture of second cervical vertebra, subsequent encounter for fracture with routine healing: Secondary | ICD-10-CM | POA: Diagnosis not present

## 2017-03-12 DIAGNOSIS — S12101D Unspecified nondisplaced fracture of second cervical vertebra, subsequent encounter for fracture with routine healing: Secondary | ICD-10-CM | POA: Diagnosis not present

## 2017-03-12 DIAGNOSIS — R262 Difficulty in walking, not elsewhere classified: Secondary | ICD-10-CM | POA: Diagnosis not present

## 2017-03-12 DIAGNOSIS — R531 Weakness: Secondary | ICD-10-CM | POA: Diagnosis not present

## 2017-03-12 DIAGNOSIS — S12001D Unspecified nondisplaced fracture of first cervical vertebra, subsequent encounter for fracture with routine healing: Secondary | ICD-10-CM | POA: Diagnosis not present

## 2017-03-14 DIAGNOSIS — J069 Acute upper respiratory infection, unspecified: Secondary | ICD-10-CM | POA: Diagnosis not present

## 2017-03-14 DIAGNOSIS — R531 Weakness: Secondary | ICD-10-CM | POA: Diagnosis not present

## 2017-03-14 DIAGNOSIS — S12001D Unspecified nondisplaced fracture of first cervical vertebra, subsequent encounter for fracture with routine healing: Secondary | ICD-10-CM | POA: Diagnosis not present

## 2017-03-14 DIAGNOSIS — Z6821 Body mass index (BMI) 21.0-21.9, adult: Secondary | ICD-10-CM | POA: Diagnosis not present

## 2017-03-14 DIAGNOSIS — S12101D Unspecified nondisplaced fracture of second cervical vertebra, subsequent encounter for fracture with routine healing: Secondary | ICD-10-CM | POA: Diagnosis not present

## 2017-03-14 DIAGNOSIS — R262 Difficulty in walking, not elsewhere classified: Secondary | ICD-10-CM | POA: Diagnosis not present

## 2017-03-19 DIAGNOSIS — R531 Weakness: Secondary | ICD-10-CM | POA: Diagnosis not present

## 2017-03-19 DIAGNOSIS — S12101D Unspecified nondisplaced fracture of second cervical vertebra, subsequent encounter for fracture with routine healing: Secondary | ICD-10-CM | POA: Diagnosis not present

## 2017-03-19 DIAGNOSIS — S12001D Unspecified nondisplaced fracture of first cervical vertebra, subsequent encounter for fracture with routine healing: Secondary | ICD-10-CM | POA: Diagnosis not present

## 2017-03-19 DIAGNOSIS — R262 Difficulty in walking, not elsewhere classified: Secondary | ICD-10-CM | POA: Diagnosis not present

## 2017-03-21 DIAGNOSIS — S12101D Unspecified nondisplaced fracture of second cervical vertebra, subsequent encounter for fracture with routine healing: Secondary | ICD-10-CM | POA: Diagnosis not present

## 2017-03-21 DIAGNOSIS — R262 Difficulty in walking, not elsewhere classified: Secondary | ICD-10-CM | POA: Diagnosis not present

## 2017-03-21 DIAGNOSIS — S12001D Unspecified nondisplaced fracture of first cervical vertebra, subsequent encounter for fracture with routine healing: Secondary | ICD-10-CM | POA: Diagnosis not present

## 2017-03-21 DIAGNOSIS — R531 Weakness: Secondary | ICD-10-CM | POA: Diagnosis not present

## 2017-03-29 DIAGNOSIS — L820 Inflamed seborrheic keratosis: Secondary | ICD-10-CM | POA: Diagnosis not present

## 2017-04-05 DIAGNOSIS — J329 Chronic sinusitis, unspecified: Secondary | ICD-10-CM | POA: Diagnosis not present

## 2017-04-05 DIAGNOSIS — L821 Other seborrheic keratosis: Secondary | ICD-10-CM | POA: Diagnosis not present

## 2017-04-05 DIAGNOSIS — Z6821 Body mass index (BMI) 21.0-21.9, adult: Secondary | ICD-10-CM | POA: Diagnosis not present

## 2017-04-09 DIAGNOSIS — J029 Acute pharyngitis, unspecified: Secondary | ICD-10-CM | POA: Diagnosis not present

## 2017-04-09 DIAGNOSIS — R490 Dysphonia: Secondary | ICD-10-CM | POA: Diagnosis not present

## 2017-04-10 DIAGNOSIS — E7800 Pure hypercholesterolemia, unspecified: Secondary | ICD-10-CM | POA: Diagnosis not present

## 2017-04-10 DIAGNOSIS — Z79899 Other long term (current) drug therapy: Secondary | ICD-10-CM | POA: Diagnosis not present

## 2017-04-10 DIAGNOSIS — E559 Vitamin D deficiency, unspecified: Secondary | ICD-10-CM | POA: Diagnosis not present

## 2017-04-10 DIAGNOSIS — M1610 Unilateral primary osteoarthritis, unspecified hip: Secondary | ICD-10-CM | POA: Diagnosis not present

## 2017-04-10 DIAGNOSIS — Z6821 Body mass index (BMI) 21.0-21.9, adult: Secondary | ICD-10-CM | POA: Diagnosis not present

## 2017-04-10 DIAGNOSIS — N289 Disorder of kidney and ureter, unspecified: Secondary | ICD-10-CM | POA: Diagnosis not present

## 2017-04-10 DIAGNOSIS — K219 Gastro-esophageal reflux disease without esophagitis: Secondary | ICD-10-CM | POA: Diagnosis not present

## 2017-04-10 DIAGNOSIS — M159 Polyosteoarthritis, unspecified: Secondary | ICD-10-CM | POA: Diagnosis not present

## 2017-04-10 DIAGNOSIS — I495 Sick sinus syndrome: Secondary | ICD-10-CM | POA: Diagnosis not present

## 2017-04-10 DIAGNOSIS — I4891 Unspecified atrial fibrillation: Secondary | ICD-10-CM | POA: Diagnosis not present

## 2017-04-13 DIAGNOSIS — J029 Acute pharyngitis, unspecified: Secondary | ICD-10-CM | POA: Diagnosis not present

## 2017-04-17 DIAGNOSIS — I7090 Unspecified atherosclerosis: Secondary | ICD-10-CM | POA: Diagnosis not present

## 2017-04-17 DIAGNOSIS — J029 Acute pharyngitis, unspecified: Secondary | ICD-10-CM | POA: Diagnosis not present

## 2017-04-17 DIAGNOSIS — S129XXA Fracture of neck, unspecified, initial encounter: Secondary | ICD-10-CM | POA: Diagnosis not present

## 2017-04-17 DIAGNOSIS — J984 Other disorders of lung: Secondary | ICD-10-CM | POA: Diagnosis not present

## 2017-04-17 DIAGNOSIS — K3189 Other diseases of stomach and duodenum: Secondary | ICD-10-CM | POA: Diagnosis not present

## 2017-05-15 DIAGNOSIS — H26493 Other secondary cataract, bilateral: Secondary | ICD-10-CM | POA: Diagnosis not present

## 2017-05-15 DIAGNOSIS — H353131 Nonexudative age-related macular degeneration, bilateral, early dry stage: Secondary | ICD-10-CM | POA: Diagnosis not present

## 2017-05-22 DIAGNOSIS — K219 Gastro-esophageal reflux disease without esophagitis: Secondary | ICD-10-CM | POA: Diagnosis not present

## 2017-05-30 DIAGNOSIS — J029 Acute pharyngitis, unspecified: Secondary | ICD-10-CM | POA: Diagnosis not present

## 2017-06-06 DIAGNOSIS — H6092 Unspecified otitis externa, left ear: Secondary | ICD-10-CM | POA: Diagnosis not present

## 2017-06-14 DIAGNOSIS — M810 Age-related osteoporosis without current pathological fracture: Secondary | ICD-10-CM | POA: Diagnosis not present

## 2017-07-02 DIAGNOSIS — S12001D Unspecified nondisplaced fracture of first cervical vertebra, subsequent encounter for fracture with routine healing: Secondary | ICD-10-CM | POA: Diagnosis not present

## 2017-07-02 DIAGNOSIS — M5030 Other cervical disc degeneration, unspecified cervical region: Secondary | ICD-10-CM | POA: Diagnosis not present

## 2017-07-02 DIAGNOSIS — S12090D Other displaced fracture of first cervical vertebra, subsequent encounter for fracture with routine healing: Secondary | ICD-10-CM | POA: Diagnosis not present

## 2017-07-02 DIAGNOSIS — Z885 Allergy status to narcotic agent status: Secondary | ICD-10-CM | POA: Diagnosis not present

## 2017-07-02 DIAGNOSIS — M8580 Other specified disorders of bone density and structure, unspecified site: Secondary | ICD-10-CM | POA: Diagnosis not present

## 2017-07-02 DIAGNOSIS — S12100D Unspecified displaced fracture of second cervical vertebra, subsequent encounter for fracture with routine healing: Secondary | ICD-10-CM | POA: Diagnosis not present

## 2017-07-02 DIAGNOSIS — M1288 Other specific arthropathies, not elsewhere classified, other specified site: Secondary | ICD-10-CM | POA: Diagnosis not present

## 2017-07-02 DIAGNOSIS — I7090 Unspecified atherosclerosis: Secondary | ICD-10-CM | POA: Diagnosis not present

## 2017-07-02 DIAGNOSIS — M4183 Other forms of scoliosis, cervicothoracic region: Secondary | ICD-10-CM | POA: Diagnosis not present

## 2017-07-02 DIAGNOSIS — Z950 Presence of cardiac pacemaker: Secondary | ICD-10-CM | POA: Diagnosis not present

## 2017-07-02 DIAGNOSIS — M4692 Unspecified inflammatory spondylopathy, cervical region: Secondary | ICD-10-CM | POA: Diagnosis not present

## 2017-07-02 DIAGNOSIS — S12101D Unspecified nondisplaced fracture of second cervical vertebra, subsequent encounter for fracture with routine healing: Secondary | ICD-10-CM | POA: Diagnosis not present

## 2017-07-05 DIAGNOSIS — J309 Allergic rhinitis, unspecified: Secondary | ICD-10-CM | POA: Diagnosis not present

## 2017-07-05 DIAGNOSIS — Z6821 Body mass index (BMI) 21.0-21.9, adult: Secondary | ICD-10-CM | POA: Diagnosis not present

## 2017-07-10 DIAGNOSIS — M1712 Unilateral primary osteoarthritis, left knee: Secondary | ICD-10-CM | POA: Diagnosis not present

## 2017-07-18 DIAGNOSIS — Z79899 Other long term (current) drug therapy: Secondary | ICD-10-CM | POA: Diagnosis not present

## 2017-07-18 DIAGNOSIS — R05 Cough: Secondary | ICD-10-CM | POA: Diagnosis not present

## 2017-07-18 DIAGNOSIS — Z6820 Body mass index (BMI) 20.0-20.9, adult: Secondary | ICD-10-CM | POA: Diagnosis not present

## 2017-07-18 DIAGNOSIS — J309 Allergic rhinitis, unspecified: Secondary | ICD-10-CM | POA: Diagnosis not present

## 2017-08-21 DIAGNOSIS — R001 Bradycardia, unspecified: Secondary | ICD-10-CM | POA: Diagnosis not present

## 2017-08-21 DIAGNOSIS — Z950 Presence of cardiac pacemaker: Secondary | ICD-10-CM | POA: Diagnosis not present

## 2017-08-21 DIAGNOSIS — I480 Paroxysmal atrial fibrillation: Secondary | ICD-10-CM | POA: Insufficient documentation

## 2017-08-21 DIAGNOSIS — R0789 Other chest pain: Secondary | ICD-10-CM | POA: Diagnosis not present

## 2017-08-21 HISTORY — DX: Paroxysmal atrial fibrillation: I48.0

## 2017-08-27 DIAGNOSIS — R0789 Other chest pain: Secondary | ICD-10-CM | POA: Diagnosis not present

## 2017-08-27 DIAGNOSIS — Z6821 Body mass index (BMI) 21.0-21.9, adult: Secondary | ICD-10-CM | POA: Diagnosis not present

## 2017-08-27 DIAGNOSIS — Z79899 Other long term (current) drug therapy: Secondary | ICD-10-CM | POA: Diagnosis not present

## 2017-08-29 ENCOUNTER — Ambulatory Visit: Payer: Medicare Other | Admitting: Cardiology

## 2017-09-04 DIAGNOSIS — R0789 Other chest pain: Secondary | ICD-10-CM | POA: Diagnosis not present

## 2017-09-12 DIAGNOSIS — I495 Sick sinus syndrome: Secondary | ICD-10-CM | POA: Diagnosis not present

## 2017-09-12 DIAGNOSIS — K219 Gastro-esophageal reflux disease without esophagitis: Secondary | ICD-10-CM | POA: Diagnosis not present

## 2017-09-12 DIAGNOSIS — E559 Vitamin D deficiency, unspecified: Secondary | ICD-10-CM | POA: Diagnosis not present

## 2017-09-12 DIAGNOSIS — M159 Polyosteoarthritis, unspecified: Secondary | ICD-10-CM | POA: Diagnosis not present

## 2017-09-12 DIAGNOSIS — I4891 Unspecified atrial fibrillation: Secondary | ICD-10-CM | POA: Diagnosis not present

## 2017-09-12 DIAGNOSIS — L299 Pruritus, unspecified: Secondary | ICD-10-CM | POA: Diagnosis not present

## 2017-09-12 DIAGNOSIS — Z79899 Other long term (current) drug therapy: Secondary | ICD-10-CM | POA: Diagnosis not present

## 2017-09-12 DIAGNOSIS — E7800 Pure hypercholesterolemia, unspecified: Secondary | ICD-10-CM | POA: Diagnosis not present

## 2017-09-12 DIAGNOSIS — Z6821 Body mass index (BMI) 21.0-21.9, adult: Secondary | ICD-10-CM | POA: Diagnosis not present

## 2017-09-12 DIAGNOSIS — N289 Disorder of kidney and ureter, unspecified: Secondary | ICD-10-CM | POA: Diagnosis not present

## 2017-09-20 DIAGNOSIS — L299 Pruritus, unspecified: Secondary | ICD-10-CM | POA: Diagnosis not present

## 2017-09-20 DIAGNOSIS — L853 Xerosis cutis: Secondary | ICD-10-CM | POA: Diagnosis not present

## 2017-09-20 DIAGNOSIS — L300 Nummular dermatitis: Secondary | ICD-10-CM | POA: Diagnosis not present

## 2017-09-26 DIAGNOSIS — S93402A Sprain of unspecified ligament of left ankle, initial encounter: Secondary | ICD-10-CM | POA: Diagnosis not present

## 2017-09-26 DIAGNOSIS — S99912A Unspecified injury of left ankle, initial encounter: Secondary | ICD-10-CM | POA: Diagnosis not present

## 2017-09-26 DIAGNOSIS — S3993XA Unspecified injury of pelvis, initial encounter: Secondary | ICD-10-CM | POA: Diagnosis not present

## 2017-09-26 DIAGNOSIS — M7989 Other specified soft tissue disorders: Secondary | ICD-10-CM | POA: Diagnosis not present

## 2017-10-03 DIAGNOSIS — R0789 Other chest pain: Secondary | ICD-10-CM | POA: Diagnosis not present

## 2017-10-03 DIAGNOSIS — I480 Paroxysmal atrial fibrillation: Secondary | ICD-10-CM | POA: Diagnosis not present

## 2017-10-03 DIAGNOSIS — R001 Bradycardia, unspecified: Secondary | ICD-10-CM | POA: Diagnosis not present

## 2017-10-03 DIAGNOSIS — Z950 Presence of cardiac pacemaker: Secondary | ICD-10-CM | POA: Diagnosis not present

## 2017-10-04 DIAGNOSIS — H70001 Acute mastoiditis without complications, right ear: Secondary | ICD-10-CM | POA: Diagnosis not present

## 2017-10-04 DIAGNOSIS — Z6821 Body mass index (BMI) 21.0-21.9, adult: Secondary | ICD-10-CM | POA: Diagnosis not present

## 2017-10-04 DIAGNOSIS — Z79899 Other long term (current) drug therapy: Secondary | ICD-10-CM | POA: Diagnosis not present

## 2017-10-14 DIAGNOSIS — S34139A Unspecified injury to sacral spinal cord, initial encounter: Secondary | ICD-10-CM | POA: Diagnosis not present

## 2017-10-14 DIAGNOSIS — S299XXA Unspecified injury of thorax, initial encounter: Secondary | ICD-10-CM | POA: Diagnosis not present

## 2017-10-14 DIAGNOSIS — R0781 Pleurodynia: Secondary | ICD-10-CM | POA: Diagnosis not present

## 2017-10-14 DIAGNOSIS — S3993XA Unspecified injury of pelvis, initial encounter: Secondary | ICD-10-CM | POA: Diagnosis not present

## 2017-10-14 DIAGNOSIS — S3992XA Unspecified injury of lower back, initial encounter: Secondary | ICD-10-CM | POA: Diagnosis not present

## 2017-10-14 DIAGNOSIS — R102 Pelvic and perineal pain: Secondary | ICD-10-CM | POA: Diagnosis not present

## 2017-10-14 DIAGNOSIS — M545 Low back pain: Secondary | ICD-10-CM | POA: Diagnosis not present

## 2017-10-14 DIAGNOSIS — M546 Pain in thoracic spine: Secondary | ICD-10-CM | POA: Diagnosis not present

## 2017-10-18 DIAGNOSIS — M159 Polyosteoarthritis, unspecified: Secondary | ICD-10-CM | POA: Diagnosis not present

## 2017-10-18 DIAGNOSIS — Z6820 Body mass index (BMI) 20.0-20.9, adult: Secondary | ICD-10-CM | POA: Diagnosis not present

## 2017-10-26 DIAGNOSIS — Z23 Encounter for immunization: Secondary | ICD-10-CM | POA: Diagnosis not present

## 2017-11-02 DIAGNOSIS — Z0000 Encounter for general adult medical examination without abnormal findings: Secondary | ICD-10-CM | POA: Diagnosis not present

## 2017-11-02 DIAGNOSIS — E785 Hyperlipidemia, unspecified: Secondary | ICD-10-CM | POA: Diagnosis not present

## 2017-11-02 DIAGNOSIS — Z1389 Encounter for screening for other disorder: Secondary | ICD-10-CM | POA: Diagnosis not present

## 2017-11-02 DIAGNOSIS — Z1331 Encounter for screening for depression: Secondary | ICD-10-CM | POA: Diagnosis not present

## 2017-11-02 DIAGNOSIS — Z9181 History of falling: Secondary | ICD-10-CM | POA: Diagnosis not present

## 2017-11-13 DIAGNOSIS — K219 Gastro-esophageal reflux disease without esophagitis: Secondary | ICD-10-CM | POA: Diagnosis not present

## 2017-11-19 DIAGNOSIS — L300 Nummular dermatitis: Secondary | ICD-10-CM | POA: Diagnosis not present

## 2017-11-19 DIAGNOSIS — L299 Pruritus, unspecified: Secondary | ICD-10-CM | POA: Diagnosis not present

## 2017-11-27 DIAGNOSIS — M1712 Unilateral primary osteoarthritis, left knee: Secondary | ICD-10-CM | POA: Diagnosis not present

## 2017-11-27 DIAGNOSIS — H353131 Nonexudative age-related macular degeneration, bilateral, early dry stage: Secondary | ICD-10-CM | POA: Diagnosis not present

## 2017-11-27 DIAGNOSIS — H26493 Other secondary cataract, bilateral: Secondary | ICD-10-CM | POA: Diagnosis not present

## 2017-12-11 DIAGNOSIS — R109 Unspecified abdominal pain: Secondary | ICD-10-CM | POA: Diagnosis not present

## 2017-12-11 DIAGNOSIS — Z6821 Body mass index (BMI) 21.0-21.9, adult: Secondary | ICD-10-CM | POA: Diagnosis not present

## 2017-12-24 DIAGNOSIS — E46 Unspecified protein-calorie malnutrition: Secondary | ICD-10-CM | POA: Diagnosis not present

## 2017-12-24 DIAGNOSIS — K5909 Other constipation: Secondary | ICD-10-CM | POA: Diagnosis not present

## 2017-12-24 DIAGNOSIS — Z79899 Other long term (current) drug therapy: Secondary | ICD-10-CM | POA: Diagnosis not present

## 2017-12-24 DIAGNOSIS — Z6820 Body mass index (BMI) 20.0-20.9, adult: Secondary | ICD-10-CM | POA: Diagnosis not present

## 2017-12-24 DIAGNOSIS — I998 Other disorder of circulatory system: Secondary | ICD-10-CM | POA: Diagnosis not present

## 2017-12-24 DIAGNOSIS — E538 Deficiency of other specified B group vitamins: Secondary | ICD-10-CM | POA: Diagnosis not present

## 2017-12-24 DIAGNOSIS — R531 Weakness: Secondary | ICD-10-CM | POA: Diagnosis not present

## 2018-01-01 ENCOUNTER — Ambulatory Visit: Payer: Medicare Other | Admitting: Physician Assistant

## 2018-02-11 DIAGNOSIS — Z6821 Body mass index (BMI) 21.0-21.9, adult: Secondary | ICD-10-CM | POA: Diagnosis not present

## 2018-02-11 DIAGNOSIS — Z79899 Other long term (current) drug therapy: Secondary | ICD-10-CM | POA: Diagnosis not present

## 2018-02-11 DIAGNOSIS — R112 Nausea with vomiting, unspecified: Secondary | ICD-10-CM | POA: Diagnosis not present

## 2018-02-11 DIAGNOSIS — I4891 Unspecified atrial fibrillation: Secondary | ICD-10-CM | POA: Diagnosis not present

## 2018-02-11 DIAGNOSIS — Z950 Presence of cardiac pacemaker: Secondary | ICD-10-CM | POA: Diagnosis not present

## 2018-02-11 DIAGNOSIS — M25471 Effusion, right ankle: Secondary | ICD-10-CM | POA: Diagnosis not present

## 2018-02-11 DIAGNOSIS — R55 Syncope and collapse: Secondary | ICD-10-CM | POA: Diagnosis not present

## 2018-02-11 DIAGNOSIS — Z7982 Long term (current) use of aspirin: Secondary | ICD-10-CM | POA: Diagnosis not present

## 2018-02-11 DIAGNOSIS — R079 Chest pain, unspecified: Secondary | ICD-10-CM | POA: Diagnosis not present

## 2018-02-11 DIAGNOSIS — N649 Disorder of breast, unspecified: Secondary | ICD-10-CM | POA: Diagnosis not present

## 2018-02-11 DIAGNOSIS — R42 Dizziness and giddiness: Secondary | ICD-10-CM | POA: Diagnosis not present

## 2018-02-12 DIAGNOSIS — L820 Inflamed seborrheic keratosis: Secondary | ICD-10-CM | POA: Diagnosis not present

## 2018-02-15 DIAGNOSIS — Z6820 Body mass index (BMI) 20.0-20.9, adult: Secondary | ICD-10-CM | POA: Diagnosis not present

## 2018-02-15 DIAGNOSIS — J029 Acute pharyngitis, unspecified: Secondary | ICD-10-CM | POA: Diagnosis not present

## 2018-02-21 DIAGNOSIS — Z45018 Encounter for adjustment and management of other part of cardiac pacemaker: Secondary | ICD-10-CM | POA: Diagnosis not present

## 2018-02-21 DIAGNOSIS — R0982 Postnasal drip: Secondary | ICD-10-CM

## 2018-02-21 DIAGNOSIS — R001 Bradycardia, unspecified: Secondary | ICD-10-CM | POA: Diagnosis not present

## 2018-02-21 DIAGNOSIS — Z950 Presence of cardiac pacemaker: Secondary | ICD-10-CM | POA: Diagnosis not present

## 2018-02-21 HISTORY — DX: Postnasal drip: R09.82

## 2018-02-26 DIAGNOSIS — S1096XA Insect bite of unspecified part of neck, initial encounter: Secondary | ICD-10-CM | POA: Diagnosis not present

## 2018-02-26 DIAGNOSIS — Z6821 Body mass index (BMI) 21.0-21.9, adult: Secondary | ICD-10-CM | POA: Diagnosis not present

## 2018-03-13 DIAGNOSIS — Z79899 Other long term (current) drug therapy: Secondary | ICD-10-CM | POA: Diagnosis not present

## 2018-03-13 DIAGNOSIS — E538 Deficiency of other specified B group vitamins: Secondary | ICD-10-CM | POA: Diagnosis not present

## 2018-03-13 DIAGNOSIS — Z6821 Body mass index (BMI) 21.0-21.9, adult: Secondary | ICD-10-CM | POA: Diagnosis not present

## 2018-03-13 DIAGNOSIS — R531 Weakness: Secondary | ICD-10-CM | POA: Diagnosis not present

## 2018-03-13 DIAGNOSIS — E46 Unspecified protein-calorie malnutrition: Secondary | ICD-10-CM | POA: Diagnosis not present

## 2018-03-26 DIAGNOSIS — K219 Gastro-esophageal reflux disease without esophagitis: Secondary | ICD-10-CM | POA: Diagnosis not present

## 2018-03-26 DIAGNOSIS — E7800 Pure hypercholesterolemia, unspecified: Secondary | ICD-10-CM | POA: Diagnosis not present

## 2018-03-26 DIAGNOSIS — Z79899 Other long term (current) drug therapy: Secondary | ICD-10-CM | POA: Diagnosis not present

## 2018-03-26 DIAGNOSIS — I4891 Unspecified atrial fibrillation: Secondary | ICD-10-CM | POA: Diagnosis not present

## 2018-03-26 DIAGNOSIS — E559 Vitamin D deficiency, unspecified: Secondary | ICD-10-CM | POA: Diagnosis not present

## 2018-03-26 DIAGNOSIS — Z6820 Body mass index (BMI) 20.0-20.9, adult: Secondary | ICD-10-CM | POA: Diagnosis not present

## 2018-03-26 DIAGNOSIS — M159 Polyosteoarthritis, unspecified: Secondary | ICD-10-CM | POA: Diagnosis not present

## 2018-03-26 DIAGNOSIS — N289 Disorder of kidney and ureter, unspecified: Secondary | ICD-10-CM | POA: Diagnosis not present

## 2018-03-26 DIAGNOSIS — I495 Sick sinus syndrome: Secondary | ICD-10-CM | POA: Diagnosis not present

## 2018-03-28 DIAGNOSIS — L300 Nummular dermatitis: Secondary | ICD-10-CM | POA: Diagnosis not present

## 2018-04-11 ENCOUNTER — Encounter: Payer: Self-pay | Admitting: Cardiology

## 2018-04-11 ENCOUNTER — Ambulatory Visit (INDEPENDENT_AMBULATORY_CARE_PROVIDER_SITE_OTHER): Payer: Medicare HMO | Admitting: Cardiology

## 2018-04-11 VITALS — BP 108/62 | HR 76 | Ht 64.0 in | Wt 94.0 lb

## 2018-04-11 DIAGNOSIS — Z950 Presence of cardiac pacemaker: Secondary | ICD-10-CM

## 2018-04-11 DIAGNOSIS — R001 Bradycardia, unspecified: Secondary | ICD-10-CM | POA: Diagnosis not present

## 2018-04-11 DIAGNOSIS — I480 Paroxysmal atrial fibrillation: Secondary | ICD-10-CM

## 2018-04-11 MED ORDER — SOTALOL HCL 80 MG PO TABS: 40.0000 mg | ORAL_TABLET | Freq: Two times a day (BID) | ORAL | 1 refills | Status: DC

## 2018-04-11 NOTE — Patient Instructions (Signed)
Medication Instructions:  Your physician recommends that you continue on your current medications as directed. Please refer to the Current Medication list given to you today.  Refill for sotalol sent  Labwork: None  Testing/Procedures: None  Follow-Up: Your physician recommends that you schedule a follow-up appointment in: 5 months  Any Other Special Instructions Will Be Listed Below (If Applicable).     If you need a refill on your cardiac medications before your next appointment, please call your pharmacy.   [facility]  Garey HamAshley A, RN, BSN

## 2018-04-11 NOTE — Progress Notes (Signed)
Cardiology Office Note:    Date:  04/11/2018   ID:  Heather Thomas, DOB March 07, 1927, MRN [id number]  PCP:  Paulina Fusi, MD  Cardiologist:  Gypsy Balsam, MD    Referring MD: Paulina Fusi, MD   No chief complaint on file. I am doing well  History of Present Illness:    Heather Thomas is a 84 y.o. female with paroxysmal atrial fibrillation, bradycardia, status post pacemaker implantation comes to the office for follow-up overall seems to be doing well no chest pain tightness squeezing pressure burning chest.  She did have some atypical chest pain apparently stress test was done which was negative but will get a copy of it from our previous office.  Past Medical History:  Diagnosis Date  . A-fib (HCC)   . Acid reflux     Past Surgical History:  Procedure Laterality Date  . ABDOMINAL HYSTERECTOMY    . INSERT / REPLACE / REMOVE PACEMAKER     [employer]  . LEG SURGERY      Current Medications: Current Meds  Medication Sig  . aspirin EC 325 MG tablet Take 325 mg by mouth daily.  . sotalol (BETAPACE) 80 MG tablet Take 40 mg by mouth 2 (two) times daily.     Allergies:   Codeine   Social History   Socioeconomic History  . Marital status: Married    Spouse name: Not on file  . Number of children: Not on file  . Years of education: Not on file  . Highest education level: Not on file  Occupational History  . Not on file  Social Needs  . Financial resource strain: Not on file  . Food insecurity:    Worry: Not on file    Inability: Not on file  . Transportation needs:    Medical: Not on file    Non-medical: Not on file  Tobacco Use  . Smoking status: Never Smoker  . Smokeless tobacco: Never Used  Substance and Sexual Activity  . Alcohol use: No    Frequency: Never  . Drug use: No  . Sexual activity: Not on file  Lifestyle  . Physical activity:    Days per week: Not on file    Minutes per session: Not on file  . Stress: Not on file  Relationships  .  Social connections:    Talks on phone: Not on file    Gets together: Not on file    Attends religious service: Not on file    Active member of club or organization: Not on file    Attends meetings of clubs or organizations: Not on file    Relationship status: Not on file  Other Topics Concern  . Not on file  Social History Narrative  . Not on file     Family History: The patient's family history includes Cervical cancer in her daughter; Diabetes in her daughter; Heart attack in her mother; Lung disease in her son. ROS:   Please see the history of present illness.    All 14 point review of systems negative except as described per history of present illness  EKGs/Labs/Other Studies Reviewed:      Recent Labs: No results found for requested labs within last 8760 hours.  Recent Lipid Panel No results found for: CHOL, TRIG, HDL, CHOLHDL, VLDL, LDLCALC, LDLDIRECT  Physical Exam:    VS:  BP 108/62 (BP Location: Right Arm, Patient Position: Sitting, Cuff Size: Normal)   Pulse 76   Ht  5\' 4"  (1.626 m)   Wt 94 lb (42.6 kg)   SpO2 97%   BMI 16.14 kg/m     Wt Readings from Last 3 Encounters:  04/11/18 94 lb (42.6 kg)     GEN:  Well nourished, well developed in no acute distress HEENT: Normal NECK: No JVD; No carotid bruits LYMPHATICS: No lymphadenopathy CARDIAC: RRR, no murmurs, no rubs, no gallops RESPIRATORY:  Clear to auscultation without rales, wheezing or rhonchi  ABDOMEN: Soft, non-tender, non-distended MUSCULOSKELETAL:  No edema; No deformity  SKIN: Warm and dry LOWER EXTREMITIES: no swelling NEUROLOGIC:  Alert and oriented x 3 PSYCHIATRIC:  Normal affect   ASSESSMENT:    1. PAF (paroxysmal atrial fibrillation) (HCC)   2. Bradycardia   3. Pacemaker    PLAN:    In order of problems listed above:  1. Paroxysmal atrial fibrillation not anticoagulated because of fragile state and frequent falls.  We will continue present management which include sotalol.   Sotalol will be renewed for EKG she was normal her QT interval is 430 2. Bradycardia.  This being addressed with pacemaker implantation pacemaker is functioning properly I reviewed interrogation from May.  Good battery status normal function. 3. Overall she is doing well I see her back in my office in about 4 5 months sooner if she get a problem 4.    Medication Adjustments/Labs and Tests Ordered: Current medicines are reviewed at length with the patient today.  Concerns regarding medicines are outlined above.  No orders of the defined types were placed in this encounter.  Medication changes: No orders of the defined types were placed in this encounter.   Signed, Georgeanna Leaobert J. Krasowski, MD, [facility] HospitalFACC 04/11/2018 3:35 PM    [facility]

## 2018-05-17 DIAGNOSIS — Z6821 Body mass index (BMI) 21.0-21.9, adult: Secondary | ICD-10-CM | POA: Diagnosis not present

## 2018-05-17 DIAGNOSIS — M25559 Pain in unspecified hip: Secondary | ICD-10-CM | POA: Diagnosis not present

## 2018-06-13 DIAGNOSIS — K219 Gastro-esophageal reflux disease without esophagitis: Secondary | ICD-10-CM | POA: Diagnosis not present

## 2018-06-13 DIAGNOSIS — E559 Vitamin D deficiency, unspecified: Secondary | ICD-10-CM | POA: Diagnosis not present

## 2018-06-13 DIAGNOSIS — I495 Sick sinus syndrome: Secondary | ICD-10-CM | POA: Diagnosis not present

## 2018-06-13 DIAGNOSIS — N289 Disorder of kidney and ureter, unspecified: Secondary | ICD-10-CM | POA: Diagnosis not present

## 2018-06-13 DIAGNOSIS — I4891 Unspecified atrial fibrillation: Secondary | ICD-10-CM | POA: Diagnosis not present

## 2018-06-13 DIAGNOSIS — E7800 Pure hypercholesterolemia, unspecified: Secondary | ICD-10-CM | POA: Diagnosis not present

## 2018-06-13 DIAGNOSIS — M159 Polyosteoarthritis, unspecified: Secondary | ICD-10-CM | POA: Diagnosis not present

## 2018-06-13 DIAGNOSIS — M7661 Achilles tendinitis, right leg: Secondary | ICD-10-CM | POA: Diagnosis not present

## 2018-06-13 DIAGNOSIS — Z79899 Other long term (current) drug therapy: Secondary | ICD-10-CM | POA: Diagnosis not present

## 2018-06-19 DIAGNOSIS — S12100D Unspecified displaced fracture of second cervical vertebra, subsequent encounter for fracture with routine healing: Secondary | ICD-10-CM | POA: Diagnosis not present

## 2018-06-19 DIAGNOSIS — S12001D Unspecified nondisplaced fracture of first cervical vertebra, subsequent encounter for fracture with routine healing: Secondary | ICD-10-CM | POA: Diagnosis not present

## 2018-06-19 DIAGNOSIS — S12000D Unspecified displaced fracture of first cervical vertebra, subsequent encounter for fracture with routine healing: Secondary | ICD-10-CM | POA: Diagnosis not present

## 2018-07-05 DIAGNOSIS — Z23 Encounter for immunization: Secondary | ICD-10-CM | POA: Diagnosis not present

## 2018-07-24 ENCOUNTER — Encounter: Payer: Self-pay | Admitting: Gastroenterology

## 2018-07-25 ENCOUNTER — Ambulatory Visit: Payer: Medicare HMO | Admitting: Gastroenterology

## 2018-07-25 ENCOUNTER — Encounter: Payer: Self-pay | Admitting: Gastroenterology

## 2018-07-25 VITALS — BP 120/62 | HR 70 | Ht <= 58 in | Wt 99.1 lb

## 2018-07-25 DIAGNOSIS — K581 Irritable bowel syndrome with constipation: Secondary | ICD-10-CM | POA: Diagnosis not present

## 2018-07-25 DIAGNOSIS — K5730 Diverticulosis of large intestine without perforation or abscess without bleeding: Secondary | ICD-10-CM | POA: Diagnosis not present

## 2018-07-25 MED ORDER — SACCHAROMYCES BOULARDII 250 MG PO CAPS: 250.0000 mg | ORAL_CAPSULE | Freq: Every day | ORAL | 3 refills | Status: DC

## 2018-07-25 NOTE — Progress Notes (Signed)
Chief Complaint: Lower abdominal pain  Referring Provider: Dr. Wyline Mood      ASSESSMENT AND PLAN;   #1.  IBS with constipation (had diarrhea after oatmeal/raison cookies)  #2.  Lower abdominal pain (better with defecation)  Plan: - Please obtain previous records of recent blood draw. (Dr Wyline Mood) - Probiotics 1 tablet p.o. once a day. - Benefiber half teaspoon p.o. once a day.  Increase water intake - Call in 2 weeks. - If not better, CT abdo/pelvis after labs are reviewed. - She does not want another colonoscopy.  I do agree. HPI:    Heather Thomas is a 83 y.o. female  For follow-up visit History of chronic constipation Had diarrhea after eating 3 reason oatmeal cookies Since then has been constipated Lower abdominal pain which gets better with defecation No melena or hematochezia Seen by Dr. Wyline Mood and had blood work done.  We do not have any records currently.  Had attempted colonoscopy 07/2003 up to 35 cm, severe sigmoid diverticulosis. Past Medical History:  Diagnosis Date  . A-fib (HCC)   . Acid reflux   . AF (paroxysmal atrial fibrillation) (HCC)   . Anemia   . DJD (degenerative joint disease)   . Osteoporosis   . PMR (polymyalgia rheumatica) (HCC)     Past Surgical History:  Procedure Laterality Date  . ABDOMINAL HYSTERECTOMY    . COLONOSCOPY  07/09/2003   Colonic sigmoid diverticulosis. Otherwise normal attempted colonoscopy to 35cm.   . ESOPHAGOGASTRODUODENOSCOPY  10/15/2014   Erosive gastritis. Otherwise normal EGD,   . INSERT / REPLACE / REMOVE PACEMAKER     [employer]  . LEG SURGERY      Family History  Problem Relation Age of Onset  . Diabetes Daughter   . Cervical cancer Daughter   . Heart attack Mother   . Lung disease Son   . Colon cancer Neg Hx   . Esophageal cancer Neg Hx     Social History   Tobacco Use  . Smoking status: Never Smoker  . Smokeless tobacco: Never Used  Substance Use Topics  . Alcohol  use: No    Frequency: Never  . Drug use: No    Current Outpatient Medications  Medication Sig Dispense Refill  . aspirin EC 325 MG tablet Take 325 mg by mouth as needed.     . fluticasone (FLONASE) 50 MCG/ACT nasal spray Place 1 spray into both nostrils as needed.     . meloxicam (MOBIC) 7.5 MG tablet as needed.    . sotalol (BETAPACE) 80 MG tablet Take 0.5 tablets (40 mg total) by mouth 2 (two) times daily. 180 tablet 1  . Vitamin D, Ergocalciferol, (DRISDOL) 50000 units CAPS capsule Take 1 capsule by mouth once a week.    . denosumab (PROLIA) 60 MG/ML SOLN injection Inject 60 mg into the skin every 6 (six) months.    . NEOMYCIN-POLYMYXIN-HYDROCORTISONE (CORTISPORIN) 1 % SOLN OTIC solution 1 drop.     No current facility-administered medications for this visit.     Allergies  Allergen Reactions  . Codeine Nausea Only    Review of Systems:  Negative except for HPI     Physical Exam:    BP 120/62   Pulse 70   Ht 4\' 10"  (1.473 m)   Wt 99 lb 2 oz (45 kg)   BMI 20.72 kg/m  Filed Weights   07/25/18 1502  Weight: 99 lb 2 oz (45 kg)   Constitutional:  Well-developed, in  no acute distress.  Frail female, uses walker Psychiatric: Normal mood and affect. Behavior is normal. HEENT: Pupils normal.  Conjunctivae are normal. No scleral icterus. Cardiovascular: Normal rate, regular rhythm. No edema Pulmonary/chest: Effort normal and breath sounds normal. No wheezing, rales or rhonchi. Abdominal: Soft, nondistended. Nontender. Bowel sounds active throughout. There are no masses palpable. No hepatomegaly. Rectal:  defered Neurological: Alert and oriented to person place and time. Has significant kyphosis. Skin: Skin is warm and dry. No rashes noted. I spent 15 minutes of face-to-face time with the patient. Greater than 50% of the time was spent counseling and coordinating care.    Edman Circle, MD 07/25/2018, 3:26 PM  Cc: Paulina Fusi, MD

## 2018-07-25 NOTE — Patient Instructions (Addendum)
If you are age 82 or older, your body mass index should be between 23-30. Your Body mass index is 20.72 kg/m. If this is out of the aforementioned range listed, please consider follow up with your Primary Care Provider.  If you are age 47 or younger, your body mass index should be between 19-25. Your Body mass index is 20.72 kg/m. If this is out of the aformentioned range listed, please consider follow up with your Primary Care Provider.   Please purchase the following medications over the counter and take as directed: Benefiber  Please call Dr. Donne Hazel nurse Selinda Michaels, RN)  in 2 weeks at [phone]  to let her now how you are doing.   We have sent the following medications to your pharmacy for you to pick up at your convenience: Florastor once daily.   Thank you,  Dr. Lynann Bologna

## 2018-08-14 DIAGNOSIS — Z79899 Other long term (current) drug therapy: Secondary | ICD-10-CM | POA: Diagnosis not present

## 2018-08-14 DIAGNOSIS — Z1339 Encounter for screening examination for other mental health and behavioral disorders: Secondary | ICD-10-CM | POA: Diagnosis not present

## 2018-08-14 DIAGNOSIS — Z6822 Body mass index (BMI) 22.0-22.9, adult: Secondary | ICD-10-CM | POA: Diagnosis not present

## 2018-08-14 DIAGNOSIS — M159 Polyosteoarthritis, unspecified: Secondary | ICD-10-CM | POA: Diagnosis not present

## 2018-08-14 DIAGNOSIS — L570 Actinic keratosis: Secondary | ICD-10-CM | POA: Diagnosis not present

## 2018-08-19 DIAGNOSIS — Z6822 Body mass index (BMI) 22.0-22.9, adult: Secondary | ICD-10-CM | POA: Diagnosis not present

## 2018-08-19 DIAGNOSIS — M25559 Pain in unspecified hip: Secondary | ICD-10-CM | POA: Diagnosis not present

## 2018-08-19 DIAGNOSIS — M25551 Pain in right hip: Secondary | ICD-10-CM | POA: Diagnosis not present

## 2018-09-09 DIAGNOSIS — Z6822 Body mass index (BMI) 22.0-22.9, adult: Secondary | ICD-10-CM | POA: Diagnosis not present

## 2018-09-09 DIAGNOSIS — M792 Neuralgia and neuritis, unspecified: Secondary | ICD-10-CM | POA: Diagnosis not present

## 2018-09-09 DIAGNOSIS — Z79899 Other long term (current) drug therapy: Secondary | ICD-10-CM | POA: Diagnosis not present

## 2018-10-14 DIAGNOSIS — Z79899 Other long term (current) drug therapy: Secondary | ICD-10-CM | POA: Diagnosis not present

## 2018-10-14 DIAGNOSIS — M159 Polyosteoarthritis, unspecified: Secondary | ICD-10-CM | POA: Diagnosis not present

## 2018-10-14 DIAGNOSIS — E7800 Pure hypercholesterolemia, unspecified: Secondary | ICD-10-CM | POA: Diagnosis not present

## 2018-10-14 DIAGNOSIS — I4891 Unspecified atrial fibrillation: Secondary | ICD-10-CM | POA: Diagnosis not present

## 2018-10-14 DIAGNOSIS — I495 Sick sinus syndrome: Secondary | ICD-10-CM | POA: Diagnosis not present

## 2018-10-14 DIAGNOSIS — E559 Vitamin D deficiency, unspecified: Secondary | ICD-10-CM | POA: Diagnosis not present

## 2018-10-14 DIAGNOSIS — N289 Disorder of kidney and ureter, unspecified: Secondary | ICD-10-CM | POA: Diagnosis not present

## 2018-10-14 DIAGNOSIS — M7661 Achilles tendinitis, right leg: Secondary | ICD-10-CM | POA: Diagnosis not present

## 2018-10-14 DIAGNOSIS — K219 Gastro-esophageal reflux disease without esophagitis: Secondary | ICD-10-CM | POA: Diagnosis not present

## 2018-10-16 DIAGNOSIS — S12101D Unspecified nondisplaced fracture of second cervical vertebra, subsequent encounter for fracture with routine healing: Secondary | ICD-10-CM | POA: Diagnosis not present

## 2018-10-16 DIAGNOSIS — S12001D Unspecified nondisplaced fracture of first cervical vertebra, subsequent encounter for fracture with routine healing: Secondary | ICD-10-CM | POA: Diagnosis not present

## 2018-10-16 DIAGNOSIS — S161XXA Strain of muscle, fascia and tendon at neck level, initial encounter: Secondary | ICD-10-CM | POA: Diagnosis not present

## 2018-11-05 DIAGNOSIS — Z6822 Body mass index (BMI) 22.0-22.9, adult: Secondary | ICD-10-CM | POA: Diagnosis not present

## 2018-11-05 DIAGNOSIS — M159 Polyosteoarthritis, unspecified: Secondary | ICD-10-CM | POA: Diagnosis not present

## 2018-11-05 DIAGNOSIS — Z79899 Other long term (current) drug therapy: Secondary | ICD-10-CM | POA: Diagnosis not present

## 2018-11-10 DIAGNOSIS — I110 Hypertensive heart disease with heart failure: Secondary | ICD-10-CM | POA: Diagnosis not present

## 2018-11-10 DIAGNOSIS — R51 Headache: Secondary | ICD-10-CM | POA: Diagnosis not present

## 2018-11-10 DIAGNOSIS — S0990XA Unspecified injury of head, initial encounter: Secondary | ICD-10-CM | POA: Diagnosis not present

## 2018-11-10 DIAGNOSIS — I509 Heart failure, unspecified: Secondary | ICD-10-CM | POA: Diagnosis not present

## 2018-11-10 DIAGNOSIS — Z950 Presence of cardiac pacemaker: Secondary | ICD-10-CM | POA: Diagnosis not present

## 2018-11-10 DIAGNOSIS — S0993XA Unspecified injury of face, initial encounter: Secondary | ICD-10-CM | POA: Diagnosis not present

## 2018-11-10 DIAGNOSIS — I4891 Unspecified atrial fibrillation: Secondary | ICD-10-CM | POA: Diagnosis not present

## 2018-11-20 DIAGNOSIS — M159 Polyosteoarthritis, unspecified: Secondary | ICD-10-CM | POA: Diagnosis not present

## 2018-11-20 DIAGNOSIS — Z6822 Body mass index (BMI) 22.0-22.9, adult: Secondary | ICD-10-CM | POA: Diagnosis not present

## 2018-11-20 DIAGNOSIS — Z9181 History of falling: Secondary | ICD-10-CM | POA: Diagnosis not present

## 2018-11-20 DIAGNOSIS — Z1331 Encounter for screening for depression: Secondary | ICD-10-CM | POA: Diagnosis not present

## 2018-12-03 NOTE — Progress Notes (Signed)
Office Visit Note  Patient: Heather Thomas             Date of Birth: 1927/09/26           MRN: [id number]             PCP: Helen Hashimoto., MD Referring: Helen Hashimoto., MD Visit Date: 12/05/2018 Occupation: _0 @  Subjective:  Pain in both hands.   History of Present Illness: Heather Thomas is a 85 y.o. female seen in consultation per evaluation of arthritis.  According to patient she has had osteoarthritis in her hands for many years.  The pain has been more intense in the last 3 months.  She states she took 1 tablet of Mobic but it caused a lot of GI discomfort and she stopped the medication.  She denies pain or discomfort in any other joints.  She states she has some symptoms of right hip joint discomfort for which she had x-rays by her doctor which was negative.  She has some discomfort in the right trochanteric bursa when she lays on the right side.  None of the other joints are painful.  She states she is not very strong in her lower extremities and falls easily.  She does not like to use walker as often.  She also has history of osteoporosis.  She states she has been getting Prolia shots but recently she has been having difficulty affording the Prolia injections.  Activities of Daily Living:  Patient reports morning stiffness for 0  minute.   Patient Denies nocturnal pain.  Difficulty dressing/grooming: Denies Difficulty climbing stairs: Denies Difficulty getting out of chair: Reports Difficulty using hands for taps, buttons, cutlery, and/or writing: Reports  Review of Systems  Constitutional: Negative for fatigue.  HENT: Negative for mouth sores, mouth dryness and nose dryness.   Eyes: Negative for dryness.  Respiratory: Negative for shortness of breath and difficulty breathing.   Cardiovascular: Negative for chest pain and hypertension.  Gastrointestinal: Positive for diarrhea. Negative for blood in stool and constipation.  Endocrine: Positive for cold  intolerance and increased urination.  Genitourinary: Negative for difficulty urinating and painful urination.  Musculoskeletal: Positive for arthralgias and joint pain. Negative for joint swelling, myalgias, muscle weakness, morning stiffness, muscle tenderness and myalgias.  Skin: Negative for color change, rash and sensitivity to sunlight.  Allergic/Immunologic: Negative for susceptible to infections.  Neurological: Negative for dizziness, fainting and headaches.  Hematological: Negative for bruising/bleeding tendency.  Psychiatric/Behavioral: Positive for sleep disturbance. Negative for depressed mood. The patient is nervous/anxious.     PMFS History:  Patient Active Problem List   Diagnosis Date Noted  . History of vertebral fracture 12/05/2018  . History of osteoporosis 12/05/2018  . Vitamin D deficiency 12/05/2018  . History of atrial fibrillation 12/05/2018  . History of esophageal reflux 12/05/2018  . Hypercholesteremia 12/05/2018  . Renal insufficiency 12/05/2018  . Primary osteoarthritis of both hands 12/05/2018  . Post-nasal drip 02/21/2018  . PAF (paroxysmal atrial fibrillation) ([place]) 08/21/2017  . Atypical chest pain 03/30/2016  . Bradycardia 08/12/2015  . Pacemaker 08/12/2015    Past Medical History:  Diagnosis Date  . A-fib ([place])   . Acid reflux   . AF (paroxysmal atrial fibrillation) ([place])   . Anemia   . DJD (degenerative joint disease)   . Osteoporosis   . PMR (polymyalgia rheumatica) ([facility])     Family History  Problem Relation Age of Onset  . Diabetes Daughter   . Heart disease Daughter   .  Cervical cancer Daughter   . Heart attack Mother   . Lung disease Son   . Colon cancer Neg Hx   . Esophageal cancer Neg Hx    Past Surgical History:  Procedure Laterality Date  . ABDOMINAL HYSTERECTOMY    . COLONOSCOPY  07/09/2003   Colonic sigmoid diverticulosis. Otherwise normal attempted colonoscopy to 35cm.   . ESOPHAGOGASTRODUODENOSCOPY  10/15/2014    Erosive gastritis. Otherwise normal EGD,   . INSERT / REPLACE / REMOVE PACEMAKER     [employer]tr[employer]   Social History   Social History Narrative  . Not on file    There is no immunization history on file for this patient.   Objective: Vital Signs: BP 110/69 (BP Location: Right Arm, Patient Position: Sitting, Cuff Size: Normal)   Pulse 68   Resp 13   Ht 4' 8.5" (1.435 m)   Wt 101 lb (45.8 kg)   BMI 22.24 kg/m    Physical Exam Vitals signs and nursing note reviewed.  Constitutional:      Appearance: She is well-developed.  HENT:     Head: Normocephalic and atraumatic.  Eyes:     Conjunctiva/sclera: Conjunctivae normal.  Neck:     Musculoskeletal: Normal range of motion.  Cardiovascular:     Rate and Rhythm: Normal rate and regular rhythm.     Heart sounds: Normal heart sounds.  Pulmonary:     Effort: Pulmonary effort is normal.     Breath sounds: Normal breath sounds.  Abdominal:     General: Bowel sounds are normal.     Palpations: Abdomen is soft.  Lymphadenopathy:     Cervical: No cervical adenopathy.  Skin:    General: Skin is warm and dry.     Capillary Refill: Capillary refill takes less than 2 seconds.  Neurological:     Mental Status: She is alert and oriented to person, place, and time.  Psychiatric:        Behavior: Behavior normal.      Musculoskeletal Exam: Limited range of motion of C-spine and thoracic spine.  She has severe thoracic kyphosis.  Shoulder joints elbow joints wrist joints with good range of motion.  She has DIP and PIP thickening with some inflammatory changes in her left second and third PIP joint.  Hip joints knee joints ankles were in good range of motion with no synovitis.  She has some osteoarthritic changes in her feet.  CDAI Exam: CDAI Score: Not documented Patient Global Assessment: Not documented; Provider Global Assessment: Not documented Swollen: Not documented; Tender: Not documented Joint Exam   Not documented   There is  currently no information documented on the homunculus. Go to the Rheumatology activity and complete the homunculus joint exam.  Investigation: No additional findings.  Imaging: No results found.  Recent Labs: Lab Results  Component Value Date   HGB 13.4 10/15/2008   NA 137 10/15/2008   K 4.2 10/15/2008   CL 98 10/15/2008   CO2 29 10/15/2008   GLUCOSE 84 10/15/2008   BUN 15 10/15/2008   CREATININE 0.74 10/15/2008   CALCIUM 9.6 10/15/2008   GFRAA  10/15/2008    >60        The eGFR has been calculated using the MDRD equation. This calculation has not been validated in all clinical situations. eGFR's persistently <60 mL/min signify possible Chronic Kidney Disease.    Speciality Comments: No specialty comments available.  Procedures:  No procedures performed Allergies: Codeine   Assessment / Plan:  Visit Diagnoses: Primary osteoarthritis of both hands-patient has severe osteoarthritis in her hands with PIP and DIP thickening.  She has some inflammatory component and her PIP joints.  I offered cortisone injection to her left third and fourth PIP joints but she declined.  I am hesitant to give her Voltaren gel as she has renal insufficiency.  Have advised her to use Aspercreme and use Tylenol for pain.  I have also given her a handout on hand exercises.  None of the other joints are painful.  She had intolerance to Mobic which she has taken in the past.  Frequent falls-patient states she falls quite easily and does not feel very strong.  I have referred her to physical therapy for fall prevention and muscle strengthening.  History of vertebral fracture - C1 and C2 fracture s/p fall in 2017h/o of multiple compression fractures in lumbar region  History of osteoporosis-patient states that she has been treated with IV Prolia but recently she is having difficulty getting it due to insurance issues.  Have advised her to contact her PCP regarding other treatment options like  Reclast or Forteo.  Vitamin D deficiency-followed by her PCP  Tachy-brady syndrome ([place])  History of atrial fibrillation - on pacemaker  History of esophageal reflux  Hypercholesteremia  Renal insufficiency   Orders: No orders of the defined types were placed in this encounter.  No orders of the defined types were placed in this encounter.    Follow-Up Instructions: Return if symptoms worsen or fail to improve, for Osteoarthritis.   Bo Merino, MD  Note - This record has been created using Editor, commissioning.  Chart creation errors have been sought, but may not always  have been located. Such creation errors do not reflect on  the standard of medical care.

## 2018-12-05 ENCOUNTER — Encounter: Payer: Self-pay | Admitting: Rheumatology

## 2018-12-05 ENCOUNTER — Ambulatory Visit: Payer: Medicare HMO | Admitting: Rheumatology

## 2018-12-05 VITALS — BP 110/69 | HR 68 | Resp 13 | Ht <= 58 in | Wt 101.0 lb

## 2018-12-05 DIAGNOSIS — N289 Disorder of kidney and ureter, unspecified: Secondary | ICD-10-CM

## 2018-12-05 DIAGNOSIS — E559 Vitamin D deficiency, unspecified: Secondary | ICD-10-CM

## 2018-12-05 DIAGNOSIS — Z8679 Personal history of other diseases of the circulatory system: Secondary | ICD-10-CM

## 2018-12-05 DIAGNOSIS — M19041 Primary osteoarthritis, right hand: Secondary | ICD-10-CM

## 2018-12-05 DIAGNOSIS — E7800 Pure hypercholesterolemia, unspecified: Secondary | ICD-10-CM

## 2018-12-05 DIAGNOSIS — R296 Repeated falls: Secondary | ICD-10-CM

## 2018-12-05 DIAGNOSIS — Z8781 Personal history of (healed) traumatic fracture: Secondary | ICD-10-CM

## 2018-12-05 DIAGNOSIS — Z8719 Personal history of other diseases of the digestive system: Secondary | ICD-10-CM

## 2018-12-05 DIAGNOSIS — Z8739 Personal history of other diseases of the musculoskeletal system and connective tissue: Secondary | ICD-10-CM

## 2018-12-05 DIAGNOSIS — M19042 Primary osteoarthritis, left hand: Secondary | ICD-10-CM

## 2018-12-05 DIAGNOSIS — I495 Sick sinus syndrome: Secondary | ICD-10-CM | POA: Diagnosis not present

## 2018-12-05 HISTORY — DX: Personal history of (healed) traumatic fracture: Z87.81

## 2018-12-05 HISTORY — DX: Personal history of other diseases of the digestive system: Z87.19

## 2018-12-05 HISTORY — DX: Primary osteoarthritis, right hand: M19.041

## 2018-12-05 HISTORY — DX: Pure hypercholesterolemia, unspecified: E78.00

## 2018-12-05 HISTORY — DX: Vitamin D deficiency, unspecified: E55.9

## 2018-12-05 HISTORY — DX: Primary osteoarthritis, left hand: M19.042

## 2018-12-05 HISTORY — DX: Personal history of other diseases of the circulatory system: Z86.79

## 2018-12-05 HISTORY — DX: Disorder of kidney and ureter, unspecified: N28.9

## 2018-12-05 HISTORY — DX: Personal history of other diseases of the musculoskeletal system and connective tissue: Z87.39

## 2018-12-05 NOTE — Patient Instructions (Signed)
1.  Use Aspercreme to the finger joints for swelling.   2. Discuss other treatments for osteoporosis with your doctor. 3.  Take prescription for physical therapy for fall prevention and muscle strengthening   Hand Exercises Hand exercises can be helpful to almost anyone. These exercises can strengthen the hands, improve flexibility and movement, and increase blood flow to the hands. These results can make work and daily tasks easier. Hand exercises can be especially helpful for people who have joint pain from arthritis or have nerve damage from overuse (carpal tunnel syndrome). These exercises can also help people who have injured a hand. Most of these hand exercises are fairly gentle stretching routines. You can do them often throughout the day. Still, it is a good idea to ask your health care provider which exercises would be best for you. Warming your hands before exercise may help to reduce stiffness. You can do this with gentle massage or by placing your hands in warm water for 15 minutes. Also, make sure you pay attention to your level of hand pain as you begin an exercise routine. Exercises Knuckle bend Repeat this exercise 5-10 times with each hand. 1. Stand or sit with your arm, hand, and all five fingers pointed straight up. Make sure your wrist is straight. 2. Gently and slowly bend your fingers down and inward until the tips of your fingers are touching the tops of your palm. 3. Hold this position for a few seconds. 4. Extend your fingers out to their original position, all pointing straight up again. Finger fan Repeat this exercise 5-10 times with each hand. 1. Hold your arm and hand out in front of you. Keep your wrist straight. 2. Squeeze your hand into a fist. 3. Hold this position for a few seconds. 4. Loel Dubonnet out, or spread apart, your hand and fingers as much as possible, stretching every joint fully. Tabletop Repeat this exercise 5-10 times with each hand. 1. Stand or sit with  your arm, hand, and all five fingers pointed straight up. Make sure your wrist is straight. 2. Gently and slowly bend your fingers at the knuckles where they meet the hand until your hand is making an upside-down L shape. Your fingers should form a tabletop. 3. Hold this position for a few seconds. 4. Extend your fingers out to their original position, all pointing straight up again. Making Os Repeat this exercise 5-10 times with each hand. 1. Stand or sit with your arm, hand, and all five fingers pointed straight up. Make sure your wrist is straight. 2. Make an O shape by touching your pointer finger to your thumb. Hold for a few seconds. Then open your hand wide. 3. Repeat this motion with each finger on your hand. Table spread Repeat this exercise 5-10 times with each hand. 1. Place your hand on a table with your palm facing down. Make sure your wrist is straight. 2. Spread your fingers out as much as possible. Hold this position for a few seconds. 3. Slide your fingers back together again. Hold for a few seconds. Ball grip Repeat this exercise 10-15 times with each hand. 1. Hold a tennis ball or another soft ball in your hand. 2. While slowly increasing pressure, squeeze the ball as hard as possible. 3. Squeeze as hard as you can for 3-5 seconds. 4. Relax and repeat.  Wrist curls Repeat this exercise 10-15 times with each hand. 1. Sit in a chair that has armrests. 2. Hold a light weight in  your hand, such as a dumbbell that weighs 1-3 pounds (0.5-1.4 kg). Ask your health care provider what weight would be best for you. 3. Rest your hand just over the end of the chair arm with your palm facing up. 4. Gently pivot your wrist up and down while holding the weight. Do not twist your wrist from side to side. Contact a health care provider if:  Your hand pain or discomfort gets much worse when you do an exercise.  Your hand pain or discomfort does not improve within 2 hours after you  exercise. If you have any of these problems, stop doing these exercises right away. Do not do them again unless your health care provider says that you can. Get help right away if:  You develop sudden, severe hand pain. If this happens, stop doing these exercises right away. Do not do them again unless your health care provider says that you can. This information is not intended to replace advice given to you by your health care provider. Make sure you discuss any questions you have with your health care provider. Document Released: 08/30/2015 Document Revised: 01/22/2018 Document Reviewed: 03/29/2015 Elsevier Interactive Patient Education  2019 [employer].

## 2018-12-10 DIAGNOSIS — M25551 Pain in right hip: Secondary | ICD-10-CM | POA: Diagnosis not present

## 2018-12-10 DIAGNOSIS — R2689 Other abnormalities of gait and mobility: Secondary | ICD-10-CM | POA: Diagnosis not present

## 2018-12-10 DIAGNOSIS — R2681 Unsteadiness on feet: Secondary | ICD-10-CM | POA: Diagnosis not present

## 2018-12-10 DIAGNOSIS — M6281 Muscle weakness (generalized): Secondary | ICD-10-CM | POA: Diagnosis not present

## 2018-12-10 DIAGNOSIS — R296 Repeated falls: Secondary | ICD-10-CM | POA: Diagnosis not present

## 2018-12-23 DIAGNOSIS — J029 Acute pharyngitis, unspecified: Secondary | ICD-10-CM | POA: Diagnosis not present

## 2019-01-15 DIAGNOSIS — Z6822 Body mass index (BMI) 22.0-22.9, adult: Secondary | ICD-10-CM | POA: Diagnosis not present

## 2019-01-15 DIAGNOSIS — F411 Generalized anxiety disorder: Secondary | ICD-10-CM | POA: Diagnosis not present

## 2019-01-15 DIAGNOSIS — Z79899 Other long term (current) drug therapy: Secondary | ICD-10-CM | POA: Diagnosis not present

## 2019-01-15 DIAGNOSIS — M159 Polyosteoarthritis, unspecified: Secondary | ICD-10-CM | POA: Diagnosis not present

## 2019-02-12 DIAGNOSIS — Z6822 Body mass index (BMI) 22.0-22.9, adult: Secondary | ICD-10-CM | POA: Diagnosis not present

## 2019-02-12 DIAGNOSIS — M8588 Other specified disorders of bone density and structure, other site: Secondary | ICD-10-CM | POA: Diagnosis not present

## 2019-02-12 DIAGNOSIS — W19XXXA Unspecified fall, initial encounter: Secondary | ICD-10-CM | POA: Diagnosis not present

## 2019-02-12 DIAGNOSIS — M25551 Pain in right hip: Secondary | ICD-10-CM | POA: Diagnosis not present

## 2019-02-14 ENCOUNTER — Other Ambulatory Visit: Payer: Self-pay | Admitting: Family

## 2019-02-14 ENCOUNTER — Telehealth: Payer: Self-pay | Admitting: *Deleted

## 2019-02-14 DIAGNOSIS — R0789 Other chest pain: Secondary | ICD-10-CM

## 2019-02-14 MED ORDER — NITROGLYCERIN 0.4 MG SL SUBL: 0.4000 mg | SUBLINGUAL_TABLET | SUBLINGUAL | 3 refills | Status: DC | PRN

## 2019-02-14 NOTE — Telephone Encounter (Signed)
Having burning feeling in chest. Sent to [facility] because pt could not hear me.

## 2019-02-14 NOTE — Telephone Encounter (Signed)
Pt called in to state that she is having a "burning" sensation in the center of her chest since this morning. The burning is non-radiating and she denies shortness of breath. Patient has taken Pepto Bismal and Tums with no relief. States has had this type of burning before but never lasts this long. Spoke with Dr. Tomie China and Luther Parody walker NP who advised to order nitroglycerin 0.4 mg sublingual and get some  Nexium when she picks up the nitro. Advised patient how to nitroglycerin (When having chest pain, stop what you are doing and sit down. Take 1 nitro, wait 5 minutes. Still having chest pain, take 1 nitro, wait 5 minutes. Still having chest pain, take 1 nitro, dial 911. Total of 3 nitro in 15 minutes.) Pt verbalized understanding. She is going to the pharmacy now with her grandson.

## 2019-02-14 NOTE — Telephone Encounter (Signed)
Patient states she had stuffed bell peppers with spaghetti sauce last night for dinner and she has been having a burning sensation in her chest today. She tried a home remedy of pepto bismal but it is not helping. Last took her meds at 0500 today, RN advised that she can try TUMS antacid. If her pain worsens, SOB or arm pain onsets, patient instructed to go to emergency room. Patient asked to be added to Dr. Charm Rings schedule for Monday. No further questions at this time.

## 2019-02-14 NOTE — Progress Notes (Signed)
Pt called complaining of atypical chest pain. Burning mid chest and does not radiate. No SOB. PMH PAF, PPM. No known CAD, per Dr. Kirtland Bouchard note stress test 2018 was negative.  Vernona Rieger RN spoke with pt. Recommended OTC prilosec for burning. Education given on nitro prescription. Nitro Rx sent. 911 and ED precautions given.   Alver Sorrow, NP

## 2019-02-17 ENCOUNTER — Encounter: Payer: Self-pay | Admitting: Cardiology

## 2019-02-17 ENCOUNTER — Other Ambulatory Visit: Payer: Self-pay

## 2019-02-17 ENCOUNTER — Telehealth (INDEPENDENT_AMBULATORY_CARE_PROVIDER_SITE_OTHER): Payer: Medicare HMO | Admitting: Cardiology

## 2019-02-17 DIAGNOSIS — Z950 Presence of cardiac pacemaker: Secondary | ICD-10-CM

## 2019-02-17 DIAGNOSIS — R001 Bradycardia, unspecified: Secondary | ICD-10-CM

## 2019-02-17 DIAGNOSIS — I480 Paroxysmal atrial fibrillation: Secondary | ICD-10-CM | POA: Diagnosis not present

## 2019-02-17 DIAGNOSIS — R0789 Other chest pain: Secondary | ICD-10-CM

## 2019-02-17 NOTE — Patient Instructions (Signed)
Medication Instructions:  Your physician recommends that you continue on your current medications as directed. Please refer to the Current Medication list given to you today.  If you need a refill on your cardiac medications before your next appointment, please call your pharmacy.   Lab work: None.  If you have labs (blood work) drawn today and your tests are completely normal, you will receive your results only by: Marland Kitchen MyChart Message (if you have MyChart) OR . A paper copy in the mail If you have any lab test that is abnormal or we need to change your treatment, we will call you to review the results.  Testing/Procedures: None.   Follow-Up: At [facility], you and your health needs are our priority.  As part of our continuing mission to provide you with exceptional heart care, we have created designated Provider Care Teams.  These Care Teams include your primary Cardiologist (physician) and Advanced Practice Providers (APPs -  Physician Assistants and Nurse Practitioners) who all work together to provide you with the care you need, when you need it. You will need a follow up appointment in 3 days.  Please call our office 2 months in advance to schedule this appointment.  You may see No primary care provider on file. or another member of our [employer] Provider Team in [place]: Norman Herrlich, MD . Belva Crome, MD  Any Other Special Instructions Will Be Listed Below (If Applicable).  You will have you pacemaker interrogated on the day of your appointment.

## 2019-02-17 NOTE — Progress Notes (Signed)
Virtual Visit via Telephone Note   This visit type was conducted due to national recommendations for restrictions regarding the COVID-19 Pandemic (e.g. social distancing) in an effort to limit this patient's exposure and mitigate transmission in our community.  Due to her co-morbid illnesses, this patient is at least at moderate risk for complications without adequate follow up.  This format is felt to be most appropriate for this patient at this time.  The patient did not have access to video technology/had technical difficulties with video requiring transitioning to audio format only (telephone).  All issues noted in this document were discussed and addressed.  No physical exam could be performed with this format.  Please refer to the patient's chart for her  consent to telehealth for [facility].  Evaluation Performed:  Follow-up visit  This visit type was conducted due to national recommendations for restrictions regarding the COVID-19 Pandemic (e.g. social distancing).  This format is felt to be most appropriate for this patient at this time.  All issues noted in this document were discussed and addressed.  No physical exam was performed (except for noted visual exam findings with Video Visits).  Please refer to the patient's chart (MyChart message for video visits and phone note for telephone visits) for the patient's consent to telehealth for [facility].  Date:  02/17/2019  ID: [name], DOB 07/04/1927, MRN [id number]   Patient Location: [name] [place] [address]   Provider location:   [facility] [place] [facility]  PCP:  Wilmer Floor., MD  Cardiologist:  Gypsy Balsam, MD     Chief Complaint: I am doing much better  History of Present Illness:    Heather Thomas is a 85 y.o. female  who presents via audio/video conferencing for a telehealth visit today.  Complex past medical history which includes pacemaker, paroxysmal atrial fibrillation, not  anticoagulated because of very fragile state and simply does not want it.  Recently she ended up calling our office complaining of having chest pain.  Actually 911 was called they came they did EKG and they told her she had no problem to go out.  She is getting proton pump inhibitor and feels significantly better she is to follow-up with Dr. Chales Abrahams but since he left she does not see any gastroenterologist.  We talked in length about the situation and since she is doing very well I think we will wait and see how situation will evolve.  Look like she responded nicely to proton pump inhibitor.  I will bring her to the office this week to have her device interrogated.  She has not been interrogated for long time.  Also we will switch her care in terms of pacemaker to our service.  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   The patient does not have symptoms concerning for COVID-19 infection (fever, chills, cough, or new SHORTNESS OF BREATH).    Prior CV studies:   The following studies were reviewed today:       Past Medical History:  Diagnosis Date  . A-fib ([facility])   . Acid reflux   . AF (paroxysmal atrial fibrillation) ([facility])   . Anemia   . DJD (degenerative joint disease)   . Osteoporosis   . PMR (polymyalgia rheumatica) ([facility])     Past Surgical History:  Procedure Laterality  Date  . ABDOMINAL HYSTERECTOMY    . COLONOSCOPY  07/09/2003   Colonic sigmoid diverticulosis. Otherwise normal attempted colonoscopy to 35cm.   . ESOPHAGOGASTRODUODENOSCOPY  10/15/2014   Erosive gastritis. Otherwise normal EGD,   . INSERT / REPLACE / REMOVE PACEMAKER     [employer]     Current Meds  Medication Sig  . Dexlansoprazole (DEXILANT PO) Take 1 capsule by mouth daily.  . nitroGLYCERIN (NITROSTAT) 0.4 MG SL tablet Place 1 tablet (0.4 mg total) under the tongue every 5 (five) minutes as  needed for chest pain.  . sotalol (BETAPACE) 80 MG tablet Take 0.5 tablets (40 mg total) by mouth 2 (two) times daily.      Family History: The patient's family history includes Cervical cancer in her daughter; Diabetes in her daughter; Heart attack in her mother; Heart disease in her daughter; Lung disease in her son. There is no history of Colon cancer or Esophageal cancer.   ROS:   Please see the history of present illness.     All other systems reviewed and are negative.   Labs/Other Tests and Data Reviewed:     Recent Labs: No results found for requested labs within last 8760 hours.  Recent Lipid Panel No results found for: CHOL, TRIG, HDL, CHOLHDL, VLDL, LDLCALC, LDLDIRECT    Exam:    Vital Signs:  There were no vitals taken for this visit.    Wt Readings from Last 3 Encounters:  12/05/18 101 lb (45.8 kg)  07/25/18 99 lb 2 oz (45 kg)  04/11/18 94 lb (42.6 kg)     Well nourished, well developed in no acute distress. Alert awake oriented x3 not in distress very happy and cheerful happy to be able to talk to me.  Diagnosis for this visit:   1. PAF (paroxysmal atrial fibrillation) (HCC)   2. Atypical chest pain   3. Bradycardia   4. Pacemaker      ASSESSMENT & PLAN:    1.  Paroxysmal atrial fibrillation, not anticoagulated because of fragile state.  She is taking sotalol which I will continue is only 40 mg twice daily.  We will bring her to the office to interrogate her device to see how much atrial fibrillation she has a family 2.  Atypical chest pain successfully managed with proton pump inhibitor which I will continue however I bring her to the office to have her EKG redone. 3.  Bradycardia addressed with pacemaker. 4.  Pacemaker present we will make sure she will have device interrogated.  I will see her later this week in the office.  COVID-19 Education: The signs and symptoms of COVID-19 were discussed with the patient and how to seek care for testing  (follow up with PCP or arrange E-visit).  The importance of social distancing was discussed today.  Patient Risk:   After full review of this patients clinical status, I feel that they are at least moderate risk at this time.  Time:   Today, I have spent 15 minutes with the patient with telehealth technology discussing pt health issues.  I spent 5 minutes reviewing her chart before the visit.  Visit was finished at 3:48 PM.    Medication Adjustments/Labs and Tests Ordered: Current medicines are reviewed at length with the patient today.  Concerns regarding medicines are outlined above.  No orders of the defined types were placed in this encounter.  Medication changes: No orders of the defined types were placed in this encounter.    Disposition:  Follow-up this week in the office for pacemaker interrogation in the visit  Signed, Georgeanna Lea, MD, [facility] 02/17/2019 3:48 PM    [facility]

## 2019-02-18 DIAGNOSIS — Z79899 Other long term (current) drug therapy: Secondary | ICD-10-CM | POA: Diagnosis not present

## 2019-02-18 DIAGNOSIS — K2950 Unspecified chronic gastritis without bleeding: Secondary | ICD-10-CM | POA: Diagnosis not present

## 2019-02-20 ENCOUNTER — Ambulatory Visit (INDEPENDENT_AMBULATORY_CARE_PROVIDER_SITE_OTHER): Payer: Medicare HMO | Admitting: Cardiology

## 2019-02-20 ENCOUNTER — Other Ambulatory Visit: Payer: Self-pay

## 2019-02-20 ENCOUNTER — Encounter: Payer: Self-pay | Admitting: Cardiology

## 2019-02-20 VITALS — BP 130/60 | HR 64 | Wt 102.0 lb

## 2019-02-20 DIAGNOSIS — I480 Paroxysmal atrial fibrillation: Secondary | ICD-10-CM | POA: Diagnosis not present

## 2019-02-20 DIAGNOSIS — R0789 Other chest pain: Secondary | ICD-10-CM | POA: Diagnosis not present

## 2019-02-20 DIAGNOSIS — E7800 Pure hypercholesterolemia, unspecified: Secondary | ICD-10-CM | POA: Diagnosis not present

## 2019-02-20 DIAGNOSIS — Z950 Presence of cardiac pacemaker: Secondary | ICD-10-CM | POA: Diagnosis not present

## 2019-02-20 NOTE — Progress Notes (Signed)
Cardiology Office Note:    Date:  02/20/2019   ID:  Heather Thomas, DOB [birth year], MRN [id number]  PCP:  Wilmer Floor., MD  Cardiologist:  Gypsy Balsam, MD    Referring MD: Wilmer Floor., MD   Chief Complaint  Patient presents with  . Follow-up  I am doing fair  History of Present Illness:    Heather Thomas is a 85 y.o. 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  She does have a pacemaker which is a Medtronic device and she comes today to have her pacemaker interrogated.  She described to have some abdominal pain it happens after she eats certain type of food she takes some proton pump inhibitor with good relief.  There is no exertional chest pain.  No dizziness no passing out no palpitations overall she seems to be doing well  Past Medical History:  Diagnosis Date  . A-fib (HCC)   . Acid reflux   . AF (paroxysmal atrial fibrillation) (HCC)   . Anemia   . DJD (degenerative joint disease)   . Osteoporosis   . PMR (polymyalgia rheumatica) (HCC)     Past Surgical History:  Procedure Laterality Date  . ABDOMINAL HYSTERECTOMY    . COLONOSCOPY  07/09/2003   Colonic sigmoid diverticulosis. Otherwise normal attempted colonoscopy to 35cm.   . ESOPHAGOGASTRODUODENOSCOPY  10/15/2014   Erosive gastritis. Otherwise normal EGD,   . INSERT / REPLACE / REMOVE PACEMAKER     Medtronic    Current Medications: Current Meds  Medication Sig  . Dexlansoprazole (DEXILANT PO) Take 1 capsule by mouth daily.  . nitroGLYCERIN (NITROSTAT) 0.4 MG SL tablet Place 1 tablet (0.4 mg total) under the tongue every 5 (five) minutes as needed for chest pain.  . sotalol (BETAPACE) 80 MG tablet Take 0.5 tablets (40 mg total) by mouth 2 (two) times daily.     Allergies:   Codeine   Social History   Socioeconomic History  . Marital status: Married    Spouse name: Not on file  . Number of  children: 3  . Years of education: Not on file  . Highest education level: Not on file  Occupational History  . Not on file  Social Needs  . Financial resource strain: Not on file  . Food insecurity:    Worry: Not on file    Inability: Not on file  . Transportation needs:    Medical: Not on file    Non-medical: Not on file  Tobacco Use  . Smoking status: Never Smoker  . Smokeless tobacco: Never Used  Substance and Sexual Activity  . Alcohol use: No    Frequency: Never  . Drug use: No  . Sexual activity: Not on file  Lifestyle  . Physical activity:    Days per week: Not on file    Minutes per session: Not on file  . Stress: Not on file  Relationships  . Social connections:    Talks on phone: Not on file    Gets together: Not on file    Attends religious service: Not on file    Active member of club or organization: Not on file    Attends meetings of clubs or organizations: Not on file    Relationship status: Not on file  Other Topics Concern  . Not on file  Social History Narrative  . Not on file  Family History: The patient's family history includes Cervical cancer in her daughter; Diabetes in her daughter; Heart attack in her mother; Heart disease in her daughter; Lung disease in her son. There is no history of Colon cancer or Esophageal cancer. ROS:   Please see the history of present illness.    All 14 point review of systems negative except as described per history of present illness  EKGs/Labs/Other Studies Reviewed:      Recent Labs: No results found for requested labs within last 8760 hours.  Recent Lipid Panel No results found for: CHOL, TRIG, HDL, CHOLHDL, VLDL, LDLCALC, LDLDIRECT  Physical Exam:    VS:  BP 130/60   Pulse 64   Wt 102 lb (46.3 kg)   BMI 22.46 kg/m     Wt Readings from Last 3 Encounters:  02/20/19 102 lb (46.3 kg)  12/05/18 101 lb (45.8 kg)  07/25/18 99 lb 2 oz (45 kg)     GEN:  Well nourished, well developed in no acute  distress HEENT: Normal NECK: No JVD; No carotid bruits LYMPHATICS: No lymphadenopathy CARDIAC: RRR, no murmurs, no rubs, no gallops RESPIRATORY:  Clear to auscultation without rales, wheezing or rhonchi  ABDOMEN: Soft, non-tender, non-distended MUSCULOSKELETAL:  No edema; No deformity  SKIN: Warm and dry LOWER EXTREMITIES: no swelling NEUROLOGIC:  Alert and oriented x 3 PSYCHIATRIC:  Normal affect   ASSESSMENT:    1. PAF (paroxysmal atrial fibrillation) (HCC)   2. Pacemaker   3. Hypercholesteremia   4. Atypical chest pain    PLAN:    In order of problems listed above:  1. Paroxysmal atrial fibrillation.  Not anticoagulated because of fragile state.  She is suppressed with sotalol.  Interrogation of the device did not show any evidence of atrial fibrillation.  We will continue present management 2. Pacemaker interrogated today this is a Medtronic device will get 27 months left in the device between 8 and 46 months.  Impedances are stable and normal, thresholds are so acceptable atrial pacing threshold is 0.5 V at 0.4 ms ventricle pacing threshold 0.5 V at 0.4 ms, sensing 40 P wave is between 4 and 5.6.  Ventricle is between 8 and 11.2.  We will continue present setting.  There is no tachyarrhythmic episodes. 3. Hypercholesterolemia.  I will make arrangements for fasting lipid profile to be checked. 4. Epigastric/chest pain.  Atypical for heart not related to exercise happening after certain type of food.  She is also scheduled to see her primary care physician as well as gastroenterologist.   Medication Adjustments/Labs and Tests Ordered: Current medicines are reviewed at length with the patient today.  Concerns regarding medicines are outlined above.  No orders of the defined types were placed in this encounter.  Medication changes: No orders of the defined types were placed in this encounter.   Signed, Georgeanna Leaobert J. Tymeshia Awan, MD, [facility] 02/20/2019 3:32 PM    [facility]

## 2019-02-20 NOTE — Patient Instructions (Addendum)
Medication Instructions:  Your physician recommends that you continue on your current medications as directed. Please refer to the Current Medication list given to you today.  If you need a refill on your cardiac medications before your next appointment, please call your pharmacy.   Lab work: None.  If you have labs (blood work) drawn today and your tests are completely normal, you will receive your results only by: . MyChart Message (if you have MyChart) OR . A paper copy in the mail If you have any lab test that is abnormal or we need to change your treatment, we will call you to review the results.  Testing/Procedures: None.   Follow-Up: At [facility], you and your health needs are our priority.  As part of our continuing mission to provide you with exceptional heart care, we have created designated Provider Care Teams.  These Care Teams include your primary Cardiologist (physician) and Advanced Practice Providers (APPs -  Physician Assistants and Nurse Practitioners) who all work together to provide you with the care you need, when you need it. You will need a follow up appointment in 3 months.  Please call our office 2 months in advance to schedule this appointment.  You may see No primary care provider on file. or another member of our [facility] Provider Team in [place]: Brian Munley, MD . Rajan Revankar, MD  Any Other Special Instructions Will Be Listed Below (If Applicable).     

## 2019-03-11 DIAGNOSIS — M7060 Trochanteric bursitis, unspecified hip: Secondary | ICD-10-CM | POA: Diagnosis not present

## 2019-03-24 DIAGNOSIS — E7800 Pure hypercholesterolemia, unspecified: Secondary | ICD-10-CM | POA: Diagnosis not present

## 2019-03-24 DIAGNOSIS — Z79899 Other long term (current) drug therapy: Secondary | ICD-10-CM | POA: Diagnosis not present

## 2019-03-24 DIAGNOSIS — K219 Gastro-esophageal reflux disease without esophagitis: Secondary | ICD-10-CM | POA: Diagnosis not present

## 2019-03-24 DIAGNOSIS — N289 Disorder of kidney and ureter, unspecified: Secondary | ICD-10-CM | POA: Diagnosis not present

## 2019-03-24 DIAGNOSIS — M810 Age-related osteoporosis without current pathological fracture: Secondary | ICD-10-CM | POA: Diagnosis not present

## 2019-03-24 DIAGNOSIS — I4891 Unspecified atrial fibrillation: Secondary | ICD-10-CM | POA: Diagnosis not present

## 2019-03-24 DIAGNOSIS — K2950 Unspecified chronic gastritis without bleeding: Secondary | ICD-10-CM | POA: Diagnosis not present

## 2019-03-24 DIAGNOSIS — I495 Sick sinus syndrome: Secondary | ICD-10-CM | POA: Diagnosis not present

## 2019-03-24 DIAGNOSIS — E559 Vitamin D deficiency, unspecified: Secondary | ICD-10-CM | POA: Diagnosis not present

## 2019-03-24 DIAGNOSIS — M159 Polyosteoarthritis, unspecified: Secondary | ICD-10-CM | POA: Diagnosis not present

## 2019-04-07 DIAGNOSIS — M79651 Pain in right thigh: Secondary | ICD-10-CM | POA: Diagnosis not present

## 2019-04-07 DIAGNOSIS — M6281 Muscle weakness (generalized): Secondary | ICD-10-CM | POA: Diagnosis not present

## 2019-04-07 DIAGNOSIS — R2681 Unsteadiness on feet: Secondary | ICD-10-CM | POA: Diagnosis not present

## 2019-04-07 DIAGNOSIS — R2689 Other abnormalities of gait and mobility: Secondary | ICD-10-CM | POA: Diagnosis not present

## 2019-04-21 ENCOUNTER — Other Ambulatory Visit: Payer: Self-pay | Admitting: Cardiology

## 2019-04-21 DIAGNOSIS — M6281 Muscle weakness (generalized): Secondary | ICD-10-CM | POA: Diagnosis not present

## 2019-04-21 DIAGNOSIS — R2681 Unsteadiness on feet: Secondary | ICD-10-CM | POA: Diagnosis not present

## 2019-04-21 DIAGNOSIS — R2689 Other abnormalities of gait and mobility: Secondary | ICD-10-CM | POA: Diagnosis not present

## 2019-04-21 DIAGNOSIS — M79651 Pain in right thigh: Secondary | ICD-10-CM | POA: Diagnosis not present

## 2019-04-21 MED ORDER — SOTALOL HCL 80 MG PO TABS: 40.0000 mg | ORAL_TABLET | Freq: Two times a day (BID) | ORAL | 1 refills | Status: DC

## 2019-04-21 NOTE — Telephone Encounter (Signed)
°*  STAT* If patient is at the pharmacy, call can be transferred to refill team.   1. Which medications need to be refilled? (please list name of each medication and dose if known) Sotalol 80mg  takes 1/2 in am and 1/2 in PM  2. Which pharmacy/location (including street and city if local pharmacy) is medication to be sent to? [facility] on [address]  3. Do they need a 30 day or 90 day supply? [id number]  Pharmacy states that we have not responded to request and asked pt if she wanted to change Dr's??/    paitent is down to only 2 pills left and needs asap!

## 2019-04-21 NOTE — Addendum Note (Signed)
Addended by: Jerl Santos R on: 04/21/2019 02:24 PM   Modules accepted: Orders

## 2019-04-21 NOTE — Telephone Encounter (Signed)
Rx refill sent to pharmacy. 

## 2019-04-22 DIAGNOSIS — Z1331 Encounter for screening for depression: Secondary | ICD-10-CM | POA: Diagnosis not present

## 2019-04-22 DIAGNOSIS — Z9181 History of falling: Secondary | ICD-10-CM | POA: Diagnosis not present

## 2019-04-22 DIAGNOSIS — E785 Hyperlipidemia, unspecified: Secondary | ICD-10-CM | POA: Diagnosis not present

## 2019-04-22 DIAGNOSIS — Z0000 Encounter for general adult medical examination without abnormal findings: Secondary | ICD-10-CM | POA: Diagnosis not present

## 2019-04-28 DIAGNOSIS — M6281 Muscle weakness (generalized): Secondary | ICD-10-CM | POA: Diagnosis not present

## 2019-04-28 DIAGNOSIS — R2681 Unsteadiness on feet: Secondary | ICD-10-CM | POA: Diagnosis not present

## 2019-04-28 DIAGNOSIS — R2689 Other abnormalities of gait and mobility: Secondary | ICD-10-CM | POA: Diagnosis not present

## 2019-04-28 DIAGNOSIS — M79651 Pain in right thigh: Secondary | ICD-10-CM | POA: Diagnosis not present

## 2019-05-05 DIAGNOSIS — M79651 Pain in right thigh: Secondary | ICD-10-CM | POA: Diagnosis not present

## 2019-05-05 DIAGNOSIS — R2681 Unsteadiness on feet: Secondary | ICD-10-CM | POA: Diagnosis not present

## 2019-05-05 DIAGNOSIS — M6281 Muscle weakness (generalized): Secondary | ICD-10-CM | POA: Diagnosis not present

## 2019-05-13 DIAGNOSIS — M1711 Unilateral primary osteoarthritis, right knee: Secondary | ICD-10-CM | POA: Diagnosis not present

## 2019-05-20 DIAGNOSIS — K219 Gastro-esophageal reflux disease without esophagitis: Secondary | ICD-10-CM | POA: Diagnosis not present

## 2019-05-20 DIAGNOSIS — K2950 Unspecified chronic gastritis without bleeding: Secondary | ICD-10-CM | POA: Diagnosis not present

## 2019-05-20 DIAGNOSIS — E46 Unspecified protein-calorie malnutrition: Secondary | ICD-10-CM | POA: Diagnosis not present

## 2019-05-20 DIAGNOSIS — Z681 Body mass index (BMI) 19 or less, adult: Secondary | ICD-10-CM | POA: Diagnosis not present

## 2019-05-20 DIAGNOSIS — M159 Polyosteoarthritis, unspecified: Secondary | ICD-10-CM | POA: Diagnosis not present

## 2019-05-23 ENCOUNTER — Other Ambulatory Visit: Payer: Self-pay

## 2019-05-23 ENCOUNTER — Encounter: Payer: Self-pay | Admitting: Cardiology

## 2019-05-23 ENCOUNTER — Telehealth (INDEPENDENT_AMBULATORY_CARE_PROVIDER_SITE_OTHER): Payer: Medicare HMO | Admitting: Cardiology

## 2019-05-23 VITALS — Wt 96.0 lb

## 2019-05-23 DIAGNOSIS — Z950 Presence of cardiac pacemaker: Secondary | ICD-10-CM

## 2019-05-23 DIAGNOSIS — E7800 Pure hypercholesterolemia, unspecified: Secondary | ICD-10-CM

## 2019-05-23 DIAGNOSIS — I480 Paroxysmal atrial fibrillation: Secondary | ICD-10-CM | POA: Diagnosis not present

## 2019-05-23 NOTE — Patient Instructions (Signed)
Medication Instructions:  Your physician recommends that you continue on your current medications as directed. Please refer to the Current Medication list given to you today.  If you need a refill on your cardiac medications before your next appointment, please call your pharmacy.   Lab work: None ordered If you have labs (blood work) drawn today and your tests are completely normal, you will receive your results only by: Marland Kitchen MyChart Message (if you have MyChart) OR . A paper copy in the mail If you have any lab test that is abnormal or we need to change your treatment, we will call you to review the results.  Testing/Procedures: None ordered  Follow-Up: At [facility], you and your health needs are our priority.  As part of our continuing mission to provide you with exceptional heart care, we have created designated Provider Care Teams.  These Care Teams include your primary Cardiologist (physician) and Advanced Practice Providers (APPs -  Physician Assistants and Nurse Practitioners) who all work together to provide you with the care you need, when you need it. You will need a follow up appointment in 4 months.  You may see  [facility] or another member of our [employer] Provider Team in [place]: Shirlee More, MD . Jyl Heinz, MD  Any Other Special Instructions Will Be Listed Below (If Applicable). You have been referred to see EP.  You will be contacted to set up an appointment with Dr. Curt Bears in our [place] office.

## 2019-05-23 NOTE — Progress Notes (Signed)
Virtual Visit via Telephone Note   This visit type was conducted due to national recommendations for restrictions regarding the COVID-19 Pandemic (e.g. social distancing) in an effort to limit this patient's exposure and mitigate transmission in our community.  Due to her co-morbid illnesses, this patient is at least at moderate risk for complications without adequate follow up.  This format is felt to be most appropriate for this patient at this time.  The patient did not have access to video technology/had technical difficulties with video requiring transitioning to audio format only (telephone).  All issues noted in this document were discussed and addressed.  No physical exam could be performed with this format.  Please refer to the patient's chart for her  consent to telehealth for [facility].  Evaluation Performed:  Follow-up visit  This visit type was conducted due to national recommendations for restrictions regarding the COVID-19 Pandemic (e.g. social distancing).  This format is felt to be most appropriate for this patient at this time.  All issues noted in this document were discussed and addressed.  No physical exam was performed (except for noted visual exam findings with Video Visits).  Please refer to the patient's chart (MyChart message for video visits and phone note for telephone visits) for the patient's consent to telehealth for [facility].  Date:  05/23/2019  ID: Heather Thomas, DOB 22-Dec-1926, MRN [id number]   Patient Location[place] [address] [place] [id number]   Provider location:   [facility] [place] [facility]  PCP:  Wilmer Floorampbell, Stephen D., MD  Cardiologist:  Gypsy Balsamobert Krasowski, MD     Chief Complaint: Doing well cardiac wise  History of Present Illness:    Heather BandBetty S Thomas is a 85 y.o. female  who presents via audio/video conferencing for a telehealth visit today.  With history of pacemaker, proximal atrial fibrillation, acid reflux, she does have a video visit  with me today.  She is unable to establish video link therefore we only talk over the phone.  She is doing well cardiac wise described to have some heartburn for which proton pump inhibitor seems to be helping.  Biggest complaint she has is the fact that she lost about 12 pounds.  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    Prior CV studies:   The following studies were reviewed today:       Past Medical History:  Diagnosis Date  . A-fib (HCC)   . Acid reflux   . AF (paroxysmal atrial fibrillation) (HCC)   . Anemia   . DJD (degenerative joint disease)   . Osteoporosis   . PMR (polymyalgia rheumatica) (HCC)     Past Surgical History:  Procedure Laterality Date  . ABDOMINAL HYSTERECTOMY    . COLONOSCOPY  07/09/2003   Colonic sigmoid diverticulosis. Otherwise normal attempted colonoscopy to 35cm.   . ESOPHAGOGASTRODUODENOSCOPY  10/15/2014   Erosive gastritis. Otherwise normal EGD,   . INSERT / REPLACE / REMOVE PACEMAKER     Medtronic     Current Meds  Medication Sig  . Dexlansoprazole (DEXILANT PO)  Take 1 capsule by mouth daily.  . nitroGLYCERIN (NITROSTAT) 0.4 MG SL tablet Place 1 tablet (0.4 mg total) under the tongue every 5 (five) minutes as needed for chest pain.  . sotalol (BETAPACE) 80 MG tablet Take 0.5 tablets (40 mg total) by mouth 2 (two) times daily.      Family History: The patient's family history includes Cervical cancer in her daughter; Diabetes in her daughter; Heart attack in her mother; Heart disease in her daughter; Lung disease in her son. There is no history of Colon cancer or Esophageal  cancer.   ROS:   Please see the history of present illness.     All other systems reviewed and are negative.   Labs/Other Tests and Data Reviewed:     Recent Labs: No results found for requested labs within last 8760 hours.  Recent Lipid Panel No results found for: CHOL, TRIG, HDL, CHOLHDL, VLDL, LDLCALC, LDLDIRECT    Exam:    Vital Signs:  Wt 96 lb (43.5 kg)   BMI 21.14 kg/m     Wt Readings from Last 3 Encounters:  05/23/19 96 lb (43.5 kg)  02/20/19 102 lb (46.3 kg)  12/05/18 101 lb (45.8 kg)     Well nourished, well developed in no acute distress. Alert awake and x3.  We talked over the phone she is happy to be able to talk to me.  Asymptomatic at the time of my interview not in any distress  Diagnosis for this visit:   1. PAF (paroxysmal atrial fibrillation) ([place])   2. Pacemaker   3. Hypercholesteremia      ASSESSMENT & PLAN:    1.  Paroxysmal atrial fibrillation, not anticoagulated because of fragile state.  On sotalol continue.  No recent recording of atrial fibrillation on the monitor/pacemaker. 2.  Pacemaker present we will make arrangements for involving her in our pacemaker clinic.  Last interrogation showed normal battery function 3.  Dyslipidemia.  We will make arrangements for check cholesterol check she is taking pravastatin 20 which I will continue.  COVID-19 Education: The signs and symptoms of COVID-19 were discussed with the patient and how to seek care for testing (follow up with PCP or arrange E-visit).  The importance of social distancing was discussed today.  Patient Risk:   After full review of this patients clinical status, I feel that they are at least moderate risk at this time.  Time:   Today, I have spent 15 minutes with the patient with telehealth technology discussing pt health issues.  I spent 5 minutes reviewing her chart before the visit.  Visit was finished at 1:35 PM.    Medication Adjustments/Labs and Tests Ordered:  Current medicines are reviewed at length with the patient today.  Concerns regarding medicines are outlined above.  No orders of the defined types were placed in this encounter.  Medication changes: No orders of the defined types were placed in this encounter.    Disposition: Follow-up in 4 months  Signed, Park Liter, MD, [facility] 05/23/2019 1:38 PM    [place]

## 2019-05-24 DIAGNOSIS — R350 Frequency of micturition: Secondary | ICD-10-CM | POA: Diagnosis not present

## 2019-05-24 DIAGNOSIS — R600 Localized edema: Secondary | ICD-10-CM | POA: Diagnosis not present

## 2019-05-26 DIAGNOSIS — M6281 Muscle weakness (generalized): Secondary | ICD-10-CM | POA: Diagnosis not present

## 2019-05-26 DIAGNOSIS — M79651 Pain in right thigh: Secondary | ICD-10-CM | POA: Diagnosis not present

## 2019-05-26 DIAGNOSIS — R2681 Unsteadiness on feet: Secondary | ICD-10-CM | POA: Diagnosis not present

## 2019-06-03 ENCOUNTER — Encounter: Payer: Self-pay | Admitting: Gastroenterology

## 2019-06-03 ENCOUNTER — Ambulatory Visit (INDEPENDENT_AMBULATORY_CARE_PROVIDER_SITE_OTHER): Payer: Medicare HMO | Admitting: Gastroenterology

## 2019-06-03 ENCOUNTER — Other Ambulatory Visit: Payer: Self-pay

## 2019-06-03 VITALS — BP 128/66 | HR 72 | Temp 98.0°F | Ht <= 58 in | Wt 97.4 lb

## 2019-06-03 DIAGNOSIS — K5730 Diverticulosis of large intestine without perforation or abscess without bleeding: Secondary | ICD-10-CM

## 2019-06-03 DIAGNOSIS — K219 Gastro-esophageal reflux disease without esophagitis: Secondary | ICD-10-CM

## 2019-06-03 MED ORDER — ONDANSETRON 4 MG PO TBDP: 4.0000 mg | ORAL_TABLET | Freq: Three times a day (TID) | ORAL | 0 refills | Status: DC | PRN

## 2019-06-03 NOTE — Patient Instructions (Signed)
If you are age 83 or older, your body mass index should be between 23-30. Your Body mass index is 21.45 kg/m. If this is out of the aforementioned range listed, please consider follow up with your Primary Care Provider.  If you are age 107 or younger, your body mass index should be between 19-25. Your Body mass index is 21.45 kg/m. If this is out of the aformentioned range listed, please consider follow up with your Primary Care Provider.   We have sent the following medications to your pharmacy for you to pick up at your convenience: Zofran   Continue taking [place].  Please purchase the following medications over the counter and take as directed: Probiotic once daily.   Please call Dr. Leland Her nurse Karl Pock, RN)  in 2 weeks at [phone]  to let her now how you are doing.    Thank you,  Dr. Jackquline Denmark

## 2019-06-03 NOTE — Progress Notes (Signed)
Chief Complaint: Lower abdominal pain  Referring Provider: Dr. Regino Schultze      ASSESSMENT AND PLAN;   #1.  IBS with constipation, now with diarrhea (had diarrhea after oatmeal/raison cookies)  #2.  GERD.  #3. Lower abdominal pain (better with defecation)  Plan: - Dexilant 60mg  po qd. she has samples. - Stop Nexium as it may contribute to diarrhea. - Probiotics 1 tablet p.o. once a day. - Call in 2 weeks. - If not better in 2 weeks, check CBC, CMP, CT abdo/pelvis with PO/IV contrast at [facility]. - Zofran 4mg  ODT #20, 2 refiils - She does not want another colonoscopy.  I do agree. HPI:    Heather Thomas is a 85 y.o. female  Nausea Diarrhea x 38month For follow-up visit History of chronic constipation in past. Used to have diarrhea after eating 3 reason oatmeal cookies  Lower abdominal pain which gets better with defecation No melena or hematochezia.  No fever or chills.  No weight loss.  Had attempted colonoscopy 07/2003 up to 35 cm, severe sigmoid diverticulosis. Past Medical History:  Diagnosis Date  . A-fib ([place])   . Acid reflux   . AF (paroxysmal atrial fibrillation) ([place])   . Anemia   . DJD (degenerative joint disease)   . Osteoporosis   . PMR (polymyalgia rheumatica) (HCC)     Past Surgical History:  Procedure Laterality Date  . ABDOMINAL HYSTERECTOMY    . COLONOSCOPY  07/09/2003   Colonic sigmoid diverticulosis. Otherwise normal attempted colonoscopy to 35cm.   . ESOPHAGOGASTRODUODENOSCOPY  10/15/2014   Erosive gastritis. Otherwise normal EGD,   . INSERT / REPLACE / REMOVE PACEMAKER     Medtronic    Family History  Problem Relation Age of Onset  . Diabetes Daughter   . Heart disease Daughter   . Cervical cancer Daughter   . Heart attack Mother   . Lung disease Son   . Colon cancer Neg Hx   . Esophageal cancer Neg Hx     Social History   Tobacco Use  . Smoking status: Never Smoker  . Smokeless tobacco: Never Used  Substance Use Topics   . Alcohol use: No    Frequency: Never  . Drug use: No    Current Outpatient Medications  Medication Sig Dispense Refill  . esomeprazole (NEXIUM) 40 MG capsule Take 40 mg by mouth daily.     . fesoterodine (TOVIAZ) 4 MG TB24 tablet Take 4 mg by mouth at bedtime.    Marland Kitchen OLANZapine (ZYPREXA) 2.5 MG tablet Take 2.5 mg by mouth at bedtime.    . sotalol (BETAPACE) 80 MG tablet Take 0.5 tablets (40 mg total) by mouth 2 (two) times daily. 90 tablet 1  . Dexlansoprazole (DEXILANT PO) Take 1 capsule by mouth daily.    . nitroGLYCERIN (NITROSTAT) 0.4 MG SL tablet Place 1 tablet (0.4 mg total) under the tongue every 5 (five) minutes as needed for chest pain. (Patient not taking: Reported on 06/03/2019) 90 tablet 3   No current facility-administered medications for this visit.     Allergies  Allergen Reactions  . Codeine Nausea Only    Review of Systems:  Negative except for HPI     Physical Exam:    BP 128/66   Pulse 72   Temp 98 F (36.7 C)   Ht 4' 8.5" (1.435 m)   Wt 97 lb 6 oz (44.2 kg)   BMI 21.45 kg/m  Filed Weights   06/03/19 1453  Weight:  97 lb 6 oz (44.2 kg)   Constitutional:  Well-developed, in no acute distress.  Frail female, uses walker Psychiatric: Normal mood and affect. Behavior is normal. HEENT: Pupils normal.  Conjunctivae are normal. No scleral icterus. Cardiovascular: Normal rate, regular rhythm. No edema Pulmonary/chest: Effort normal and breath sounds normal. No wheezing, rales or rhonchi. Abdominal: Soft, nondistended. Nontender. Bowel sounds active throughout. There are no masses palpable. No hepatomegaly. Rectal:  defered Neurological: Alert and oriented to person place and time. Has significant kyphosis. Skin: Skin is warm and dry. No rashes noted. I spent 15 minutes of face-to-face time with the patient. Greater than 50% of the time was spent counseling and coordinating care.  I also discussed above with grandson.    Edman Circleaj Lavonna Lampron, MD 06/03/2019, 3:05 PM   Cc: Wilmer Floorampbell, Stephen D., MD

## 2019-06-10 ENCOUNTER — Telehealth: Payer: Self-pay | Admitting: Gastroenterology

## 2019-06-10 ENCOUNTER — Telehealth: Payer: Self-pay | Admitting: Cardiology

## 2019-06-10 DIAGNOSIS — K5730 Diverticulosis of large intestine without perforation or abscess without bleeding: Secondary | ICD-10-CM

## 2019-06-10 DIAGNOSIS — K219 Gastro-esophageal reflux disease without esophagitis: Secondary | ICD-10-CM

## 2019-06-10 DIAGNOSIS — K581 Irritable bowel syndrome with constipation: Secondary | ICD-10-CM

## 2019-06-10 NOTE — Telephone Encounter (Signed)
Pt reported that Dexilant is not working for her.

## 2019-06-10 NOTE — Telephone Encounter (Signed)
Called patient and she reports she is having acid reflux in the middle of her chest and pain. She denies radiation of pain, shortness of breath, or nausea. She reports her medicine from her GI doctor normally helps but this time it isn't. Patient scheduled for appointment with Dr. Agustin Cree next week as requested. She was currently advised to go to the emergency department per Dr. Rondel Oh. No further questions.

## 2019-06-10 NOTE — Telephone Encounter (Signed)
Is having acid reflux and it's hurting her pacemaker?

## 2019-06-12 NOTE — Telephone Encounter (Signed)
Patient has been taking Dexilant for a week now and states it is not working. How would you like to proceed with care?

## 2019-06-16 DIAGNOSIS — Z6821 Body mass index (BMI) 21.0-21.9, adult: Secondary | ICD-10-CM | POA: Diagnosis not present

## 2019-06-16 DIAGNOSIS — S41119A Laceration without foreign body of unspecified upper arm, initial encounter: Secondary | ICD-10-CM | POA: Diagnosis not present

## 2019-06-16 NOTE — Telephone Encounter (Signed)
Go back to Nexium 40 mg p.o. once a day (she has this.  If she does not call in 30) check CBC, CMP, lipase CT abdo/pelvis with PO/IV contrast at [facility] RE: abdominal pain See last note. Thx RG

## 2019-06-17 MED ORDER — ESOMEPRAZOLE MAGNESIUM 40 MG PO CPDR: 40.0000 mg | DELAYED_RELEASE_CAPSULE | Freq: Every day | ORAL | 0 refills | Status: DC

## 2019-06-17 NOTE — Telephone Encounter (Signed)
Called and spoke with patient-patient informed of MD recommendations; patient is agreeable with plan of care and has requested to be scheduled at [facility] for lab work and CT scan; orders faxed to [facility] for completion of MD recommendations; RX has been sent to pharmacy of patient choice; Patient verbalized understanding of information/instructions;  Patient was advised to call the office at [phone] if questions/concerns arise;

## 2019-06-17 NOTE — Telephone Encounter (Signed)
Called and spoke with patient-patient given information concerning CT scan and lab work scheduled on 06/24/2019 at [facility] at 9:30am arrival time and 10:30am CT scan time; patient also aware of need to pick up prep kit at [facility] prior to the 06/24/2019 appt date/time; patient advised to call back to the office should questions/concerns arise; patient verbalized understanding of information/instructions;

## 2019-06-18 ENCOUNTER — Encounter: Payer: Self-pay | Admitting: Cardiology

## 2019-06-18 ENCOUNTER — Ambulatory Visit (INDEPENDENT_AMBULATORY_CARE_PROVIDER_SITE_OTHER): Payer: Medicare HMO | Admitting: Cardiology

## 2019-06-18 ENCOUNTER — Other Ambulatory Visit: Payer: Self-pay

## 2019-06-18 VITALS — BP 112/60 | HR 64 | Ht <= 58 in | Wt 95.0 lb

## 2019-06-18 DIAGNOSIS — M19041 Primary osteoarthritis, right hand: Secondary | ICD-10-CM

## 2019-06-18 DIAGNOSIS — Z950 Presence of cardiac pacemaker: Secondary | ICD-10-CM

## 2019-06-18 DIAGNOSIS — I480 Paroxysmal atrial fibrillation: Secondary | ICD-10-CM | POA: Diagnosis not present

## 2019-06-18 DIAGNOSIS — R0789 Other chest pain: Secondary | ICD-10-CM | POA: Diagnosis not present

## 2019-06-18 DIAGNOSIS — M19042 Primary osteoarthritis, left hand: Secondary | ICD-10-CM | POA: Diagnosis not present

## 2019-06-18 MED ORDER — RANOLAZINE ER 500 MG PO TB12: 500.0000 mg | ORAL_TABLET | Freq: Two times a day (BID) | ORAL | 2 refills | Status: DC

## 2019-06-18 NOTE — Patient Instructions (Signed)
Medication Instructions:  Your physician has recommended you make the following change in your medication:   START: Ranolazine 500 mg twice daily   If you need a refill on your cardiac medications before your next appointment, please call your pharmacy.   Lab work: None.  If you have labs (blood work) drawn today and your tests are completely normal, you will receive your results only by: . MyChart Message (if you have MyChart) OR . A paper copy in the mail If you have any lab test that isMarland Kitchen abnormal or we need to change your treatment, we will call you to review the results.  Testing/Procedures: None.   Follow-Up: At [facility], you and your health needs are our priority.  As part of our continuing mission to provide you with exceptional heart care, we have created designated Provider Care Teams.  These Care Teams include your primary Cardiologist (physician) and Advanced Practice Providers (APPs -  Physician Assistants and Nurse Practitioners) who all work together to provide you with the care you need, when you need it. You will need a follow up appointment in 1 months.  Please call our office 2 months in advance to schedule this appointment.  You may see No primary care provider on file. or another member of our [facility] Provider Team in [place]: Norman HerrlichBrian Munley, MD . Belva Cromeajan Revankar, MD  Any Other Special Instructions Will Be Listed Below (If Applicable).  Dr. Bing MatterKrasowski has referred you to see Dr. Elberta Fortisamnitz with electrophysiology.   Ranolazine tablets, extended release What is this medicine? RANOLAZINE (ra NOE la zeen) is a heart medicine. It is used to treat chronic chest pain (angina). This medicine must be taken regularly. It will not relieve an acute episode of chest pain. This medicine may be used for other purposes; ask your health care provider or pharmacist if you have questions. COMMON BRAND NAME(S): Ranexa What should I tell my health care provider before I take  this medicine? They need to know if you have any of these conditions:  heart disease  irregular heartbeat  kidney disease  liver disease  low levels of potassium or magnesium in the blood  an unusual or allergic reaction to ranolazine, other medicines, foods, dyes, or preservatives  pregnant or trying to get pregnant  breast-feeding How should I use this medicine? Take this medicine by mouth with a glass of water. Follow the directions on the prescription label. Do not cut, crush, or chew this medicine. Take with or without food. Do not take this medication with grapefruit juice. Take your doses at regular intervals. Do not take your medicine more often then directed. Talk to your pediatrician regarding the use of this medicine in children. Special care may be needed. Overdosage: If you think you have taken too much of this medicine contact a poison control center or emergency room at once. NOTE: This medicine is only for you. Do not share this medicine with others. What if I miss a dose? If you miss a dose, take it as soon as you can. If it is almost time for your next dose, take only that dose. Do not take double or extra doses. What may interact with this medicine? Do not take this medicine with any of the following medications:  antivirals for HIV or AIDS  cerivastatin  certain antibiotics like chloramphenicol, clarithromycin, dalfopristin; quinupristin, isoniazid, rifabutin, rifampin, rifapentine  certain medicines used for cancer like imatinib, nilotinib  certain medicines for fungal infections like fluconazole, itraconazole,  ketoconazole, posaconazole, voriconazole  certain medicines for irregular heart beat like dronedarone  certain medicines for seizures like carbamazepine, fosphenytoin, oxcarbazepine, phenobarbital, phenytoin  cisapride  conivaptan  cyclosporine  grapefruit or grapefruit juice  lumacaftor;  ivacaftor  nefazodone  pimozide  quinacrine  St John's wort  thioridazine This medicine may also interact with the following medications:  alfuzosin  certain medicines for depression, anxiety, or psychotic disturbances like bupropion, citalopram, fluoxetine, fluphenazine, paroxetine, perphenazine, risperidone, sertraline, trifluoperazine  certain medicines for cholesterol like atorvastatin, lovastatin, simvastatin  certain medicines for stomach problems like octreotide, palonosetron, prochlorperazine  eplerenone  ergot alkaloids like dihydroergotamine, ergonovine, ergotamine, methylergonovine  metformin  nicardipine  other medicines that prolong the QT interval (cause an abnormal heart rhythm) like dofetilide, ziprasidone  sirolimus  tacrolimus This list may not describe all possible interactions. Give your health care provider a list of all the medicines, herbs, non-prescription drugs, or dietary supplements you use. Also tell them if you smoke, drink alcohol, or use illegal drugs. Some items may interact with your medicine. What should I watch for while using this medicine? Visit your doctor for regular check ups. Tell your doctor or healthcare professional if your symptoms do not start to get better or if they get worse. This medicine will not relieve an acute attack of angina or chest pain. This medicine can change your heart rhythm. Your health care provider may check your heart rhythm by ordering an electrocardiogram (ECG) while you are taking this medicine. You may get drowsy or dizzy. Do not drive, use machinery, or do anything that needs mental alertness until you know how this medicine affects you. Do not stand or sit up quickly, especially if you are an older patient. This reduces the risk of dizzy or fainting spells. Alcohol may interfere with the effect of this medicine. Avoid alcoholic drinks. If you are scheduled for any medical or dental procedure, tell your  healthcare provider that you are taking this medicine. This medicine can interact with other medicines used during surgery. What side effects may I notice from receiving this medicine? Side effects that you should report to your doctor or health care professional as soon as possible:  allergic reactions like skin rash, itching or hives, swelling of the face, lips, or tongue  breathing problems  changes in vision  fast, irregular or pounding heartbeat  feeling faint or lightheaded, falls  low or high blood pressure  numbness or tingling feelings  ringing in the ears  tremor or shakiness  slow heartbeat (fewer than 50 beats per minute)  swelling of the legs or feet Side effects that usually do not require medical attention (report to your doctor or health care professional if they continue or are bothersome):  constipation  drowsy  dry mouth  headache  nausea or vomiting  stomach upset This list may not describe all possible side effects. Call your doctor for medical advice about side effects. You may report side effects to FDA at 1-800-FDA-1088. Where should I keep my medicine? Keep out of the reach of children. Store at room temperature between 15 and 30 degrees C (59 and 86 degrees F). Throw away any unused medicine after the expiration date. NOTE: This sheet is a summary. It may not cover all possible information. If you have questions about this medicine, talk to your doctor, pharmacist, or health care provider.  2020 Elsevier/Gold Standard (2018-09-10 09:18:49)

## 2019-06-18 NOTE — Addendum Note (Signed)
Addended by: Ashok Norris on: 06/18/2019 09:10 AM   Modules accepted: Orders

## 2019-06-18 NOTE — Progress Notes (Signed)
Cardiology Office Note:    Date:  06/18/2019   ID:  Heather BandBetty S Jeng, DOB 08-13-1927, MRN [id number]  PCP:  Wilmer Floorampbell, Stephen D., MD  Cardiologist:  Gypsy Balsamobert Daneen Volcy, MD    Referring MD: Wilmer Floorampbell, Stephen D., MD   Chief Complaint  Patient presents with  . Chest Pain  I have heartburn  History of Present Illness:    Heather Thomas is a 85 y.o. female with history of paroxysmal atrial fibrillation, acid reflux disease, pacemaker comes today to my office for follow-up.  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  She is alone sits at home and think about problems.  Chief complaint is heartburn she does see Dr. Chales AbrahamsGupta gastroenterologist who made some changes to her medications I understand that he is planning to do CAT scan of her abdomen as well.  Talking to her I cannot distinguish if the pain that she is experiencing is related to her heart or not it happened after she eats it relieved with some drinking more water.  She is to drink a lot of soda but still doing this seems to be feeling better.  Get this sensation sometimes when she gets upset.  Does not do much therefore I cannot determine if she got exertional pain or not.  I think it would be reasonable to try to treat this.  I will give her ranolazine 500 mg twice daily.  I will wait for GI work-up to be complete and if that will be unrevealing stress test will be done.  Past Medical History:  Diagnosis Date  . A-fib (HCC)   . Acid reflux   . AF (paroxysmal atrial fibrillation) (HCC)   . Anemia   . DJD (degenerative joint disease)   . Osteoporosis   . PMR (polymyalgia rheumatica) (HCC)     Past Surgical History:  Procedure Laterality Date  . ABDOMINAL HYSTERECTOMY    . COLONOSCOPY  07/09/2003   Colonic sigmoid diverticulosis. Otherwise normal attempted colonoscopy to 35cm.   .  ESOPHAGOGASTRODUODENOSCOPY  10/15/2014   Erosive gastritis. Otherwise normal EGD,   . INSERT / REPLACE / REMOVE PACEMAKER     [employer]    Current Medications: Current Meds  Medication Sig  . Dexlansoprazole (DEXILANT PO) Take 1 capsule by mouth daily.  . sotalol (BETAPACE) 80 MG tablet Take 0.5 tablets (40 mg total) by mouth 2 (two) times daily.     Allergies:   Codeine   Social History   Socioeconomic History  . Marital status: Married    Spouse name: Not on file  . Number of children: 3  . Years of education: Not on file  . Highest education level: Not on file  Occupational History  . Not on file  Social Needs  . Financial resource strain: Not on file  . Food insecurity    Worry: Not on file    Inability: Not on file  . Transportation needs    Medical: Not on file    Non-medical: Not on file  Tobacco Use  . Smoking status: Never Smoker  . Smokeless tobacco: Never Used  Substance and Sexual Activity  . Alcohol use: No    Frequency: Never  . Drug use: No  . Sexual activity: Not on file  Lifestyle  . Physical activity    Days per week: Not  on file    Minutes per session: Not on file  . Stress: Not on file  Relationships  . Social Herbalist on phone: Not on file    Gets together: Not on file    Attends religious service: Not on file    Active member of club or organization: Not on file    Attends meetings of clubs or organizations: Not on file    Relationship status: Not on file  Other Topics Concern  . Not on file  Social History Narrative  . Not on file     Family History: The patient's family history includes Cervical cancer in her daughter; Diabetes in her daughter; Heart attack in her mother; Heart disease in her daughter; Lung disease in her son. There is no history of Colon cancer or Esophageal cancer. ROS:   Please see the history of present illness.    All 14 point review of systems negative except as described per history of present  illness  EKGs/Labs/Other Studies Reviewed:      Recent Labs: No results found for requested labs within last 8760 hours.  Recent Lipid Panel No results found for: CHOL, TRIG, HDL, CHOLHDL, VLDL, LDLCALC, LDLDIRECT  Physical Exam:    VS:  BP 112/60   Pulse 64   Ht 4' 8.5" (1.435 m)   Wt 95 lb (43.1 kg)   SpO2 97%   BMI 20.92 kg/m     Wt Readings from Last 3 Encounters:  06/18/19 95 lb (43.1 kg)  06/03/19 97 lb 6 oz (44.2 kg)  05/23/19 96 lb (43.5 kg)     GEN:  Well nourished, well developed in no acute distress HEENT: Normal NECK: No JVD; No carotid bruits LYMPHATICS: No lymphadenopathy CARDIAC: RRR, no murmurs, no rubs, no gallops RESPIRATORY:  Clear to auscultation without rales, wheezing or rhonchi  ABDOMEN: Soft, non-tender, non-distended MUSCULOSKELETAL:  No edema; No deformity  SKIN: Warm and dry LOWER EXTREMITIES: no swelling NEUROLOGIC:  Alert and oriented x 3 PSYCHIATRIC:  Normal affect   ASSESSMENT:    1. PAF (paroxysmal atrial fibrillation) ([place])   2. Pacemaker   3. Atypical chest pain    PLAN:    In order of problems listed above:  1. Paroxysmal atrial fibrillation.  Denies having any palpitations.  We will continue present management.  Not anticoagulated because of fragile state. 2. Pacemaker present: I do not see any recent interrogation.  We will schedule her to have pacemaker interrogated we will are normal her in our pacemaker clinic. 3. Atypical chest pain I cannot tell exactly if this is cardiac GI issue.  Plan will be to initiate ranolazine as well as see her shortly after that if GI work-up negative and stress test will be done. 4. Will do EKG today   Medication Adjustments/Labs and Tests Ordered: Current medicines are reviewed at length with the patient today.  Concerns regarding medicines are outlined above.  No orders of the defined types were placed in this encounter.  Medication changes: No orders of the defined types were placed in  this encounter.   Signed, Park Liter, MD, [facility] 06/18/2019 8:58 AM    [place]

## 2019-06-19 ENCOUNTER — Telehealth: Payer: Self-pay | Admitting: Cardiology

## 2019-06-19 ENCOUNTER — Telehealth: Payer: Self-pay | Admitting: Gastroenterology

## 2019-06-19 NOTE — Telephone Encounter (Signed)
Called patient she has questions about her ranexa and nexium she was getting them confused. I advised her to Dr. Steve Rattler office would have to advise on the nexium since he prescribed it and I went over the ranexa RX and instructions with her. She verbally understood. No further questions.

## 2019-06-19 NOTE — Telephone Encounter (Signed)
Please advise 

## 2019-06-19 NOTE — Telephone Encounter (Signed)
Called and spoke with patient-patient is requesting to know if Esomeprazole is the same as Nexium-patient was informed that is the correct medication she needs to be taking as prescribed by Dr. Lyndel Safe; Patient advised to call back to the office should questions/concerns arise; patient verbalized understanding of information/instrcutions;

## 2019-06-19 NOTE — Telephone Encounter (Signed)
Has questions about the prescriptions RJK wrote her yesterday

## 2019-06-19 NOTE — Telephone Encounter (Signed)
Pt states that copay for esomeprazole is $100, she wants to know if she can be prescribed something more affordable.

## 2019-06-20 MED ORDER — RABEPRAZOLE SODIUM 20 MG PO TBEC: 20.0000 mg | DELAYED_RELEASE_TABLET | Freq: Every day | ORAL | 11 refills | Status: DC

## 2019-06-20 NOTE — Telephone Encounter (Signed)
I have sent prescription to patients pharmacy. I also called patient was unable to get an answer or leave a message.

## 2019-06-20 NOTE — Telephone Encounter (Signed)
Lets try AcipHex 20 mg p.o. once a day, 30, 11 refills Please give generic She did respond to that well. Otherwise she can get samples of Dexilant from the office  RG

## 2019-06-23 ENCOUNTER — Telehealth: Payer: Self-pay

## 2019-06-23 NOTE — Telephone Encounter (Signed)
Attempted to contact patient regarding upcoming appointment this week with Dr. Agustin Cree. No answer and no voicemail. Will continue efforts.

## 2019-06-23 NOTE — Telephone Encounter (Signed)
Called patient back and informed her of Dr. Wendy Poet recommendation and she reports she is just too scared to try this. She is worried she will fall and doesn't want to take that chance. If there is something else she can take she would like to know.

## 2019-06-23 NOTE — Telephone Encounter (Signed)
Patient came by the office. She has picked up her Ranolazine and read the side effects. She states that since it causes dizziness she will not be able to take this medication due to having holes in her bones. She is concerned that it will make her fall and break a hip. She wants to talk to Dr Agustin Cree about this and would like to know if something else can be called in. Please give her a call back, she said not to leave her a message since her answering machine is broke.

## 2019-06-23 NOTE — Telephone Encounter (Signed)
Ranolazine vere rarely cause dizziness, can she at least try and see if it make her to feel better

## 2019-06-24 ENCOUNTER — Encounter: Payer: Self-pay | Admitting: Gastroenterology

## 2019-06-24 DIAGNOSIS — R1030 Lower abdominal pain, unspecified: Secondary | ICD-10-CM | POA: Diagnosis not present

## 2019-06-24 DIAGNOSIS — R109 Unspecified abdominal pain: Secondary | ICD-10-CM | POA: Diagnosis not present

## 2019-06-24 NOTE — Telephone Encounter (Signed)
Please advise. Thanks.  

## 2019-06-24 NOTE — Telephone Encounter (Signed)
Pt calling back leaving a message stating that her Ranolazine makes her dizzy and she wanted to know if Dr. Agustin Cree could prescribe her something else. Pt would like a call back concerning this message. Please address

## 2019-06-24 NOTE — Telephone Encounter (Signed)
Attempted to contact patient with no answer, unable to leave message. Will continue efforts to contact as Dr. Agustin Cree has advised to stop ranexa and further medication changes will be discussed during patient's follow up appointment tomorrow, 06/25/2019, at 4:15 pm.

## 2019-06-25 ENCOUNTER — Other Ambulatory Visit: Payer: Self-pay

## 2019-06-25 ENCOUNTER — Ambulatory Visit (INDEPENDENT_AMBULATORY_CARE_PROVIDER_SITE_OTHER): Payer: Medicare HMO | Admitting: Cardiology

## 2019-06-25 ENCOUNTER — Encounter: Payer: Self-pay | Admitting: Cardiology

## 2019-06-25 VITALS — BP 114/58 | HR 70 | Ht 60.0 in | Wt 95.8 lb

## 2019-06-25 DIAGNOSIS — R0789 Other chest pain: Secondary | ICD-10-CM

## 2019-06-25 DIAGNOSIS — I480 Paroxysmal atrial fibrillation: Secondary | ICD-10-CM | POA: Diagnosis not present

## 2019-06-25 DIAGNOSIS — Z950 Presence of cardiac pacemaker: Secondary | ICD-10-CM | POA: Diagnosis not present

## 2019-06-25 MED ORDER — ASPIRIN EC 81 MG PO TBEC: 81.0000 mg | DELAYED_RELEASE_TABLET | Freq: Every day | ORAL | 3 refills | Status: DC

## 2019-06-25 NOTE — Patient Instructions (Signed)

## 2019-06-25 NOTE — Progress Notes (Signed)
Cardiology Office Note:    Date:  06/25/2019   ID:  Heather Thomas, DOB June 10, 1927, MRN [id number]  PCP:  Helen Hashimoto., MD  Cardiologist:  Jenne Campus, MD    Referring MD: Helen Hashimoto., MD   No chief complaint on file. I am afraid to take ranolazine  History of Present Illness:    Heather Thomas is a 85 y.o. female with past medical history significant for paroxysmal atrial fibrillation, no anticoagulated because of some history of GI problem as well as fragile state.  She is on sotalol which seems to be suppressing arrhythmia quite successfully.  Also pacemaker present.  She does complain of having some stomach issue with aggressive evaluation being done by GI.  She does have some worrisome components of the pain I was hoping to give her ranolazine to see if that helps however she refused to take it she read package inserts but said that she can be dizzy and she is afraid to do it this is long discussion about this and simply she will not take it.  She want however to take nitroglycerin as needed like she did take before.  Past Medical History:  Diagnosis Date  . A-fib ([place])   . Acid reflux   . AF (paroxysmal atrial fibrillation) ([place])   . Anemia   . DJD (degenerative joint disease)   . Osteoporosis   . PMR (polymyalgia rheumatica) (HCC)     Past Surgical History:  Procedure Laterality Date  . ABDOMINAL HYSTERECTOMY    . COLONOSCOPY  07/09/2003   Colonic sigmoid diverticulosis. Otherwise normal attempted colonoscopy to 35cm.   . ESOPHAGOGASTRODUODENOSCOPY  10/15/2014   Erosive gastritis. Otherwise normal EGD,   . INSERT / REPLACE / REMOVE PACEMAKER     [employer]    Current Medications: Current Meds  Medication Sig  . esomeprazole (NEXIUM) 40 MG capsule Take 1 capsule (40 mg total) by mouth daily at 12 noon.  . nitroGLYCERIN (NITROSTAT) 0.4 MG SL tablet Place 1 tablet (0.4 mg total) under the tongue every 5 (five) minutes as needed for chest pain.  .  sotalol (BETAPACE) 80 MG tablet Take 0.5 tablets (40 mg total) by mouth 2 (two) times daily.     Allergies:   Codeine   Social History   Socioeconomic History  . Marital status: Married    Spouse name: Not on file  . Number of children: 3  . Years of education: Not on file  . Highest education level: Not on file  Occupational History  . Not on file  Social Needs  . Financial resource strain: Not on file  . Food insecurity    Worry: Not on file    Inability: Not on file  . Transportation needs    Medical: Not on file    Non-medical: Not on file  Tobacco Use  . Smoking status: Never Smoker  . Smokeless tobacco: Never Used  Substance and Sexual Activity  . Alcohol use: No    Frequency: Never  . Drug use: No  . Sexual activity: Not on file  Lifestyle  . Physical activity    Days per week: Not on file    Minutes per session: Not on file  . Stress: Not on file  Relationships  . Social Herbalist on phone: Not on file    Gets together: Not on file    Attends religious service: Not on file    Active member of club or organization:  Not on file    Attends meetings of clubs or organizations: Not on file    Relationship status: Not on file  Other Topics Concern  . Not on file  Social History Narrative  . Not on file     Family History: The patient's family history includes Cervical cancer in her daughter; Diabetes in her daughter; Heart attack in her mother; Heart disease in her daughter; Lung disease in her son. There is no history of Colon cancer or Esophageal cancer. ROS:   Please see the history of present illness.    All 14 point review of systems negative except as described per history of present illness  EKGs/Labs/Other Studies Reviewed:      Recent Labs: No results found for requested labs within last 8760 hours.  Recent Lipid Panel No results found for: CHOL, TRIG, HDL, CHOLHDL, VLDL, LDLCALC, LDLDIRECT  Physical Exam:    VS:  BP (!) 114/58  (BP Location: Left Arm, Patient Position: Sitting, Cuff Size: Small)   Pulse 70   Ht 5' (1.524 m)   Wt 95 lb 12.8 oz (43.5 kg)   SpO2 98%   BMI 18.71 kg/m     Wt Readings from Last 3 Encounters:  06/25/19 95 lb 12.8 oz (43.5 kg)  06/18/19 95 lb (43.1 kg)  06/03/19 97 lb 6 oz (44.2 kg)     GEN:  Well nourished, well developed in no acute distress HEENT: Normal NECK: No JVD; No carotid bruits LYMPHATICS: No lymphadenopathy CARDIAC: RRR, no murmurs, no rubs, no gallops RESPIRATORY:  Clear to auscultation without rales, wheezing or rhonchi  ABDOMEN: Soft, non-tender, non-distended MUSCULOSKELETAL:  No edema; No deformity  SKIN: Warm and dry LOWER EXTREMITIES: no swelling NEUROLOGIC:  Alert and oriented x 3 PSYCHIATRIC:  Normal affect   ASSESSMENT:    1. PAF (paroxysmal atrial fibrillation) (HCC)   2. Atypical chest pain   3. Pacemaker    PLAN:    In order of problems listed above:  1. Paroxysmal atrial fibrillation maintaining sinus rhythm continue present management not anticoagulated because of fragile state 2. Atypical chest pain refused to take any medications.  So far GI medication seems to be helping.  She thinks her doing 56 months old and that is what caused the trouble 3. Pacemaker being followed by our EP team   Medication Adjustments/Labs and Tests Ordered: Current medicines are reviewed at length with the patient today.  Concerns regarding medicines are outlined above.  No orders of the defined types were placed in this encounter.  Medication changes: No orders of the defined types were placed in this encounter.   Signed, Georgeanna Lea, MD, [facility] 06/25/2019 4:53 PM    [facility]

## 2019-06-30 ENCOUNTER — Ambulatory Visit (INDEPENDENT_AMBULATORY_CARE_PROVIDER_SITE_OTHER): Payer: Medicare HMO | Admitting: Cardiology

## 2019-06-30 ENCOUNTER — Other Ambulatory Visit: Payer: Self-pay

## 2019-06-30 ENCOUNTER — Encounter: Payer: Self-pay | Admitting: Cardiology

## 2019-06-30 VITALS — BP 134/68 | HR 60 | Ht 60.0 in | Wt 96.8 lb

## 2019-06-30 DIAGNOSIS — R001 Bradycardia, unspecified: Secondary | ICD-10-CM | POA: Diagnosis not present

## 2019-06-30 DIAGNOSIS — Z950 Presence of cardiac pacemaker: Secondary | ICD-10-CM

## 2019-06-30 DIAGNOSIS — I480 Paroxysmal atrial fibrillation: Secondary | ICD-10-CM

## 2019-06-30 NOTE — Progress Notes (Signed)
Electrophysiology Office Note   Date:  06/30/2019   ID:  Heather Thomas, DOB Sep 18, 1927, MRN [id number]  PCP:  Wilmer Floor., MD  Cardiologist:  Bing Matter Primary Electrophysiologist:  Heather Philipson Jorja Loa, MD    Chief Complaint: atrial fibrillation   History of Present Illness: Heather Thomas is a 85 y.o. female who is being seen today for the evaluation of atrial fibrillation at the request of Georgeanna Lea, MD. Presenting today for electrophysiology evaluation.  She has a history of paroxysmal atrial fibrillation, and is status post Medtronic pacemaker.  She is currently not anticoagulated because, as reported, she is in a fragile state and has had GI issues.  She is currently on sotalol.  Today, she denies symptoms of palpitations, chest pain, shortness of breath, orthopnea, PND, lower extremity edema, claudication, dizziness, presyncope, syncope, bleeding, or neurologic sequela. The patient is tolerating medications without difficulties.    Past Medical History:  Diagnosis Date  . A-fib (HCC)   . Acid reflux   . AF (paroxysmal atrial fibrillation) (HCC)   . Anemia   . DJD (degenerative joint disease)   . Osteoporosis   . PMR (polymyalgia rheumatica) (HCC)    Past Surgical History:  Procedure Laterality Date  . ABDOMINAL HYSTERECTOMY    . COLONOSCOPY  07/09/2003   Colonic sigmoid diverticulosis. Otherwise normal attempted colonoscopy to 35cm.   . ESOPHAGOGASTRODUODENOSCOPY  10/15/2014   Erosive gastritis. Otherwise normal EGD,   . INSERT / REPLACE / REMOVE PACEMAKER     Medtronic     Current Outpatient Medications  Medication Sig Dispense Refill  . aspirin EC 81 MG tablet Take 1 tablet (81 mg total) by mouth daily. 90 tablet 3  . esomeprazole (NEXIUM) 40 MG capsule Take 1 capsule (40 mg total) by mouth daily at 12 noon. 30 capsule 0  . nitroGLYCERIN (NITROSTAT) 0.4 MG SL tablet Place 1 tablet (0.4 mg total) under the tongue every 5 (five) minutes as  needed for chest pain. 90 tablet 3  . sotalol (BETAPACE) 80 MG tablet Take 0.5 tablets (40 mg total) by mouth 2 (two) times daily. 90 tablet 1   No current facility-administered medications for this visit.     Allergies:   Codeine   Social History:  The patient  reports that she has never smoked. She has never used smokeless tobacco. She reports that she does not drink alcohol or use drugs.   Family History:  The patient's family history includes Cervical cancer in her daughter; Diabetes in her daughter; Heart attack in her mother; Heart disease in her daughter; Lung disease in her son.    ROS:  Please see the history of present illness.   Otherwise, review of systems is positive for none.   All other systems are reviewed and negative.    PHYSICAL EXAM: VS:  BP 134/68   Pulse 60   Ht 5' (1.524 m)   Wt 96 lb 12.8 oz (43.9 kg)   SpO2 97%   BMI 18.90 kg/m  , BMI Body mass index is 18.9 kg/m. GEN: Well nourished, well developed, in no acute distress  HEENT: normal  Neck: no JVD, carotid bruits, or masses Cardiac: RRR; no murmurs, rubs, or gallops,no edema  Respiratory:  clear to auscultation bilaterally, normal work of breathing GI: soft, nontender, nondistended, + BS MS: no deformity or atrophy  Skin: warm and dry, device pocket is well healed Neuro:  Strength and sensation are intact Psych: euthymic mood, full affect  EKG:  EKG is not ordered today. Personal review of the ekg ordered 06/18/2019 shows sinus rhythm  Device interrogation is reviewed today in detail.  See PaceArt for details.   Recent Labs: No results found for requested labs within last 8760 hours.    Lipid Panel  No results found for: CHOL, TRIG, HDL, CHOLHDL, VLDL, LDLCALC, LDLDIRECT   Wt Readings from Last 3 Encounters:  06/30/19 96 lb 12.8 oz (43.9 kg)  06/25/19 95 lb 12.8 oz (43.5 kg)  06/18/19 95 lb (43.1 kg)      Other studies Reviewed: Additional studies/ records that were reviewed today  include: TTE 2017  Review of the above records today demonstrates:  The left ventricular size is normal. The left ventricular wall motion is normal. The right ventricle is normal in size and function. Device lead in the right ventricle The left atrial size is normal. Right atrial size is normal. There is aortic valve sclerosis. There is trace aortic regurgitation. There is trace mitral regurgitation. There is mild tricuspid regurgitation. There is no pericardial effusion.   ASSESSMENT AND PLAN:  1.  Paroxysmal atrial fibrillation: Currently not anticoagulated.  On sotalol.  She unfortunately remains in sinus rhythm.  This patients CHA2DS2-VASc Score and unadjusted Ischemic Stroke Rate (% per year) is equal to 3.2 % stroke rate/year from a score of 3  Above score calculated as 1 point each if present [CHF, HTN, DM, Vascular=MI/PAD/Aortic Plaque, Age if 65-74, or Female] Above score calculated as 2 points each if present [Age > 75, or Stroke/TIA/TE]  2.  Sick sinus syndrome:  Status post Medtronic pacemaker.  Device functioning appropriately.  No changes.  We Shane Melby get her arranged for remote monitoring.     Current medicines are reviewed at length with the patient today.   The patient does not have concerns regarding her medicines.  The following changes were made today:  none  Labs/ tests ordered today include:  No orders of the defined types were placed in this encounter.    Disposition:   FU with Saqib Cazarez 1 year  Signed, Phuc Kluttz Meredith Leeds, MD  06/30/2019 10:18 AM     [facility] [address] [place] [address] [phone] (office) [phone] (fax)

## 2019-06-30 NOTE — Patient Instructions (Addendum)
Medication Instructions:  Your physician recommends that you continue on your current medications as directed. Please refer to the Current Medication list given to you today.  *If you need a refill on your cardiac medications before your next appointment, please call your pharmacy*  Labwork: None ordered  Testing/Procedures: None ordered  Follow-Up: Remote monitoring is used to monitor your Pacemaker or ICD from home. This monitoring reduces the number of office visits required to check your device to one time per year. It allows [place] to keep an eye on the functioning of your device to ensure it is working properly. You are scheduled for a device check from home on 09/29/19. You may send your transmission at any time that day. If you have a wireless device, the transmission will be sent automatically. After your physician reviews your transmission, you will receive a postcard with your next transmission date.  Your physician wants you to follow-up in: 1 year with Dr. Curt Bears.  You will receive a reminder letter in the mail two months in advance. If you don't receive a letter, please call our office to schedule the follow-up appointment.  Thank you for choosing [facility]!!   Trinidad Curet, RN [phone]  Any Other Special Instructions Will Be Listed Below (If Applicable).  When you receive your home monitor, please call the device clinic to set it up.  Call the clinic at [phone]

## 2019-07-01 DIAGNOSIS — S41119A Laceration without foreign body of unspecified upper arm, initial encounter: Secondary | ICD-10-CM | POA: Diagnosis not present

## 2019-07-01 DIAGNOSIS — Z6821 Body mass index (BMI) 21.0-21.9, adult: Secondary | ICD-10-CM | POA: Diagnosis not present

## 2019-07-02 ENCOUNTER — Telehealth: Payer: Self-pay | Admitting: Gastroenterology

## 2019-07-03 ENCOUNTER — Telehealth: Payer: Self-pay | Admitting: Gastroenterology

## 2019-07-04 ENCOUNTER — Telehealth: Payer: Self-pay | Admitting: Gastroenterology

## 2019-07-04 NOTE — Telephone Encounter (Signed)
Lets check stool for WBCs, GI pathogens including C. Difficile. Stop taking any fiber supplements Can take Lomotil up to 3 times a day. She has appointment coming up. She can also call us next week to let us know how she is doing.  Thx  RG

## 2019-07-04 NOTE — Telephone Encounter (Signed)
Called and spoke with patient-patient reports she is still having diarrhea after (1) Imodium- patient advised she can take more than one tablet, also advised she can take Lomotil for diarrhea-patient requested to have an appt scheduled -appt on 07/16/2019 at 11:40am; patient advised to call back to the office should questions/concerns arise; Patient verbalized understanding of information/instructions;   Please advise of alternative measures for patient

## 2019-07-04 NOTE — Telephone Encounter (Signed)
I had given CT to Trish previously.  CT results: Looked very good.  No acute abnormalities.  Did show vertebral compression fractures which were chronic, atherosclerotic changes in the aorta which is chronic as well.  Left colonic diverticulosis.  Labs including CBC, CMP, lipase from 06/24/2019 were within normal limits.  Thx  RG

## 2019-07-04 NOTE — Telephone Encounter (Signed)
Please review previous message and advise 

## 2019-07-04 NOTE — Telephone Encounter (Signed)
Pt reported that she had diarrhea all night and that she took an imodium tablet with no relief.

## 2019-07-04 NOTE — Telephone Encounter (Signed)
CT and lab results have been placed on MD desk for review; please advise

## 2019-07-04 NOTE — Telephone Encounter (Signed)
Pt called regarding this msg.  

## 2019-07-07 ENCOUNTER — Telehealth: Payer: Self-pay | Admitting: Gastroenterology

## 2019-07-07 NOTE — Telephone Encounter (Signed)
Would you like to give her a prescription of Lomotil. If so, directions please.

## 2019-07-07 NOTE — Telephone Encounter (Signed)
Called and spoke with patient-patient informed of result note and MD recommendations; patient is agreeable with plan of care; Patient verbalized understanding of information/instructions;  Patient was advised to call the office at [phone] if questions/concerns arise; 

## 2019-07-07 NOTE — Telephone Encounter (Signed)
Patient is calling back about the Lomotil. She said that the pharmacy will not let her get it over the counter and is asking if Dr. Lyndel Safe can call in a prescription of it for her.

## 2019-07-08 NOTE — Telephone Encounter (Signed)
Please do Lomotil 1 tablet p.o. twice daily prn, 60, 1 refill.  Please only use it if she has diarrhea She is prone to constipation  thx  RG

## 2019-07-08 NOTE — Telephone Encounter (Signed)
Heather Thomas, When you speak with this patient please let her know about these recommendations from Dr. Lyndel Safe please. Thank you

## 2019-07-09 MED ORDER — DIPHENOXYLATE-ATROPINE 2.5-0.025 MG PO TABS: 1.0000 | ORAL_TABLET | Freq: Two times a day (BID) | ORAL | 1 refills | Status: DC | PRN

## 2019-07-09 NOTE — Telephone Encounter (Signed)
I have sent prescription to patients pharmacy, patient was informed.   

## 2019-07-10 NOTE — Telephone Encounter (Signed)
Please advise 

## 2019-07-10 NOTE — Telephone Encounter (Signed)
Calling because she was given a medication and she always reads the side effects real good. "rabeph" pt's spelled this can make her prone to brittle bones and she already has osteoporosis. Wants to make sure Dr Lyndel Safe really wants her to take this medication. Tells me years ago she fell and broke her neck and she survived. She doesn't want to fall and break anything else.

## 2019-07-11 ENCOUNTER — Ambulatory Visit: Payer: Medicare HMO | Admitting: Gastroenterology

## 2019-07-11 DIAGNOSIS — Z23 Encounter for immunization: Secondary | ICD-10-CM | POA: Diagnosis not present

## 2019-07-11 NOTE — Telephone Encounter (Signed)
Pt calling about the message below, she would like a call back today.

## 2019-07-11 NOTE — Telephone Encounter (Signed)
I have sent this message to Dr. Lyndel Safe.

## 2019-07-14 ENCOUNTER — Ambulatory Visit: Payer: Medicare HMO | Admitting: Cardiology

## 2019-07-16 ENCOUNTER — Ambulatory Visit: Payer: Medicare HMO | Admitting: Gastroenterology

## 2019-07-17 ENCOUNTER — Other Ambulatory Visit: Payer: Self-pay | Admitting: Cardiology

## 2019-07-18 DIAGNOSIS — Z6821 Body mass index (BMI) 21.0-21.9, adult: Secondary | ICD-10-CM | POA: Diagnosis not present

## 2019-07-18 DIAGNOSIS — L98499 Non-pressure chronic ulcer of skin of other sites with unspecified severity: Secondary | ICD-10-CM | POA: Diagnosis not present

## 2019-07-24 NOTE — Telephone Encounter (Signed)
Tried calling patient.  No answer.   Patient coming in for follow-up appointment Will discuss further.

## 2019-07-28 ENCOUNTER — Ambulatory Visit: Payer: Medicare HMO | Admitting: Cardiology

## 2019-07-28 DIAGNOSIS — M810 Age-related osteoporosis without current pathological fracture: Secondary | ICD-10-CM | POA: Diagnosis not present

## 2019-07-28 NOTE — Telephone Encounter (Signed)
Pt reported that she experiences diarrhea whenever she eats something sweet.  FYI. No need to call back.

## 2019-07-28 NOTE — Telephone Encounter (Signed)
Please review previous message from patient 

## 2019-07-29 NOTE — Telephone Encounter (Signed)
Thanks for letting me know RG 

## 2019-07-30 ENCOUNTER — Ambulatory Visit: Payer: Medicare HMO | Admitting: Gastroenterology

## 2019-08-04 DIAGNOSIS — L299 Pruritus, unspecified: Secondary | ICD-10-CM | POA: Diagnosis not present

## 2019-08-04 DIAGNOSIS — Z79899 Other long term (current) drug therapy: Secondary | ICD-10-CM | POA: Diagnosis not present

## 2019-08-04 DIAGNOSIS — Z6820 Body mass index (BMI) 20.0-20.9, adult: Secondary | ICD-10-CM | POA: Diagnosis not present

## 2019-08-05 DIAGNOSIS — L821 Other seborrheic keratosis: Secondary | ICD-10-CM | POA: Diagnosis not present

## 2019-08-05 DIAGNOSIS — L300 Nummular dermatitis: Secondary | ICD-10-CM | POA: Diagnosis not present

## 2019-08-08 LAB — CUP PACEART INCLINIC DEVICE CHECK
Battery Impedance: 3465 Ohm
Battery Remaining Longevity: 21 mo
Battery Voltage: 2.73 V
Brady Statistic AP VP Percent: 15 %
Brady Statistic AP VS Percent: 1 %
Brady Statistic AS VP Percent: 5 %
Brady Statistic AS VS Percent: 79 %
Date Time Interrogation Session: 20200928124053
Implantable Lead Implant Date: 20100615
Implantable Lead Implant Date: 20100615
Implantable Lead Location: 753859
Implantable Lead Location: 753860
Implantable Lead Model: 5076
Implantable Lead Model: 5092
Implantable Pulse Generator Implant Date: 20100615
Lead Channel Impedance Value: 447 Ohm
Lead Channel Impedance Value: 670 Ohm
Lead Channel Pacing Threshold Amplitude: 0.5 V
Lead Channel Pacing Threshold Amplitude: 0.5 V
Lead Channel Pacing Threshold Pulse Width: 0.4 ms
Lead Channel Pacing Threshold Pulse Width: 0.4 ms
Lead Channel Sensing Intrinsic Amplitude: 4 mV
Lead Channel Sensing Intrinsic Amplitude: 5.6 mV
Lead Channel Setting Pacing Amplitude: 1.5 V
Lead Channel Setting Pacing Amplitude: 2 V
Lead Channel Setting Pacing Pulse Width: 0.4 ms
Lead Channel Setting Sensing Sensitivity: 2.8 mV

## 2019-08-12 ENCOUNTER — Encounter: Payer: Self-pay | Admitting: Gastroenterology

## 2019-08-12 ENCOUNTER — Other Ambulatory Visit: Payer: Self-pay

## 2019-08-12 ENCOUNTER — Ambulatory Visit: Payer: Medicare HMO | Admitting: Gastroenterology

## 2019-08-12 VITALS — BP 122/72 | HR 69 | Temp 98.8°F | Wt 92.5 lb

## 2019-08-12 DIAGNOSIS — K5730 Diverticulosis of large intestine without perforation or abscess without bleeding: Secondary | ICD-10-CM | POA: Diagnosis not present

## 2019-08-12 DIAGNOSIS — K581 Irritable bowel syndrome with constipation: Secondary | ICD-10-CM | POA: Diagnosis not present

## 2019-08-12 MED ORDER — ONDANSETRON 4 MG PO TBDP: 4.0000 mg | ORAL_TABLET | Freq: Three times a day (TID) | ORAL | 2 refills | Status: DC | PRN

## 2019-08-12 NOTE — Progress Notes (Signed)
Chief Complaint: Lower abdominal pain  Referring Provider: Dr. Regino Schultze      ASSESSMENT AND PLAN;   #1.  IBS with constipation, has diarrhea when she eats sweet foods.  #2.  GERD.  #3. Lower abdominal pain (better with defecation)  Plan: - Rabeprazole 20 mg po qd. Can take it PRN. - Zofran 4mg  ODT #20, 2 refiils - She does not want another colonoscopy or CT. I do agree. HPI:    Heather Thomas is a 85 y.o. female  For follow-up visit Diarrhea is better - happens when she eats sweets (donuts)  History of chronic constipation in past. Used to have diarrhea after eating 3 reason oatmeal cookies  Lower abdominal pain which gets better with defecation No melena or hematochezia.  No fever or chills.  Has lost weight - not working currently.  Lost all her family.  Has grandson who takes care of her.  Since she feels better, she wants to hold off on any CT.  Had attempted colonoscopy 07/2003 up to 35 cm, severe sigmoid diverticulosis. Past Medical History:  Diagnosis Date  . A-fib ([place])   . Acid reflux   . AF (paroxysmal atrial fibrillation) ([place])   . Anemia   . DJD (degenerative joint disease)   . Osteoporosis   . PMR (polymyalgia rheumatica) (HCC)     Past Surgical History:  Procedure Laterality Date  . ABDOMINAL HYSTERECTOMY    . COLONOSCOPY  07/09/2003   Colonic sigmoid diverticulosis. Otherwise normal attempted colonoscopy to 35cm.   . ESOPHAGOGASTRODUODENOSCOPY  10/15/2014   Erosive gastritis. Otherwise normal EGD,   . INSERT / REPLACE / REMOVE PACEMAKER     Medtronic    Family History  Problem Relation Age of Onset  . Diabetes Daughter   . Heart disease Daughter   . Cervical cancer Daughter   . Heart attack Mother   . Lung disease Son   . Colon cancer Neg Hx   . Esophageal cancer Neg Hx     Social History   Tobacco Use  . Smoking status: Never Smoker  . Smokeless tobacco: Never Used  Substance Use Topics  . Alcohol use: No   Frequency: Never  . Drug use: No    Current Outpatient Medications  Medication Sig Dispense Refill  . aspirin EC 81 MG tablet Take 1 tablet (81 mg total) by mouth daily. 90 tablet 3  . diphenoxylate-atropine (LOMOTIL) 2.5-0.025 MG tablet Take 1 tablet by mouth 2 (two) times daily as needed for diarrhea or loose stools. 60 tablet 1  . esomeprazole (NEXIUM) 40 MG capsule Take 1 capsule (40 mg total) by mouth daily at 12 noon. (Patient taking differently: Take 40 mg by mouth as needed. ) 30 capsule 0  . RABEprazole (ACIPHEX) 20 MG tablet Take 20 mg by mouth daily.    . sotalol (BETAPACE) 80 MG tablet TAKE 1/2 TABLET(40 MG) BY MOUTH TWICE DAILY 90 tablet 1  . nitroGLYCERIN (NITROSTAT) 0.4 MG SL tablet Place 1 tablet (0.4 mg total) under the tongue every 5 (five) minutes as needed for chest pain. 90 tablet 3   No current facility-administered medications for this visit.     Allergies  Allergen Reactions  . Codeine Nausea Only    Review of Systems:  Negative except for HPI     Physical Exam:    BP 122/72   Pulse 69   Temp 98.8 F (37.1 C)   Wt 92 lb 8 oz (42 kg)  BMI 18.07 kg/m  Filed Weights   08/12/19 1134  Weight: 92 lb 8 oz (42 kg)   Constitutional:  Well-developed, in no acute distress.  Frail female, uses walker Psychiatric: Normal mood and affect. Behavior is normal. HEENT: Pupils normal.  Conjunctivae are normal. No scleral icterus. Cardiovascular: Normal rate, regular rhythm. No edema Pulmonary/chest: Effort normal and breath sounds normal. No wheezing, rales or rhonchi. Abdominal: Soft, nondistended. Nontender. Bowel sounds active throughout. There are no masses palpable. No hepatomegaly. Rectal:  defered Neurological: Alert and oriented to person place and time. Has significant kyphosis. Skin: Skin is warm and dry. No rashes noted. I spent 15 minutes of face-to-face time with the patient. Greater than 50% of the time was spent counseling and coordinating care.  I  also discussed above with grandson.    Edman Circle, MD 08/12/2019, 11:46 AM  Cc: Wilmer Floor., MD

## 2019-08-12 NOTE — Patient Instructions (Signed)
If you are age 83 or older, your body mass index should be between 23-30. Your Body mass index is 18.07 kg/m. If this is out of the aforementioned range listed, please consider follow up with your Primary Care Provider.  If you are age 29 or younger, your body mass index should be between 19-25. Your Body mass index is 18.07 kg/m. If this is out of the aformentioned range listed, please consider follow up with your Primary Care Provider.   We have sent the following medications to your pharmacy for you to pick up at your convenience: Zofran  You can use your Rabeprazole as needed.  Please call Dr. Leland Her nurse Karl Pock, RN)  in 2 weeks at [phone]  to let her now how you are doing.   Thank you,  Dr. Jackquline Denmark

## 2019-08-20 DIAGNOSIS — M25421 Effusion, right elbow: Secondary | ICD-10-CM | POA: Diagnosis not present

## 2019-08-20 DIAGNOSIS — M25562 Pain in left knee: Secondary | ICD-10-CM | POA: Diagnosis not present

## 2019-08-20 DIAGNOSIS — S80211A Abrasion, right knee, initial encounter: Secondary | ICD-10-CM | POA: Diagnosis not present

## 2019-08-20 DIAGNOSIS — S8991XA Unspecified injury of right lower leg, initial encounter: Secondary | ICD-10-CM | POA: Diagnosis not present

## 2019-08-20 DIAGNOSIS — S42401A Unspecified fracture of lower end of right humerus, initial encounter for closed fracture: Secondary | ICD-10-CM | POA: Diagnosis not present

## 2019-08-20 DIAGNOSIS — M25561 Pain in right knee: Secondary | ICD-10-CM | POA: Diagnosis not present

## 2019-08-20 DIAGNOSIS — S0990XA Unspecified injury of head, initial encounter: Secondary | ICD-10-CM | POA: Diagnosis not present

## 2019-08-20 DIAGNOSIS — S80212A Abrasion, left knee, initial encounter: Secondary | ICD-10-CM | POA: Diagnosis not present

## 2019-08-26 DIAGNOSIS — S5000XA Contusion of unspecified elbow, initial encounter: Secondary | ICD-10-CM | POA: Diagnosis not present

## 2019-08-29 DIAGNOSIS — Z6820 Body mass index (BMI) 20.0-20.9, adult: Secondary | ICD-10-CM | POA: Diagnosis not present

## 2019-08-29 DIAGNOSIS — Z79899 Other long term (current) drug therapy: Secondary | ICD-10-CM | POA: Diagnosis not present

## 2019-08-29 DIAGNOSIS — R296 Repeated falls: Secondary | ICD-10-CM | POA: Diagnosis not present

## 2019-08-29 DIAGNOSIS — S80211A Abrasion, right knee, initial encounter: Secondary | ICD-10-CM | POA: Diagnosis not present

## 2019-09-03 DIAGNOSIS — E7800 Pure hypercholesterolemia, unspecified: Secondary | ICD-10-CM | POA: Diagnosis not present

## 2019-09-03 DIAGNOSIS — S0083XD Contusion of other part of head, subsequent encounter: Secondary | ICD-10-CM | POA: Diagnosis not present

## 2019-09-03 DIAGNOSIS — S80211D Abrasion, right knee, subsequent encounter: Secondary | ICD-10-CM | POA: Diagnosis not present

## 2019-09-03 DIAGNOSIS — M8008XD Age-related osteoporosis with current pathological fracture, vertebra(e), subsequent encounter for fracture with routine healing: Secondary | ICD-10-CM | POA: Diagnosis not present

## 2019-09-03 DIAGNOSIS — J439 Emphysema, unspecified: Secondary | ICD-10-CM | POA: Diagnosis not present

## 2019-09-03 DIAGNOSIS — N289 Disorder of kidney and ureter, unspecified: Secondary | ICD-10-CM | POA: Diagnosis not present

## 2019-09-03 DIAGNOSIS — E559 Vitamin D deficiency, unspecified: Secondary | ICD-10-CM | POA: Diagnosis not present

## 2019-09-03 DIAGNOSIS — I495 Sick sinus syndrome: Secondary | ICD-10-CM | POA: Diagnosis not present

## 2019-09-03 DIAGNOSIS — M419 Scoliosis, unspecified: Secondary | ICD-10-CM | POA: Diagnosis not present

## 2019-09-03 DIAGNOSIS — I4891 Unspecified atrial fibrillation: Secondary | ICD-10-CM | POA: Diagnosis not present

## 2019-09-03 DIAGNOSIS — F329 Major depressive disorder, single episode, unspecified: Secondary | ICD-10-CM | POA: Diagnosis not present

## 2019-09-03 DIAGNOSIS — I110 Hypertensive heart disease with heart failure: Secondary | ICD-10-CM | POA: Diagnosis not present

## 2019-09-03 DIAGNOSIS — E46 Unspecified protein-calorie malnutrition: Secondary | ICD-10-CM | POA: Diagnosis not present

## 2019-09-03 DIAGNOSIS — Z7982 Long term (current) use of aspirin: Secondary | ICD-10-CM | POA: Diagnosis not present

## 2019-09-03 DIAGNOSIS — Z950 Presence of cardiac pacemaker: Secondary | ICD-10-CM | POA: Diagnosis not present

## 2019-09-03 DIAGNOSIS — I509 Heart failure, unspecified: Secondary | ICD-10-CM | POA: Diagnosis not present

## 2019-09-03 DIAGNOSIS — M80821D Other osteoporosis with current pathological fracture, right humerus, subsequent encounter for fracture with routine healing: Secondary | ICD-10-CM | POA: Diagnosis not present

## 2019-09-03 DIAGNOSIS — Z87440 Personal history of urinary (tract) infections: Secondary | ICD-10-CM | POA: Diagnosis not present

## 2019-09-03 DIAGNOSIS — K219 Gastro-esophageal reflux disease without esophagitis: Secondary | ICD-10-CM | POA: Diagnosis not present

## 2019-09-03 DIAGNOSIS — S80212D Abrasion, left knee, subsequent encounter: Secondary | ICD-10-CM | POA: Diagnosis not present

## 2019-09-03 DIAGNOSIS — M159 Polyosteoarthritis, unspecified: Secondary | ICD-10-CM | POA: Diagnosis not present

## 2019-09-03 DIAGNOSIS — W19XXXD Unspecified fall, subsequent encounter: Secondary | ICD-10-CM | POA: Diagnosis not present

## 2019-09-03 DIAGNOSIS — F419 Anxiety disorder, unspecified: Secondary | ICD-10-CM | POA: Diagnosis not present

## 2019-09-03 DIAGNOSIS — E785 Hyperlipidemia, unspecified: Secondary | ICD-10-CM | POA: Diagnosis not present

## 2019-09-05 DIAGNOSIS — W19XXXD Unspecified fall, subsequent encounter: Secondary | ICD-10-CM | POA: Diagnosis not present

## 2019-09-05 DIAGNOSIS — I509 Heart failure, unspecified: Secondary | ICD-10-CM | POA: Diagnosis not present

## 2019-09-05 DIAGNOSIS — M159 Polyosteoarthritis, unspecified: Secondary | ICD-10-CM | POA: Diagnosis not present

## 2019-09-05 DIAGNOSIS — Z950 Presence of cardiac pacemaker: Secondary | ICD-10-CM | POA: Diagnosis not present

## 2019-09-05 DIAGNOSIS — E46 Unspecified protein-calorie malnutrition: Secondary | ICD-10-CM | POA: Diagnosis not present

## 2019-09-05 DIAGNOSIS — I4891 Unspecified atrial fibrillation: Secondary | ICD-10-CM | POA: Diagnosis not present

## 2019-09-05 DIAGNOSIS — Z7982 Long term (current) use of aspirin: Secondary | ICD-10-CM | POA: Diagnosis not present

## 2019-09-05 DIAGNOSIS — I110 Hypertensive heart disease with heart failure: Secondary | ICD-10-CM | POA: Diagnosis not present

## 2019-09-05 DIAGNOSIS — K219 Gastro-esophageal reflux disease without esophagitis: Secondary | ICD-10-CM | POA: Diagnosis not present

## 2019-09-06 DIAGNOSIS — I4891 Unspecified atrial fibrillation: Secondary | ICD-10-CM | POA: Diagnosis not present

## 2019-09-06 DIAGNOSIS — W19XXXD Unspecified fall, subsequent encounter: Secondary | ICD-10-CM | POA: Diagnosis not present

## 2019-09-06 DIAGNOSIS — Z7982 Long term (current) use of aspirin: Secondary | ICD-10-CM | POA: Diagnosis not present

## 2019-09-06 DIAGNOSIS — I110 Hypertensive heart disease with heart failure: Secondary | ICD-10-CM | POA: Diagnosis not present

## 2019-09-06 DIAGNOSIS — Z950 Presence of cardiac pacemaker: Secondary | ICD-10-CM | POA: Diagnosis not present

## 2019-09-06 DIAGNOSIS — I509 Heart failure, unspecified: Secondary | ICD-10-CM | POA: Diagnosis not present

## 2019-09-06 DIAGNOSIS — K219 Gastro-esophageal reflux disease without esophagitis: Secondary | ICD-10-CM | POA: Diagnosis not present

## 2019-09-06 DIAGNOSIS — M159 Polyosteoarthritis, unspecified: Secondary | ICD-10-CM | POA: Diagnosis not present

## 2019-09-06 DIAGNOSIS — E46 Unspecified protein-calorie malnutrition: Secondary | ICD-10-CM | POA: Diagnosis not present

## 2019-09-09 DIAGNOSIS — M159 Polyosteoarthritis, unspecified: Secondary | ICD-10-CM | POA: Diagnosis not present

## 2019-09-09 DIAGNOSIS — I509 Heart failure, unspecified: Secondary | ICD-10-CM | POA: Diagnosis not present

## 2019-09-09 DIAGNOSIS — K219 Gastro-esophageal reflux disease without esophagitis: Secondary | ICD-10-CM | POA: Diagnosis not present

## 2019-09-09 DIAGNOSIS — W19XXXD Unspecified fall, subsequent encounter: Secondary | ICD-10-CM | POA: Diagnosis not present

## 2019-09-09 DIAGNOSIS — Z950 Presence of cardiac pacemaker: Secondary | ICD-10-CM | POA: Diagnosis not present

## 2019-09-09 DIAGNOSIS — I110 Hypertensive heart disease with heart failure: Secondary | ICD-10-CM | POA: Diagnosis not present

## 2019-09-09 DIAGNOSIS — E46 Unspecified protein-calorie malnutrition: Secondary | ICD-10-CM | POA: Diagnosis not present

## 2019-09-09 DIAGNOSIS — Z7982 Long term (current) use of aspirin: Secondary | ICD-10-CM | POA: Diagnosis not present

## 2019-09-09 DIAGNOSIS — I4891 Unspecified atrial fibrillation: Secondary | ICD-10-CM | POA: Diagnosis not present

## 2019-09-10 DIAGNOSIS — Z7982 Long term (current) use of aspirin: Secondary | ICD-10-CM | POA: Diagnosis not present

## 2019-09-10 DIAGNOSIS — T148XXA Other injury of unspecified body region, initial encounter: Secondary | ICD-10-CM | POA: Diagnosis not present

## 2019-09-10 DIAGNOSIS — I4891 Unspecified atrial fibrillation: Secondary | ICD-10-CM | POA: Diagnosis not present

## 2019-09-10 DIAGNOSIS — M79662 Pain in left lower leg: Secondary | ICD-10-CM | POA: Diagnosis not present

## 2019-09-10 DIAGNOSIS — Z950 Presence of cardiac pacemaker: Secondary | ICD-10-CM | POA: Diagnosis not present

## 2019-09-10 DIAGNOSIS — E46 Unspecified protein-calorie malnutrition: Secondary | ICD-10-CM | POA: Diagnosis not present

## 2019-09-10 DIAGNOSIS — S80812A Abrasion, left lower leg, initial encounter: Secondary | ICD-10-CM | POA: Diagnosis not present

## 2019-09-10 DIAGNOSIS — S8992XA Unspecified injury of left lower leg, initial encounter: Secondary | ICD-10-CM | POA: Diagnosis not present

## 2019-09-10 DIAGNOSIS — M159 Polyosteoarthritis, unspecified: Secondary | ICD-10-CM | POA: Diagnosis not present

## 2019-09-10 DIAGNOSIS — I110 Hypertensive heart disease with heart failure: Secondary | ICD-10-CM | POA: Diagnosis not present

## 2019-09-10 DIAGNOSIS — K219 Gastro-esophageal reflux disease without esophagitis: Secondary | ICD-10-CM | POA: Diagnosis not present

## 2019-09-10 DIAGNOSIS — L03116 Cellulitis of left lower limb: Secondary | ICD-10-CM | POA: Diagnosis not present

## 2019-09-10 DIAGNOSIS — I509 Heart failure, unspecified: Secondary | ICD-10-CM | POA: Diagnosis not present

## 2019-09-10 DIAGNOSIS — W19XXXD Unspecified fall, subsequent encounter: Secondary | ICD-10-CM | POA: Diagnosis not present

## 2019-09-10 DIAGNOSIS — M7989 Other specified soft tissue disorders: Secondary | ICD-10-CM | POA: Diagnosis not present

## 2019-09-12 DIAGNOSIS — I110 Hypertensive heart disease with heart failure: Secondary | ICD-10-CM | POA: Diagnosis not present

## 2019-09-12 DIAGNOSIS — Z950 Presence of cardiac pacemaker: Secondary | ICD-10-CM | POA: Diagnosis not present

## 2019-09-12 DIAGNOSIS — E46 Unspecified protein-calorie malnutrition: Secondary | ICD-10-CM | POA: Diagnosis not present

## 2019-09-12 DIAGNOSIS — I4891 Unspecified atrial fibrillation: Secondary | ICD-10-CM | POA: Diagnosis not present

## 2019-09-12 DIAGNOSIS — I509 Heart failure, unspecified: Secondary | ICD-10-CM | POA: Diagnosis not present

## 2019-09-12 DIAGNOSIS — W19XXXD Unspecified fall, subsequent encounter: Secondary | ICD-10-CM | POA: Diagnosis not present

## 2019-09-12 DIAGNOSIS — K219 Gastro-esophageal reflux disease without esophagitis: Secondary | ICD-10-CM | POA: Diagnosis not present

## 2019-09-12 DIAGNOSIS — M159 Polyosteoarthritis, unspecified: Secondary | ICD-10-CM | POA: Diagnosis not present

## 2019-09-12 DIAGNOSIS — Z7982 Long term (current) use of aspirin: Secondary | ICD-10-CM | POA: Diagnosis not present

## 2019-09-15 DIAGNOSIS — I4891 Unspecified atrial fibrillation: Secondary | ICD-10-CM | POA: Diagnosis not present

## 2019-09-15 DIAGNOSIS — Z950 Presence of cardiac pacemaker: Secondary | ICD-10-CM | POA: Diagnosis not present

## 2019-09-15 DIAGNOSIS — Z7982 Long term (current) use of aspirin: Secondary | ICD-10-CM | POA: Diagnosis not present

## 2019-09-15 DIAGNOSIS — W19XXXD Unspecified fall, subsequent encounter: Secondary | ICD-10-CM | POA: Diagnosis not present

## 2019-09-15 DIAGNOSIS — K219 Gastro-esophageal reflux disease without esophagitis: Secondary | ICD-10-CM | POA: Diagnosis not present

## 2019-09-15 DIAGNOSIS — E46 Unspecified protein-calorie malnutrition: Secondary | ICD-10-CM | POA: Diagnosis not present

## 2019-09-15 DIAGNOSIS — I110 Hypertensive heart disease with heart failure: Secondary | ICD-10-CM | POA: Diagnosis not present

## 2019-09-15 DIAGNOSIS — I509 Heart failure, unspecified: Secondary | ICD-10-CM | POA: Diagnosis not present

## 2019-09-15 DIAGNOSIS — M159 Polyosteoarthritis, unspecified: Secondary | ICD-10-CM | POA: Diagnosis not present

## 2019-09-16 DIAGNOSIS — L02415 Cutaneous abscess of right lower limb: Secondary | ICD-10-CM | POA: Diagnosis not present

## 2019-09-17 DIAGNOSIS — I110 Hypertensive heart disease with heart failure: Secondary | ICD-10-CM | POA: Diagnosis not present

## 2019-09-17 DIAGNOSIS — Z950 Presence of cardiac pacemaker: Secondary | ICD-10-CM | POA: Diagnosis not present

## 2019-09-17 DIAGNOSIS — I509 Heart failure, unspecified: Secondary | ICD-10-CM | POA: Diagnosis not present

## 2019-09-17 DIAGNOSIS — E46 Unspecified protein-calorie malnutrition: Secondary | ICD-10-CM | POA: Diagnosis not present

## 2019-09-17 DIAGNOSIS — Z7982 Long term (current) use of aspirin: Secondary | ICD-10-CM | POA: Diagnosis not present

## 2019-09-17 DIAGNOSIS — W19XXXD Unspecified fall, subsequent encounter: Secondary | ICD-10-CM | POA: Diagnosis not present

## 2019-09-17 DIAGNOSIS — M159 Polyosteoarthritis, unspecified: Secondary | ICD-10-CM | POA: Diagnosis not present

## 2019-09-17 DIAGNOSIS — K219 Gastro-esophageal reflux disease without esophagitis: Secondary | ICD-10-CM | POA: Diagnosis not present

## 2019-09-17 DIAGNOSIS — I4891 Unspecified atrial fibrillation: Secondary | ICD-10-CM | POA: Diagnosis not present

## 2019-09-18 DIAGNOSIS — W19XXXD Unspecified fall, subsequent encounter: Secondary | ICD-10-CM | POA: Diagnosis not present

## 2019-09-18 DIAGNOSIS — Z950 Presence of cardiac pacemaker: Secondary | ICD-10-CM | POA: Diagnosis not present

## 2019-09-18 DIAGNOSIS — M159 Polyosteoarthritis, unspecified: Secondary | ICD-10-CM | POA: Diagnosis not present

## 2019-09-18 DIAGNOSIS — I4891 Unspecified atrial fibrillation: Secondary | ICD-10-CM | POA: Diagnosis not present

## 2019-09-18 DIAGNOSIS — I110 Hypertensive heart disease with heart failure: Secondary | ICD-10-CM | POA: Diagnosis not present

## 2019-09-18 DIAGNOSIS — E46 Unspecified protein-calorie malnutrition: Secondary | ICD-10-CM | POA: Diagnosis not present

## 2019-09-18 DIAGNOSIS — I509 Heart failure, unspecified: Secondary | ICD-10-CM | POA: Diagnosis not present

## 2019-09-18 DIAGNOSIS — K219 Gastro-esophageal reflux disease without esophagitis: Secondary | ICD-10-CM | POA: Diagnosis not present

## 2019-09-18 DIAGNOSIS — M80821D Other osteoporosis with current pathological fracture, right humerus, subsequent encounter for fracture with routine healing: Secondary | ICD-10-CM | POA: Diagnosis not present

## 2019-09-18 DIAGNOSIS — Z7982 Long term (current) use of aspirin: Secondary | ICD-10-CM | POA: Diagnosis not present

## 2019-09-19 DIAGNOSIS — M159 Polyosteoarthritis, unspecified: Secondary | ICD-10-CM | POA: Diagnosis not present

## 2019-09-19 DIAGNOSIS — K219 Gastro-esophageal reflux disease without esophagitis: Secondary | ICD-10-CM | POA: Diagnosis not present

## 2019-09-19 DIAGNOSIS — W19XXXD Unspecified fall, subsequent encounter: Secondary | ICD-10-CM | POA: Diagnosis not present

## 2019-09-19 DIAGNOSIS — I110 Hypertensive heart disease with heart failure: Secondary | ICD-10-CM | POA: Diagnosis not present

## 2019-09-19 DIAGNOSIS — I509 Heart failure, unspecified: Secondary | ICD-10-CM | POA: Diagnosis not present

## 2019-09-19 DIAGNOSIS — Z950 Presence of cardiac pacemaker: Secondary | ICD-10-CM | POA: Diagnosis not present

## 2019-09-19 DIAGNOSIS — Z7982 Long term (current) use of aspirin: Secondary | ICD-10-CM | POA: Diagnosis not present

## 2019-09-19 DIAGNOSIS — I4891 Unspecified atrial fibrillation: Secondary | ICD-10-CM | POA: Diagnosis not present

## 2019-09-19 DIAGNOSIS — E46 Unspecified protein-calorie malnutrition: Secondary | ICD-10-CM | POA: Diagnosis not present

## 2019-09-22 ENCOUNTER — Ambulatory Visit: Payer: Medicare HMO | Admitting: Cardiology

## 2019-09-22 DIAGNOSIS — I509 Heart failure, unspecified: Secondary | ICD-10-CM | POA: Diagnosis not present

## 2019-09-22 DIAGNOSIS — I110 Hypertensive heart disease with heart failure: Secondary | ICD-10-CM | POA: Diagnosis not present

## 2019-09-22 DIAGNOSIS — M159 Polyosteoarthritis, unspecified: Secondary | ICD-10-CM | POA: Diagnosis not present

## 2019-09-22 DIAGNOSIS — K219 Gastro-esophageal reflux disease without esophagitis: Secondary | ICD-10-CM | POA: Diagnosis not present

## 2019-09-22 DIAGNOSIS — I4891 Unspecified atrial fibrillation: Secondary | ICD-10-CM | POA: Diagnosis not present

## 2019-09-22 DIAGNOSIS — Z950 Presence of cardiac pacemaker: Secondary | ICD-10-CM | POA: Diagnosis not present

## 2019-09-22 DIAGNOSIS — Z7982 Long term (current) use of aspirin: Secondary | ICD-10-CM | POA: Diagnosis not present

## 2019-09-22 DIAGNOSIS — W19XXXD Unspecified fall, subsequent encounter: Secondary | ICD-10-CM | POA: Diagnosis not present

## 2019-09-22 DIAGNOSIS — E46 Unspecified protein-calorie malnutrition: Secondary | ICD-10-CM | POA: Diagnosis not present

## 2019-09-23 DIAGNOSIS — Z950 Presence of cardiac pacemaker: Secondary | ICD-10-CM | POA: Diagnosis not present

## 2019-09-23 DIAGNOSIS — I110 Hypertensive heart disease with heart failure: Secondary | ICD-10-CM | POA: Diagnosis not present

## 2019-09-23 DIAGNOSIS — I509 Heart failure, unspecified: Secondary | ICD-10-CM | POA: Diagnosis not present

## 2019-09-23 DIAGNOSIS — K219 Gastro-esophageal reflux disease without esophagitis: Secondary | ICD-10-CM | POA: Diagnosis not present

## 2019-09-23 DIAGNOSIS — L84 Corns and callosities: Secondary | ICD-10-CM | POA: Diagnosis not present

## 2019-09-23 DIAGNOSIS — G629 Polyneuropathy, unspecified: Secondary | ICD-10-CM | POA: Diagnosis not present

## 2019-09-23 DIAGNOSIS — Z7982 Long term (current) use of aspirin: Secondary | ICD-10-CM | POA: Diagnosis not present

## 2019-09-23 DIAGNOSIS — L602 Onychogryphosis: Secondary | ICD-10-CM | POA: Diagnosis not present

## 2019-09-23 DIAGNOSIS — E46 Unspecified protein-calorie malnutrition: Secondary | ICD-10-CM | POA: Diagnosis not present

## 2019-09-23 DIAGNOSIS — I4891 Unspecified atrial fibrillation: Secondary | ICD-10-CM | POA: Diagnosis not present

## 2019-09-23 DIAGNOSIS — M159 Polyosteoarthritis, unspecified: Secondary | ICD-10-CM | POA: Diagnosis not present

## 2019-09-23 DIAGNOSIS — Z6820 Body mass index (BMI) 20.0-20.9, adult: Secondary | ICD-10-CM | POA: Diagnosis not present

## 2019-09-23 DIAGNOSIS — W19XXXD Unspecified fall, subsequent encounter: Secondary | ICD-10-CM | POA: Diagnosis not present

## 2019-09-24 ENCOUNTER — Ambulatory Visit: Payer: Medicare HMO | Admitting: Cardiology

## 2019-09-24 DIAGNOSIS — I509 Heart failure, unspecified: Secondary | ICD-10-CM | POA: Diagnosis not present

## 2019-09-24 DIAGNOSIS — I110 Hypertensive heart disease with heart failure: Secondary | ICD-10-CM | POA: Diagnosis not present

## 2019-09-24 DIAGNOSIS — M159 Polyosteoarthritis, unspecified: Secondary | ICD-10-CM | POA: Diagnosis not present

## 2019-09-24 DIAGNOSIS — E46 Unspecified protein-calorie malnutrition: Secondary | ICD-10-CM | POA: Diagnosis not present

## 2019-09-24 DIAGNOSIS — I4891 Unspecified atrial fibrillation: Secondary | ICD-10-CM | POA: Diagnosis not present

## 2019-09-24 DIAGNOSIS — Z950 Presence of cardiac pacemaker: Secondary | ICD-10-CM | POA: Diagnosis not present

## 2019-09-24 DIAGNOSIS — Z7982 Long term (current) use of aspirin: Secondary | ICD-10-CM | POA: Diagnosis not present

## 2019-09-24 DIAGNOSIS — K219 Gastro-esophageal reflux disease without esophagitis: Secondary | ICD-10-CM | POA: Diagnosis not present

## 2019-09-24 DIAGNOSIS — W19XXXD Unspecified fall, subsequent encounter: Secondary | ICD-10-CM | POA: Diagnosis not present

## 2019-09-25 DIAGNOSIS — W19XXXD Unspecified fall, subsequent encounter: Secondary | ICD-10-CM | POA: Diagnosis not present

## 2019-09-25 DIAGNOSIS — I110 Hypertensive heart disease with heart failure: Secondary | ICD-10-CM | POA: Diagnosis not present

## 2019-09-25 DIAGNOSIS — K219 Gastro-esophageal reflux disease without esophagitis: Secondary | ICD-10-CM | POA: Diagnosis not present

## 2019-09-25 DIAGNOSIS — Z950 Presence of cardiac pacemaker: Secondary | ICD-10-CM | POA: Diagnosis not present

## 2019-09-25 DIAGNOSIS — M159 Polyosteoarthritis, unspecified: Secondary | ICD-10-CM | POA: Diagnosis not present

## 2019-09-25 DIAGNOSIS — Z7982 Long term (current) use of aspirin: Secondary | ICD-10-CM | POA: Diagnosis not present

## 2019-09-25 DIAGNOSIS — E46 Unspecified protein-calorie malnutrition: Secondary | ICD-10-CM | POA: Diagnosis not present

## 2019-09-25 DIAGNOSIS — I509 Heart failure, unspecified: Secondary | ICD-10-CM | POA: Diagnosis not present

## 2019-09-25 DIAGNOSIS — I4891 Unspecified atrial fibrillation: Secondary | ICD-10-CM | POA: Diagnosis not present

## 2019-09-29 DIAGNOSIS — W19XXXD Unspecified fall, subsequent encounter: Secondary | ICD-10-CM | POA: Diagnosis not present

## 2019-09-29 DIAGNOSIS — M159 Polyosteoarthritis, unspecified: Secondary | ICD-10-CM | POA: Diagnosis not present

## 2019-09-29 DIAGNOSIS — I110 Hypertensive heart disease with heart failure: Secondary | ICD-10-CM | POA: Diagnosis not present

## 2019-09-29 DIAGNOSIS — Z950 Presence of cardiac pacemaker: Secondary | ICD-10-CM | POA: Diagnosis not present

## 2019-09-29 DIAGNOSIS — I509 Heart failure, unspecified: Secondary | ICD-10-CM | POA: Diagnosis not present

## 2019-09-29 DIAGNOSIS — I4891 Unspecified atrial fibrillation: Secondary | ICD-10-CM | POA: Diagnosis not present

## 2019-09-29 DIAGNOSIS — K219 Gastro-esophageal reflux disease without esophagitis: Secondary | ICD-10-CM | POA: Diagnosis not present

## 2019-09-29 DIAGNOSIS — E46 Unspecified protein-calorie malnutrition: Secondary | ICD-10-CM | POA: Diagnosis not present

## 2019-09-29 DIAGNOSIS — Z7982 Long term (current) use of aspirin: Secondary | ICD-10-CM | POA: Diagnosis not present

## 2019-09-30 ENCOUNTER — Telehealth: Payer: Self-pay

## 2019-09-30 NOTE — Telephone Encounter (Signed)
The pt states she do not know how to use her monitor. I was going to try to talk her thru sending the transmission but she prefer someone at the office to show her. She is taking it to her appointment with Dr. Raliegh Ip.

## 2019-10-07 DIAGNOSIS — L84 Corns and callosities: Secondary | ICD-10-CM

## 2019-10-07 DIAGNOSIS — L919 Hypertrophic disorder of the skin, unspecified: Secondary | ICD-10-CM

## 2019-10-07 DIAGNOSIS — M79675 Pain in left toe(s): Secondary | ICD-10-CM | POA: Diagnosis not present

## 2019-10-07 DIAGNOSIS — M216X1 Other acquired deformities of right foot: Secondary | ICD-10-CM | POA: Diagnosis not present

## 2019-10-07 DIAGNOSIS — B351 Tinea unguium: Secondary | ICD-10-CM

## 2019-10-07 DIAGNOSIS — M216X2 Other acquired deformities of left foot: Secondary | ICD-10-CM | POA: Diagnosis not present

## 2019-10-07 DIAGNOSIS — M79674 Pain in right toe(s): Secondary | ICD-10-CM

## 2019-10-07 DIAGNOSIS — L600 Ingrowing nail: Secondary | ICD-10-CM

## 2019-10-07 DIAGNOSIS — L909 Atrophic disorder of skin, unspecified: Secondary | ICD-10-CM

## 2019-10-07 HISTORY — DX: Corns and callosities: L84

## 2019-10-07 HISTORY — DX: Atrophic disorder of skin, unspecified: L90.9

## 2019-10-07 HISTORY — DX: Hypertrophic disorder of the skin, unspecified: L91.9

## 2019-10-07 HISTORY — DX: Ingrowing nail: L60.0

## 2019-10-07 HISTORY — DX: Tinea unguium: B35.1

## 2019-10-07 HISTORY — DX: Tinea unguium: M79.674

## 2019-10-09 DIAGNOSIS — S5001XA Contusion of right elbow, initial encounter: Secondary | ICD-10-CM | POA: Diagnosis not present

## 2019-10-13 DIAGNOSIS — M810 Age-related osteoporosis without current pathological fracture: Secondary | ICD-10-CM | POA: Diagnosis not present

## 2019-10-13 DIAGNOSIS — I4891 Unspecified atrial fibrillation: Secondary | ICD-10-CM | POA: Diagnosis not present

## 2019-10-13 DIAGNOSIS — N289 Disorder of kidney and ureter, unspecified: Secondary | ICD-10-CM | POA: Diagnosis not present

## 2019-10-13 DIAGNOSIS — K2950 Unspecified chronic gastritis without bleeding: Secondary | ICD-10-CM | POA: Diagnosis not present

## 2019-10-13 DIAGNOSIS — I495 Sick sinus syndrome: Secondary | ICD-10-CM | POA: Diagnosis not present

## 2019-10-13 DIAGNOSIS — K219 Gastro-esophageal reflux disease without esophagitis: Secondary | ICD-10-CM | POA: Diagnosis not present

## 2019-10-13 DIAGNOSIS — F411 Generalized anxiety disorder: Secondary | ICD-10-CM | POA: Diagnosis not present

## 2019-10-13 DIAGNOSIS — L602 Onychogryphosis: Secondary | ICD-10-CM | POA: Diagnosis not present

## 2019-10-13 DIAGNOSIS — Z79899 Other long term (current) drug therapy: Secondary | ICD-10-CM | POA: Diagnosis not present

## 2019-10-14 ENCOUNTER — Ambulatory Visit: Payer: Medicare HMO | Admitting: Cardiology

## 2019-10-14 DIAGNOSIS — Z950 Presence of cardiac pacemaker: Secondary | ICD-10-CM | POA: Diagnosis not present

## 2019-10-14 DIAGNOSIS — E46 Unspecified protein-calorie malnutrition: Secondary | ICD-10-CM | POA: Diagnosis not present

## 2019-10-14 DIAGNOSIS — W19XXXD Unspecified fall, subsequent encounter: Secondary | ICD-10-CM | POA: Diagnosis not present

## 2019-10-14 DIAGNOSIS — I509 Heart failure, unspecified: Secondary | ICD-10-CM | POA: Diagnosis not present

## 2019-10-14 DIAGNOSIS — I110 Hypertensive heart disease with heart failure: Secondary | ICD-10-CM | POA: Diagnosis not present

## 2019-10-14 DIAGNOSIS — M159 Polyosteoarthritis, unspecified: Secondary | ICD-10-CM | POA: Diagnosis not present

## 2019-10-14 DIAGNOSIS — I4891 Unspecified atrial fibrillation: Secondary | ICD-10-CM | POA: Diagnosis not present

## 2019-10-14 DIAGNOSIS — Z7982 Long term (current) use of aspirin: Secondary | ICD-10-CM | POA: Diagnosis not present

## 2019-10-14 DIAGNOSIS — K219 Gastro-esophageal reflux disease without esophagitis: Secondary | ICD-10-CM | POA: Diagnosis not present

## 2019-10-15 ENCOUNTER — Other Ambulatory Visit: Payer: Self-pay

## 2019-10-16 ENCOUNTER — Encounter: Payer: Self-pay | Admitting: Cardiology

## 2019-10-16 ENCOUNTER — Other Ambulatory Visit: Payer: Self-pay

## 2019-10-16 ENCOUNTER — Ambulatory Visit (INDEPENDENT_AMBULATORY_CARE_PROVIDER_SITE_OTHER): Payer: Medicare HMO | Admitting: Cardiology

## 2019-10-16 VITALS — BP 130/60 | HR 71 | Ht 60.0 in | Wt 93.0 lb

## 2019-10-16 DIAGNOSIS — R0789 Other chest pain: Secondary | ICD-10-CM

## 2019-10-16 DIAGNOSIS — I480 Paroxysmal atrial fibrillation: Secondary | ICD-10-CM | POA: Diagnosis not present

## 2019-10-16 DIAGNOSIS — E7800 Pure hypercholesterolemia, unspecified: Secondary | ICD-10-CM

## 2019-10-16 DIAGNOSIS — Z950 Presence of cardiac pacemaker: Secondary | ICD-10-CM | POA: Diagnosis not present

## 2019-10-16 NOTE — Patient Instructions (Signed)

## 2019-10-16 NOTE — Progress Notes (Signed)
Cardiology Office Note:    Date:  10/16/2019   ID:  Heather Thomas, DOB 1926-10-13, MRN [id number]  PCP:  Wilmer Floor., MD  Cardiologist:  Gypsy Balsam, MD    Referring MD: Wilmer Floor., MD   Chief Complaint  Patient presents with  . Follow-up    3 Months    History of Present Illness:    Heather Thomas is a 86 y.o. female with paroxysmal atrial fibrillation, not anticoagulated because of fragility and history of GI problem.  Pacemaker, dyslipidemia.  Comes today to my office for follow-up overall doing well she did have some stomach issues she did see stomach doctor.  Things are getting better from that aspect she is very careful about how she eats and what she needs and that seems to be helping.  Denies have any chest pain tightness squeezing pressure burning chest.  Past Medical History:  Diagnosis Date  . A-fib (HCC)   . Acid reflux   . AF (paroxysmal atrial fibrillation) (HCC)   . Anemia   . DJD (degenerative joint disease)   . Osteoporosis   . PMR (polymyalgia rheumatica) (HCC)     Past Surgical History:  Procedure Laterality Date  . ABDOMINAL HYSTERECTOMY    . COLONOSCOPY  07/09/2003   Colonic sigmoid diverticulosis. Otherwise normal attempted colonoscopy to 35cm.   . ESOPHAGOGASTRODUODENOSCOPY  10/15/2014   Erosive gastritis. Otherwise normal EGD,   . INSERT / REPLACE / REMOVE PACEMAKER     Medtronic    Current Medications: Current Meds  Medication Sig  . aspirin EC 81 MG tablet Take 1 tablet (81 mg total) by mouth daily.  . diphenoxylate-atropine (LOMOTIL) 2.5-0.025 MG tablet Take 1 tablet by mouth 2 (two) times daily as needed for diarrhea or loose stools.  Marland Kitchen esomeprazole (NEXIUM) 40 MG capsule Take 1 capsule (40 mg total) by mouth daily at 12 noon. (Patient taking differently: Take 40 mg by mouth as needed. )  . gabapentin (NEURONTIN) 100 MG capsule   . ondansetron (ZOFRAN ODT) 4 MG disintegrating tablet Take 1 tablet (4 mg total) by mouth  every 8 (eight) hours as needed for nausea or vomiting.  . RABEprazole (ACIPHEX) 20 MG tablet Take 20 mg by mouth daily.  . sotalol (BETAPACE) 80 MG tablet TAKE 1/2 TABLET(40 MG) BY MOUTH TWICE DAILY  . TOVIAZ 4 MG TB24 tablet      Allergies:   Codeine   Social History   Socioeconomic History  . Marital status: Married    Spouse name: Not on file  . Number of children: 3  . Years of education: Not on file  . Highest education level: Not on file  Occupational History  . Not on file  Tobacco Use  . Smoking status: Never Smoker  . Smokeless tobacco: Never Used  Substance and Sexual Activity  . Alcohol use: No  . Drug use: No  . Sexual activity: Not on file  Other Topics Concern  . Not on file  Social History Narrative  . Not on file   Social Determinants of Health   Financial Resource Strain:   . Difficulty of Paying Living Expenses: Not on file  Food Insecurity:   . Worried About Programme researcher, broadcasting/film/video in the Last Year: Not on file  . Ran Out of Food in the Last Year: Not on file  Transportation Needs:   . Lack of Transportation (Medical): Not on file  . Lack of Transportation (Non-Medical): Not on file  Physical Activity:   . Days of Exercise per Week: Not on file  . Minutes of Exercise per Session: Not on file  Stress:   . Feeling of Stress : Not on file  Social Connections:   . Frequency of Communication with Friends and Family: Not on file  . Frequency of Social Gatherings with Friends and Family: Not on file  . Attends Religious Services: Not on file  . Active Member of Clubs or Organizations: Not on file  . Attends Archivist Meetings: Not on file  . Marital Status: Not on file     Family History: The patient's family history includes Cervical cancer in her daughter; Diabetes in her daughter; Heart attack in her mother; Heart disease in her daughter; Lung disease in her son. There is no history of Colon cancer or Esophageal cancer. ROS:   Please see  the history of present illness.    All 14 point review of systems negative except as described per history of present illness  EKGs/Labs/Other Studies Reviewed:      Recent Labs: No results found for requested labs within last 8760 hours.  Recent Lipid Panel No results found for: CHOL, TRIG, HDL, CHOLHDL, VLDL, LDLCALC, LDLDIRECT  Physical Exam:    VS:  BP 130/60   Pulse 71   Ht 5' (1.524 m)   Wt 93 lb (42.2 kg)   SpO2 93%   BMI 18.16 kg/m     Wt Readings from Last 3 Encounters:  10/16/19 93 lb (42.2 kg)  08/12/19 92 lb 8 oz (42 kg)  06/30/19 96 lb 12.8 oz (43.9 kg)     GEN:  Well nourished, well developed in no acute distress HEENT: Normal NECK: No JVD; No carotid bruits LYMPHATICS: No lymphadenopathy CARDIAC: RRR, no murmurs, no rubs, no gallops RESPIRATORY:  Clear to auscultation without rales, wheezing or rhonchi  ABDOMEN: Soft, non-tender, non-distended MUSCULOSKELETAL:  No edema; No deformity  SKIN: Warm and dry LOWER EXTREMITIES: no swelling NEUROLOGIC:  Alert and oriented x 3 PSYCHIATRIC:  Normal affect   ASSESSMENT:    1. PAF (paroxysmal atrial fibrillation) ([place])   2. Pacemaker   3. Hypercholesteremia   4. Atypical chest pain    PLAN:    In order of problems listed above:  1. Paroxysmal atrial fibrillation chads 2 vascular equals 3 but not anticoagulated because of fragility and history of GI issues.  She is walking with a walker and she recently sustained a fall injury to left knee as well as right elbow with likely no fracture.  We will continue present management with aspirin 2. Pacemaker present followed by our EP team.  Stable 3. Dyslipidemia.  Will call primary care physician to get fasting lipid profile 4.    Medication Adjustments/Labs and Tests Ordered: Current medicines are reviewed at length with the patient today.  Concerns regarding medicines are outlined above.  No orders of the defined types were placed in this  encounter.  Medication changes: No orders of the defined types were placed in this encounter.   Signed, Park Liter, MD, [facility] 10/16/2019 9:43 AM    [place]

## 2019-10-17 DIAGNOSIS — I110 Hypertensive heart disease with heart failure: Secondary | ICD-10-CM | POA: Diagnosis not present

## 2019-10-17 DIAGNOSIS — G4489 Other headache syndrome: Secondary | ICD-10-CM | POA: Diagnosis not present

## 2019-10-17 DIAGNOSIS — K219 Gastro-esophageal reflux disease without esophagitis: Secondary | ICD-10-CM | POA: Diagnosis not present

## 2019-10-17 DIAGNOSIS — R52 Pain, unspecified: Secondary | ICD-10-CM | POA: Diagnosis not present

## 2019-10-17 DIAGNOSIS — E7800 Pure hypercholesterolemia, unspecified: Secondary | ICD-10-CM | POA: Diagnosis not present

## 2019-10-17 DIAGNOSIS — Z950 Presence of cardiac pacemaker: Secondary | ICD-10-CM | POA: Diagnosis not present

## 2019-10-17 DIAGNOSIS — M159 Polyosteoarthritis, unspecified: Secondary | ICD-10-CM | POA: Diagnosis not present

## 2019-10-17 DIAGNOSIS — Z7982 Long term (current) use of aspirin: Secondary | ICD-10-CM | POA: Diagnosis not present

## 2019-10-17 DIAGNOSIS — R531 Weakness: Secondary | ICD-10-CM | POA: Diagnosis not present

## 2019-10-17 DIAGNOSIS — J439 Emphysema, unspecified: Secondary | ICD-10-CM | POA: Diagnosis not present

## 2019-10-17 DIAGNOSIS — W19XXXD Unspecified fall, subsequent encounter: Secondary | ICD-10-CM | POA: Diagnosis not present

## 2019-10-17 DIAGNOSIS — I509 Heart failure, unspecified: Secondary | ICD-10-CM | POA: Diagnosis not present

## 2019-10-17 DIAGNOSIS — E860 Dehydration: Secondary | ICD-10-CM | POA: Diagnosis not present

## 2019-10-17 DIAGNOSIS — R0902 Hypoxemia: Secondary | ICD-10-CM | POA: Diagnosis not present

## 2019-10-17 DIAGNOSIS — I4891 Unspecified atrial fibrillation: Secondary | ICD-10-CM | POA: Diagnosis not present

## 2019-10-17 DIAGNOSIS — R1084 Generalized abdominal pain: Secondary | ICD-10-CM | POA: Diagnosis not present

## 2019-10-17 DIAGNOSIS — E46 Unspecified protein-calorie malnutrition: Secondary | ICD-10-CM | POA: Diagnosis not present

## 2019-10-21 DIAGNOSIS — M216X2 Other acquired deformities of left foot: Secondary | ICD-10-CM | POA: Diagnosis not present

## 2019-10-21 DIAGNOSIS — M216X1 Other acquired deformities of right foot: Secondary | ICD-10-CM | POA: Diagnosis not present

## 2019-10-21 DIAGNOSIS — L84 Corns and callosities: Secondary | ICD-10-CM | POA: Diagnosis not present

## 2019-10-23 ENCOUNTER — Telehealth: Payer: Self-pay | Admitting: Gastroenterology

## 2019-10-24 NOTE — Telephone Encounter (Signed)
Called and spoke with patient-patient informed of MD recommendations; patient is agreeable with plan of care and will call back to the office should symptoms return; Patient verbalized understanding of information/instructions;  Patient was advised to call the office at [phone] if questions/concerns arise;

## 2019-10-24 NOTE — Telephone Encounter (Signed)
Called yesterday afternoon, it appears.  Diaper rash cream seems reasonable.  Call back if bleeding worsens.  Sounds quite minor at this time

## 2019-10-24 NOTE — Telephone Encounter (Signed)
DOD- Dr. Chales Abrahams pt- Called and spoke with patient-patient reports she is feeling much better since yesterday; however, she is still worried about the blood- "a really tiny amount" on her stool -she does not think she has a hx of hemorrhoids; -she "has wiped my bottom so much yesterday because of the diarrhea- that I think that is what caused the tiny amount of blood";   -reports she has diarrhea every time she drinks ensure; advised patient to drink ensure slowly and "split up the bottle of ensure throughout the day"; -denies fever/chills/rectal pain/rectal bleeding (today)/body aches -received her first COVID vaccine last week (but does not feel like this is related to the vaccine)  Please advise further actions (advised hemorrhoidal cream/diaper rash cream/increased fluids/baby wipes/Tucks wipes/monitor for continued symptoms)

## 2019-10-27 DIAGNOSIS — Z6820 Body mass index (BMI) 20.0-20.9, adult: Secondary | ICD-10-CM | POA: Diagnosis not present

## 2019-10-27 DIAGNOSIS — Z79899 Other long term (current) drug therapy: Secondary | ICD-10-CM | POA: Diagnosis not present

## 2019-10-27 DIAGNOSIS — E869 Volume depletion, unspecified: Secondary | ICD-10-CM | POA: Diagnosis not present

## 2019-10-28 DIAGNOSIS — M159 Polyosteoarthritis, unspecified: Secondary | ICD-10-CM | POA: Diagnosis not present

## 2019-10-28 DIAGNOSIS — Z7982 Long term (current) use of aspirin: Secondary | ICD-10-CM | POA: Diagnosis not present

## 2019-10-28 DIAGNOSIS — I110 Hypertensive heart disease with heart failure: Secondary | ICD-10-CM | POA: Diagnosis not present

## 2019-10-28 DIAGNOSIS — I4891 Unspecified atrial fibrillation: Secondary | ICD-10-CM | POA: Diagnosis not present

## 2019-10-28 DIAGNOSIS — Z950 Presence of cardiac pacemaker: Secondary | ICD-10-CM | POA: Diagnosis not present

## 2019-10-28 DIAGNOSIS — E46 Unspecified protein-calorie malnutrition: Secondary | ICD-10-CM | POA: Diagnosis not present

## 2019-10-28 DIAGNOSIS — W19XXXD Unspecified fall, subsequent encounter: Secondary | ICD-10-CM | POA: Diagnosis not present

## 2019-10-28 DIAGNOSIS — K219 Gastro-esophageal reflux disease without esophagitis: Secondary | ICD-10-CM | POA: Diagnosis not present

## 2019-10-28 DIAGNOSIS — I509 Heart failure, unspecified: Secondary | ICD-10-CM | POA: Diagnosis not present

## 2019-10-29 ENCOUNTER — Telehealth: Payer: Self-pay

## 2019-10-29 NOTE — Telephone Encounter (Signed)
I called the pt and she states she took the monitor to her appointment but they did not help her send the transmission. I asked was her grandson was there to help but he was not. The pt took my direct office number for her grandson to call me to send the transmission with the home remote monitor.

## 2019-11-07 ENCOUNTER — Ambulatory Visit: Payer: Medicare HMO | Admitting: Gastroenterology

## 2019-11-07 DIAGNOSIS — L989 Disorder of the skin and subcutaneous tissue, unspecified: Secondary | ICD-10-CM | POA: Diagnosis not present

## 2019-11-07 DIAGNOSIS — Z6820 Body mass index (BMI) 20.0-20.9, adult: Secondary | ICD-10-CM | POA: Diagnosis not present

## 2019-11-07 NOTE — Telephone Encounter (Signed)
The pt got the no show letter about not sending a transmission. She states it is an inconvenient for her to come to the [place] office. I asked [place] to schedule the pt in [place] with Dr. Elberta Fortis to get help with her monitor.

## 2019-11-10 DIAGNOSIS — Z139 Encounter for screening, unspecified: Secondary | ICD-10-CM | POA: Diagnosis not present

## 2019-11-10 DIAGNOSIS — Z6820 Body mass index (BMI) 20.0-20.9, adult: Secondary | ICD-10-CM | POA: Diagnosis not present

## 2019-11-11 ENCOUNTER — Other Ambulatory Visit: Payer: Self-pay

## 2019-11-11 ENCOUNTER — Telehealth (INDEPENDENT_AMBULATORY_CARE_PROVIDER_SITE_OTHER): Payer: Medicare HMO | Admitting: Gastroenterology

## 2019-11-11 DIAGNOSIS — K5730 Diverticulosis of large intestine without perforation or abscess without bleeding: Secondary | ICD-10-CM

## 2019-11-11 MED ORDER — RABEPRAZOLE SODIUM 20 MG PO TBEC: 20.0000 mg | DELAYED_RELEASE_TABLET | Freq: Every day | ORAL | 11 refills | Status: DC

## 2019-11-11 MED ORDER — ONDANSETRON 4 MG PO TBDP: 4.0000 mg | ORAL_TABLET | Freq: Three times a day (TID) | ORAL | 2 refills | Status: DC | PRN

## 2019-11-11 NOTE — Addendum Note (Signed)
Addended by: Junius Roads on: 11/11/2019 11:52 AM   Modules accepted: Orders

## 2019-11-11 NOTE — Progress Notes (Signed)
Chief Complaint: FU  Referring Provider: Dr. Regino Schultze      ASSESSMENT AND PLAN;   #1.  IBS with constipation, has diarrhea when she eats sweet foods.  #2.  GERD.  #3. Lower abdominal pain (better with defecation)-resolved.  Plan: - Rabeprazole 20 mg po qd, 30 with 6 refills. - Zofran 4mg  ODT #20, 2 refiils - She does not want another colonoscopy or CT. I do agree. - FU as needed. - I have congratulated her on turning 89. HPI:    Heather Thomas is a 86 y.o. female  For follow-up visit  Diarrhea is better - happens when she eats sweets (donuts).  She just turned 89.  Hospital staff brought in cookies and several cards.  History of chronic constipation in past. Used to have diarrhea after eating oatmeal cookies  Lower abdominal pain which gets better with defecation No melena or hematochezia.  No fever or chills.  Has lost weight - not working currently.  Lost all her family.  Has grandson who takes care of her.  Since she feels better, she wants to hold off on any CT.  Had attempted colonoscopy 07/2003 up to 35 cm, severe sigmoid diverticulosis. Past Medical History:  Diagnosis Date  . A-fib ([place])   . Acid reflux   . AF (paroxysmal atrial fibrillation) ([place])   . Anemia   . DJD (degenerative joint disease)   . Osteoporosis   . PMR (polymyalgia rheumatica) ([facility])     Past Surgical History:  Procedure Laterality Date  . ABDOMINAL HYSTERECTOMY    . COLONOSCOPY  07/09/2003   Colonic sigmoid diverticulosis. Otherwise normal attempted colonoscopy to 35cm.   . ESOPHAGOGASTRODUODENOSCOPY  10/15/2014   Erosive gastritis. Otherwise normal EGD,   . INSERT / REPLACE / REMOVE PACEMAKER     Medtronic    Family History  Problem Relation Age of Onset  . Diabetes Daughter   . Heart disease Daughter   . Cervical cancer Daughter   . Heart attack Mother   . Lung disease Son   . Colon cancer Neg Hx   . Esophageal cancer Neg Hx     Social History   Tobacco  Use  . Smoking status: Never Smoker  . Smokeless tobacco: Never Used  Substance Use Topics  . Alcohol use: No  . Drug use: No    Current Outpatient Medications  Medication Sig Dispense Refill  . aspirin EC 81 MG tablet Take 1 tablet (81 mg total) by mouth daily. 90 tablet 3  . diphenoxylate-atropine (LOMOTIL) 2.5-0.025 MG tablet Take 1 tablet by mouth 2 (two) times daily as needed for diarrhea or loose stools. 60 tablet 1  . esomeprazole (NEXIUM) 40 MG capsule Take 1 capsule (40 mg total) by mouth daily at 12 noon. (Patient taking differently: Take 40 mg by mouth as needed. ) 30 capsule 0  . gabapentin (NEURONTIN) 100 MG capsule     . nitroGLYCERIN (NITROSTAT) 0.4 MG SL tablet Place 1 tablet (0.4 mg total) under the tongue every 5 (five) minutes as needed for chest pain. 90 tablet 3  . ondansetron (ZOFRAN ODT) 4 MG disintegrating tablet Take 1 tablet (4 mg total) by mouth every 8 (eight) hours as needed for nausea or vomiting. 20 tablet 2  . RABEprazole (ACIPHEX) 20 MG tablet Take 20 mg by mouth daily.    . sotalol (BETAPACE) 80 MG tablet TAKE 1/2 TABLET(40 MG) BY MOUTH TWICE DAILY 90 tablet 1  . TOVIAZ 4 MG TB24  tablet      No current facility-administered medications for this visit.    Allergies  Allergen Reactions  . Codeine Nausea Only    Review of Systems:  Negative except for HPI     Physical Exam:    Televisit  I connected with  Heather Thomas on 11/11/19 by a video enabled telemedicine application and verified that I am speaking with the correct person using two identifiers.   I discussed the limitations of evaluation and management by telemedicine. The patient expressed understanding and agreed to proceed.      Edman Circle, MD 11/11/2019, 11:45 AM  Cc: Wilmer Floor., MD

## 2019-11-11 NOTE — Patient Instructions (Signed)
If you are age 84 or older, your body mass index should be between 23-30. Your There is no height or weight on file to calculate BMI. If this is out of the aforementioned range listed, please consider follow up with your Primary Care Provider.  If you are age 5 or younger, your body mass index should be between 19-25. Your There is no height or weight on file to calculate BMI. If this is out of the aformentioned range listed, please consider follow up with your Primary Care Provider.   We have sent the following medications to your pharmacy for you to pick up at your convenience: Rabeprazole Zofran   Follow up as needed.   Thank you,  Dr. Lynann Bologna

## 2019-11-24 ENCOUNTER — Telehealth: Payer: Self-pay | Admitting: *Deleted

## 2019-11-24 ENCOUNTER — Other Ambulatory Visit: Payer: Self-pay

## 2019-11-24 ENCOUNTER — Encounter: Payer: Medicare HMO | Admitting: Cardiology

## 2019-11-24 NOTE — Telephone Encounter (Signed)
Spoke with patient to remind her to bring PPM monitor to appointment this afternoon. Pt verbalizes understanding and denies questions at this time.

## 2019-11-25 ENCOUNTER — Telehealth: Payer: Self-pay | Admitting: Cardiology

## 2019-11-25 NOTE — Telephone Encounter (Signed)
Heather Thomas is calling stating she has lost her medical alarm and believes she misplaced it at the [place] office yesterday 11/24/19 during her appointment with Dr. Elberta Fortis. She states it looks like a watch and has a black band. She is requesting a callback if it is found or turned in. Please advise.

## 2019-11-26 NOTE — Telephone Encounter (Signed)
Unable to locate medical alarm in any of the rooms and hasn't been turned in at the desk. Attempted to call patient to notify her that it is not here but no answer and no voicemail. Will continue efforts

## 2019-11-27 DIAGNOSIS — M1712 Unilateral primary osteoarthritis, left knee: Secondary | ICD-10-CM | POA: Diagnosis not present

## 2019-12-15 DIAGNOSIS — L989 Disorder of the skin and subcutaneous tissue, unspecified: Secondary | ICD-10-CM | POA: Diagnosis not present

## 2019-12-25 DIAGNOSIS — L300 Nummular dermatitis: Secondary | ICD-10-CM | POA: Diagnosis not present

## 2020-01-05 DIAGNOSIS — G629 Polyneuropathy, unspecified: Secondary | ICD-10-CM | POA: Diagnosis not present

## 2020-01-05 DIAGNOSIS — M810 Age-related osteoporosis without current pathological fracture: Secondary | ICD-10-CM | POA: Diagnosis not present

## 2020-01-05 DIAGNOSIS — Z6820 Body mass index (BMI) 20.0-20.9, adult: Secondary | ICD-10-CM | POA: Diagnosis not present

## 2020-01-05 DIAGNOSIS — R296 Repeated falls: Secondary | ICD-10-CM | POA: Diagnosis not present

## 2020-01-19 ENCOUNTER — Encounter: Payer: Medicare HMO | Admitting: Cardiology

## 2020-01-26 DIAGNOSIS — Z23 Encounter for immunization: Secondary | ICD-10-CM | POA: Diagnosis not present

## 2020-01-27 DIAGNOSIS — M810 Age-related osteoporosis without current pathological fracture: Secondary | ICD-10-CM | POA: Diagnosis not present

## 2020-02-02 ENCOUNTER — Other Ambulatory Visit: Payer: Self-pay

## 2020-02-02 ENCOUNTER — Ambulatory Visit (INDEPENDENT_AMBULATORY_CARE_PROVIDER_SITE_OTHER): Payer: Medicare HMO | Admitting: Cardiology

## 2020-02-02 ENCOUNTER — Telehealth: Payer: Self-pay | Admitting: *Deleted

## 2020-02-02 ENCOUNTER — Encounter: Payer: Self-pay | Admitting: Cardiology

## 2020-02-02 VITALS — BP 128/60 | HR 69 | Ht 60.0 in | Wt 92.0 lb

## 2020-02-02 DIAGNOSIS — R001 Bradycardia, unspecified: Secondary | ICD-10-CM | POA: Diagnosis not present

## 2020-02-02 DIAGNOSIS — Z79899 Other long term (current) drug therapy: Secondary | ICD-10-CM | POA: Diagnosis not present

## 2020-02-02 DIAGNOSIS — I480 Paroxysmal atrial fibrillation: Secondary | ICD-10-CM | POA: Diagnosis not present

## 2020-02-02 DIAGNOSIS — Z950 Presence of cardiac pacemaker: Secondary | ICD-10-CM

## 2020-02-02 LAB — CUP PACEART INCLINIC DEVICE CHECK
Brady Statistic AP VP Percent: 10.2 %
Brady Statistic AP VS Percent: 1.5 %
Brady Statistic AS VP Percent: 3.3 %
Brady Statistic AS VS Percent: 85 %
Date Time Interrogation Session: 20210503170330
Implantable Lead Implant Date: 20100615
Implantable Lead Implant Date: 20100615
Implantable Lead Location: 753859
Implantable Lead Location: 753860
Implantable Lead Model: 5076
Implantable Lead Model: 5092
Implantable Pulse Generator Implant Date: 20100615
Lead Channel Impedance Value: 471 Ohm
Lead Channel Impedance Value: 825 Ohm
Lead Channel Pacing Threshold Amplitude: 0.75 V
Lead Channel Pacing Threshold Amplitude: 0.75 V
Lead Channel Pacing Threshold Pulse Width: 0.4 ms
Lead Channel Pacing Threshold Pulse Width: 0.4 ms
Lead Channel Sensing Intrinsic Amplitude: 4 mV
Lead Channel Sensing Intrinsic Amplitude: 8 mV
Lead Channel Setting Pacing Amplitude: 1.5 V
Lead Channel Setting Pacing Amplitude: 2 V
Lead Channel Setting Pacing Pulse Width: 0.4 ms
Lead Channel Setting Sensing Sensitivity: 2.8 mV

## 2020-02-02 LAB — BASIC METABOLIC PANEL
BUN/Creatinine Ratio: 25 (ref 12–28)
BUN: 33 mg/dL (ref 10–36)
CO2: 24 mmol/L (ref 20–29)
Calcium: 9.8 mg/dL (ref 8.7–10.3)
Chloride: 99 mmol/L (ref 96–106)
Creatinine, Ser: 1.3 mg/dL — ABNORMAL HIGH (ref 0.57–1.00)
GFR calc Af Amer: 42 mL/min/{1.73_m2} — ABNORMAL LOW (ref >59)
GFR calc non Af Amer: 36 mL/min/{1.73_m2} — ABNORMAL LOW (ref >59)
Glucose: 98 mg/dL (ref 65–99)
Potassium: 5.9 mmol/L — ABNORMAL HIGH (ref 3.5–5.2)
Sodium: 137 mmol/L (ref 134–144)

## 2020-02-02 LAB — MAGNESIUM: Magnesium: 2.2 mg/dL (ref 1.6–2.3)

## 2020-02-02 NOTE — Progress Notes (Signed)
Electrophysiology Office Note   Date:  02/02/2020   ID:  Heather Thomas, DOB 05-14-1927, MRN [id number]  PCP:  Wilmer Floor., MD  Cardiologist:  Bing Matter Primary Electrophysiologist:  Ipek Westra Jorja Loa, MD    Chief Complaint: atrial fibrillation   History of Present Illness: Heather Thomas is a 86 y.o. female who is being seen today for the evaluation of atrial fibrillation at the request of Wilmer Floor., MD. Presenting today for electrophysiology evaluation.  She has a history of paroxysmal atrial fibrillation, and is status post Medtronic pacemaker.  She is currently not anticoagulated because, as reported, she is in a fragile state and has had GI issues.  She is currently on sotalol.  Today, denies symptoms of palpitations, chest pain, shortness of breath, orthopnea, PND, lower extremity edema, claudication, dizziness, presyncope, syncope, bleeding, or neurologic sequela. The patient is tolerating medications without difficulties.  She is overall done well.  She has no chest pain or shortness of breath.  She is able to do all her daily activities.  Device interrogation shows 17 months left on her device.  She does tell me that he was difficult to get numb at her initial implant.  Aside from that, she has no complaints.   Past Medical History:  Diagnosis Date  . A-fib ([facility])   . Acid reflux   . AF (paroxysmal atrial fibrillation) ([facility])   . Anemia   . DJD (degenerative joint disease)   . Osteoporosis   . PMR (polymyalgia rheumatica) ([facility])    Past Surgical History:  Procedure Laterality Date  . ABDOMINAL HYSTERECTOMY    . COLONOSCOPY  07/09/2003   Colonic sigmoid diverticulosis. Otherwise normal attempted colonoscopy to 35cm.   . ESOPHAGOGASTRODUODENOSCOPY  10/15/2014   Erosive gastritis. Otherwise normal EGD,   . INSERT / REPLACE / REMOVE PACEMAKER     Medtronic     Current Outpatient Medications  Medication Sig Dispense Refill  . aspirin EC 81 MG tablet  Take 1 tablet (81 mg total) by mouth daily. 90 tablet 3  . diphenoxylate-atropine (LOMOTIL) 2.5-0.025 MG tablet Take 1 tablet by mouth 2 (two) times daily as needed for diarrhea or loose stools. 60 tablet 1  . esomeprazole (NEXIUM) 40 MG capsule Take 1 capsule (40 mg total) by mouth daily at 12 noon. (Patient taking differently: Take 40 mg by mouth as needed. ) 30 capsule 0  . gabapentin (NEURONTIN) 100 MG capsule     . ondansetron (ZOFRAN ODT) 4 MG disintegrating tablet Take 1 tablet (4 mg total) by mouth every 8 (eight) hours as needed for nausea or vomiting. 20 tablet 2  . RABEprazole (ACIPHEX) 20 MG tablet Take 1 tablet (20 mg total) by mouth daily. 30 tablet 11  . sotalol (BETAPACE) 80 MG tablet TAKE 1/2 TABLET(40 MG) BY MOUTH TWICE DAILY 90 tablet 1  . TOVIAZ 4 MG TB24 tablet     . nitroGLYCERIN (NITROSTAT) 0.4 MG SL tablet Place 1 tablet (0.4 mg total) under the tongue every 5 (five) minutes as needed for chest pain. 90 tablet 3   No current facility-administered medications for this visit.    Allergies:   Codeine   Social History:  The patient  reports that she has never smoked. She has never used smokeless tobacco. She reports that she does not drink alcohol or use drugs.   Family History:  The patient's family history includes Cervical cancer in her daughter; Diabetes in her daughter; Heart attack in her mother; Heart  disease in her daughter; Lung disease in her son.    ROS:  Please see the history of present illness.   Otherwise, review of systems is positive for none.   All other systems are reviewed and negative.   PHYSICAL EXAM: VS:  BP 128/60   Pulse 69   Ht 5' (1.524 m)   Wt 92 lb (41.7 kg)   BMI 17.97 kg/m  , BMI Body mass index is 17.97 kg/m. GEN: Well nourished, well developed, in no acute distress  HEENT: normal  Neck: no JVD, carotid bruits, or masses Cardiac: RRR; no murmurs, rubs, or gallops,no edema  Respiratory:  clear to auscultation bilaterally, normal  work of breathing GI: soft, nontender, nondistended, + BS MS: no deformity or atrophy  Skin: warm and dry, device site well healed Neuro:  Strength and sensation are intact Psych: euthymic mood, full affect  EKG:  EKG is ordered today. Personal review of the ekg ordered shows sinus rhythm, rate 69  Personal review of the device interrogation today. Results in [place]: No results found for requested labs within last 8760 hours.    Lipid Panel  No results found for: CHOL, TRIG, HDL, CHOLHDL, VLDL, LDLCALC, LDLDIRECT   Wt Readings from Last 3 Encounters:  02/02/20 92 lb (41.7 kg)  10/16/19 93 lb (42.2 kg)  08/12/19 92 lb 8 oz (42 kg)      Other studies Reviewed: Additional studies/ records that were reviewed today include: TTE 2017  Review of the above records today demonstrates:  The left ventricular size is normal. The left ventricular wall motion is normal. The right ventricle is normal in size and function. Device lead in the right ventricle The left atrial size is normal. Right atrial size is normal. There is aortic valve sclerosis. There is trace aortic regurgitation. There is trace mitral regurgitation. There is mild tricuspid regurgitation. There is no pericardial effusion.   ASSESSMENT AND PLAN:  1.  Paroxysmal atrial fibrillation: Currently on sotalol with a CHA2DS2-VASc of 3.  Not anticoagulated due to fall risk.  He has fortunately remained in normal rhythm.  No changes.  2.  Sick sinus syndrome: It is post Medtronic dual-chamber pacemaker.  Device functioning appropriately.  No changes at this time.  Her device at 17 months left.  We Eli Pattillo have her hooked up for remote transmitter.     Current medicines are reviewed at length with the patient today.   The patient does not have concerns regarding her medicines.  The following changes were made today: None  Labs/ tests ordered today include:  Orders Placed This Encounter  Procedures  .  Basic metabolic panel  . Magnesium  . EKG 12-Lead     Disposition:   FU with Yeilin Zweber 1 year  Signed, Ronna Herskowitz Meredith Leeds, MD  02/02/2020 11:17 AM     [facility] [address] [place] [address] [phone] (office) [phone] (fax)

## 2020-02-02 NOTE — Patient Instructions (Addendum)
Medication Instructions:  Your physician recommends that you continue on your current medications as directed. Please refer to the Current Medication list given to you today.  *If you need a refill on your cardiac medications before your next appointment, please call your pharmacy*   Lab Work: Today: BMET & Magnesium level If you have labs (blood work) drawn today and your tests are completely normal, you will receive your results only by: Marland Kitchen MyChart Message (if you have MyChart) OR . A paper copy in the mail If you have any lab test that is abnormal or we need to change your treatment, we will call you to review the results.   Testing/Procedures: None ordered   Follow-Up: Remote monitoring is used to monitor your Pacemaker of ICD from home. This monitoring reduces the number of office visits required to check your device to one time per year. It allows [place] to keep an eye on the functioning of your device to ensure it is working properly. You are scheduled for a device check from home on 03/29/2020. You may send your transmission at any time that day. If you have a wireless device, the transmission will be sent automatically. After your physician reviews your transmission, you will receive a postcard with your next transmission date.  At [facility], you and your health needs are our priority.  As part of our continuing mission to provide you with exceptional heart care, we have created designated Provider Care Teams.  These Care Teams include your primary Cardiologist (physician) and Advanced Practice Providers (APPs -  Physician Assistants and Nurse Practitioners) who all work together to provide you with the care you need, when you need it.  We recommend signing up for the patient portal called "MyChart".  Sign up information is provided on this After Visit Summary.  MyChart is used to connect with patients for Virtual Visits (Telemedicine).  Patients are able to view lab/test results,  encounter notes, upcoming appointments, etc.  Non-urgent messages can be sent to your provider as well.   To learn more about what you can do with MyChart, go to [email or web address].    Your next appointment:   1 year(s)  The format for your next appointment:   In Person  Provider:   Loman Brooklyn, MD   Thank you for choosing [facility]!!   Dory Horn, RN [phone]    Other Instructions

## 2020-02-02 NOTE — Telephone Encounter (Signed)
Spoke with patient. She has not had a chance to look for her monitor yet. Ptt was to look for [employer] [id number] monitor at home as she turned in 2490 monitor today during OV. If she is unable to find monitor, will order a new one. Pt in agreement with plan and will call DC later this week after she's had a chance to look for the monitor.

## 2020-02-03 NOTE — Telephone Encounter (Signed)
Pt states she did not look for the monitor because she did not feel well today. She will look for before she goes to bed. I told her we will call tomorrow to follow up if she found the monitor.

## 2020-02-05 NOTE — Telephone Encounter (Signed)
Spoke with patient she states that she does indeed need a monitor she cannot find hers so I went ahead and ordered her a new one and I told her it will be 7-10 days before she receives it, also asked her to call me when she gets it. Patient verbalized understanding.

## 2020-02-10 DIAGNOSIS — L84 Corns and callosities: Secondary | ICD-10-CM | POA: Diagnosis not present

## 2020-02-18 NOTE — Telephone Encounter (Signed)
Patient states she has received her box but needs help plugging it in. States her son is coming this evening and can help her. Advised patient have bedside monitor on table within 4 feet wherever she sleeps with nothing between the box and her. Verbalizes understanding. Patient states she will attemp to send transmission when she has box connected.

## 2020-02-23 ENCOUNTER — Telehealth: Payer: Self-pay | Admitting: Gastroenterology

## 2020-02-23 NOTE — Telephone Encounter (Signed)
Pt stated that she is experiencing abd pain and requested a sooner appt.  She is currently scheduled for an OV 03/24/20.

## 2020-02-25 NOTE — Telephone Encounter (Signed)
No transmission available as of yet. Called patient to check if box was plugged in. No answer, LMOVM.

## 2020-02-26 ENCOUNTER — Telehealth: Payer: Self-pay | Admitting: Gastroenterology

## 2020-02-26 NOTE — Telephone Encounter (Signed)
Pt states that she has been dealing with abd pain and would like to be seen asap or have something prescribed. She is scheduled to see Dr. Chales Abrahams on 6/23 but she stated that she cannot wait until then. Pls call her.

## 2020-02-26 NOTE — Telephone Encounter (Signed)
Sent to Dr Chales Abrahams for advice.

## 2020-02-26 NOTE — Telephone Encounter (Signed)
Per Nehemiah Settle, Dr Blair Dolphin office called stating that patient has called our office regarding abdominal pain and that wants to hear back from [place]. Please advise regarding a prescription until appointment is available.  You have nothing available until late June.  Thanks, Malachi Bonds

## 2020-02-27 ENCOUNTER — Other Ambulatory Visit: Payer: Self-pay

## 2020-02-27 MED ORDER — DICYCLOMINE HCL 10 MG PO CAPS
10.0000 mg | ORAL_CAPSULE | Freq: Two times a day (BID) | ORAL | 0 refills | Status: DC
Start: 2020-02-27 — End: 2020-05-31

## 2020-02-27 MED ORDER — RABEPRAZOLE SODIUM 20 MG PO TBEC: 20.0000 mg | DELAYED_RELEASE_TABLET | Freq: Every day | ORAL | 11 refills | Status: DC

## 2020-02-27 NOTE — Telephone Encounter (Signed)
I know her well. -re-trial of bentyl 10mg  po bid, #60.  She had responded very well to that previously.  Fall precautions -Must continue aciphex 20mg  po qd as before (see last note) -X-ray KUB 2 view. Pl arrange at [facility]   RG

## 2020-02-27 NOTE — Telephone Encounter (Signed)
Pt advised to pick up medications prescribed.  Pt aware that she need to have Xrays of her abdomen but central scheduling is closed and that someone from our office on Tuesday will send the order for her to have her xray done and will call and let her know when to go.  Patient made aware of fall precautions.  Patient verbalized understanding.

## 2020-03-02 ENCOUNTER — Encounter: Payer: Self-pay | Admitting: Gastroenterology

## 2020-03-02 ENCOUNTER — Telehealth: Payer: Self-pay | Admitting: Gastroenterology

## 2020-03-02 DIAGNOSIS — R109 Unspecified abdominal pain: Secondary | ICD-10-CM | POA: Diagnosis not present

## 2020-03-02 DIAGNOSIS — R1010 Upper abdominal pain, unspecified: Secondary | ICD-10-CM | POA: Diagnosis not present

## 2020-03-02 DIAGNOSIS — R1030 Lower abdominal pain, unspecified: Secondary | ICD-10-CM | POA: Diagnosis not present

## 2020-03-04 NOTE — Telephone Encounter (Signed)
Patient called states she just went for an xray and is anxiously waiting on results please call her from 11 am on.

## 2020-03-05 NOTE — Telephone Encounter (Signed)
The pt states her son will not be in town until next weekend. I told her when he get in town to have him plug it up and help her send a transmission. I told her if he do not know how to send the transmission she can call me and I will help her over the phone. I gave her my direct office number.

## 2020-03-05 NOTE — Telephone Encounter (Signed)
Her answering machine is broke-it sounds like it isn't but it is. Will be sitting by the phone all afternoon because she is really anxious to hear her results. Please call her even if they are  Normal still wants to know.

## 2020-03-08 NOTE — Telephone Encounter (Signed)
Patient is calling again still waiting on results from her xray. She is asking for someone please call her. She states she was sent to get the test done urgently and now has no response on test.

## 2020-03-08 NOTE — Telephone Encounter (Signed)
Spoke with patient regarding x-rays result because she was worried. IMPRESSION : Non obstructive bowel gas patter.  Patient states she cannot take Aciphex or Bentyl because that interferes with her osteoporosis injection that she gets every six weeks. Still having discomfort. Please advise.

## 2020-03-09 NOTE — Telephone Encounter (Signed)
Tell her to please take AcipHex 20 mg p.o. once a day x 2 weeks and then stop.  It will not interfere with her osteoporosis medicine Also Bentyl 10 mg p.o. twice daily as needed for abdominal pain x 2 weeks.  It will not interfere with her osteoporosis medicine either She has responded very well to these medicines in the past. I have also reviewed her x-ray.  It looks good.  RG

## 2020-03-12 NOTE — Telephone Encounter (Signed)
Spoke with patient today.  Advised her to take Aciphex and Bentyl as recommended by Dr Chales Abrahams.  Informed of normal x-rays.  Patient verbalized understanding.

## 2020-03-12 NOTE — Telephone Encounter (Signed)
Spoke with pt, she states he grandson is coming home today, asked that she write her self a note to remind him to set up the monitor this weekend.  Pt stated if we call her back do not leave a VM as her machine is not working properly and she cannot obtain her messages.  If monitor still not set up, ask patient for grandson's phone number and permission to speak with him.

## 2020-03-15 ENCOUNTER — Telehealth: Payer: Self-pay

## 2020-03-15 ENCOUNTER — Ambulatory Visit (INDEPENDENT_AMBULATORY_CARE_PROVIDER_SITE_OTHER): Payer: Medicare HMO | Admitting: *Deleted

## 2020-03-15 DIAGNOSIS — R001 Bradycardia, unspecified: Secondary | ICD-10-CM | POA: Diagnosis not present

## 2020-03-15 LAB — CUP PACEART REMOTE DEVICE CHECK
Battery Impedance: 4194 Ohm
Battery Remaining Longevity: 16 mo
Battery Voltage: 2.7 V
Brady Statistic AP VP Percent: 15 %
Brady Statistic AP VS Percent: 1 %
Brady Statistic AS VP Percent: 8 %
Brady Statistic AS VS Percent: 77 %
Date Time Interrogation Session: 20210614124649
Implantable Lead Implant Date: 20100615
Implantable Lead Implant Date: 20100615
Implantable Lead Location: 753859
Implantable Lead Location: 753860
Implantable Lead Model: 5076
Implantable Lead Model: 5092
Implantable Pulse Generator Implant Date: 20100615
Lead Channel Impedance Value: 463 Ohm
Lead Channel Impedance Value: 787 Ohm
Lead Channel Pacing Threshold Amplitude: 0.375 V
Lead Channel Pacing Threshold Amplitude: 0.625 V
Lead Channel Pacing Threshold Pulse Width: 0.4 ms
Lead Channel Pacing Threshold Pulse Width: 0.4 ms
Lead Channel Setting Pacing Amplitude: 1.5 V
Lead Channel Setting Pacing Amplitude: 2 V
Lead Channel Setting Pacing Pulse Width: 0.4 ms
Lead Channel Setting Sensing Sensitivity: 2.8 mV

## 2020-03-15 NOTE — Telephone Encounter (Signed)
Pt called wanting help on getting a new home monitor since the one she has is too complicated for her to do and it is not automatic. Evans Lance is speaking with [employer] and is trying to get patient a new one. Patient would like a call back when we get an answer, she says not to leave a voicemail as it is not working

## 2020-03-15 NOTE — Telephone Encounter (Signed)
I help the pt grandson send the transmission. I told him if it will make her more comfortable we can schedule the transmissions around his work schedule every 91 days. That way she will have someone she trust help her. He agreed to it and states he will call my direct office number if he will not be in town on the date of the transmission due.

## 2020-03-15 NOTE — Telephone Encounter (Signed)
Follow up  Patient calling back in about monitor, transferred call to device clinic.

## 2020-03-15 NOTE — Telephone Encounter (Signed)
I spoke [employer] and the pt has the monitor that is compatible with her ppm. I asked the pt was her grandson home so I can help him send the transmission but he was not available at the time. I told her I will call her back at 12:30 pm to help her and her grandson learn how to send a manual transmission. If that do not work then she would have to come into the office every 6 months for her ppm to be check.

## 2020-03-16 DIAGNOSIS — H02883 Meibomian gland dysfunction of right eye, unspecified eyelid: Secondary | ICD-10-CM | POA: Diagnosis not present

## 2020-03-16 DIAGNOSIS — H0100A Unspecified blepharitis right eye, upper and lower eyelids: Secondary | ICD-10-CM | POA: Diagnosis not present

## 2020-03-16 DIAGNOSIS — H0100B Unspecified blepharitis left eye, upper and lower eyelids: Secondary | ICD-10-CM | POA: Diagnosis not present

## 2020-03-16 DIAGNOSIS — H02886 Meibomian gland dysfunction of left eye, unspecified eyelid: Secondary | ICD-10-CM | POA: Diagnosis not present

## 2020-03-16 NOTE — Progress Notes (Signed)
Remote pacemaker transmission.   

## 2020-03-22 DIAGNOSIS — L989 Disorder of the skin and subcutaneous tissue, unspecified: Secondary | ICD-10-CM | POA: Diagnosis not present

## 2020-03-24 ENCOUNTER — Ambulatory Visit: Payer: Medicare HMO | Admitting: Gastroenterology

## 2020-03-24 ENCOUNTER — Encounter: Payer: Self-pay | Admitting: Gastroenterology

## 2020-03-24 VITALS — BP 118/58 | HR 63 | Temp 97.9°F | Ht 60.0 in | Wt 91.2 lb

## 2020-03-24 DIAGNOSIS — R1030 Lower abdominal pain, unspecified: Secondary | ICD-10-CM | POA: Diagnosis not present

## 2020-03-24 DIAGNOSIS — K219 Gastro-esophageal reflux disease without esophagitis: Secondary | ICD-10-CM

## 2020-03-24 DIAGNOSIS — K589 Irritable bowel syndrome without diarrhea: Secondary | ICD-10-CM

## 2020-03-24 NOTE — Patient Instructions (Signed)
If you are age 84 or older, your body mass index should be between 23-30. Your Body mass index is 17.82 kg/m. If this is out of the aforementioned range listed, please consider follow up with your Primary Care Provider.  If you are age 52 or younger, your body mass index should be between 19-25. Your Body mass index is 17.82 kg/m. If this is out of the aformentioned range listed, please consider follow up with your Primary Care Provider.   Take Miralax 1/2 pack on Monday and Wednesday. Put in juice.  Drink plenty of water (four small bottles daily)  Take Aciphex as needed.  Take Bentyl as needed.  Follow up as needed.   Thank you for choosing me and  Gastroenterology.   Lynann Bologna, MD

## 2020-03-24 NOTE — Progress Notes (Signed)
Chief Complaint: FU  Referring Provider: Dr. Wyline Mood      ASSESSMENT AND PLAN;   #1.  IBS-C, has diarrhea when she eats sweet foods.  #2.  GERD.  #3. Lower abdominal pain (better with defecation)-resolved. Neg CT AP 06/2019  Plan: - Rabeprazole 20 mg po qd prn (very much concerned about worsening of osteoporosis) - Miralax 1/2 pak twice a week (Monday and wednesday). Put in juice. - Drink plenty of water. 4 small bottles/day - She does not want another colonoscopy or CT. I do agree. - Bentyl 10mg  prn - FU as needed. HPI:    Heather Thomas is a 86 y.o. female  For follow-up visit Has been constipated.  Has diarrhea when she eats sweets like donuts or cookies.  She also has diarrhea if she has artificial sweeteners.  Lower abdominal discomfort which gets better with defecation  Does take stool softeners on as-needed basis  No fever or chills.  Has occasional indigestion/heartburn.  This did get better with AcipHex in the past.  She is very much concerned about taking AcipHex due to history of osteoporosis.  She is currently on Prolia  Lost all her family.  Has grandson who takes care of her.  Since she feels better, she wants to hold off on any CT.  Had attempted colonoscopy 07/2003 up to 35 cm, severe sigmoid diverticulosis. Past Medical History:  Diagnosis Date  . A-fib (HCC)   . Acid reflux   . AF (paroxysmal atrial fibrillation) (HCC)   . Anemia   . DJD (degenerative joint disease)   . Osteoporosis   . PMR (polymyalgia rheumatica) (HCC)     Past Surgical History:  Procedure Laterality Date  . ABDOMINAL HYSTERECTOMY    . COLONOSCOPY  07/09/2003   Colonic sigmoid diverticulosis. Otherwise normal attempted colonoscopy to 35cm.   . ESOPHAGOGASTRODUODENOSCOPY  10/15/2014   Erosive gastritis. Otherwise normal EGD,   . INSERT / REPLACE / REMOVE PACEMAKER     [employer]    Family History  Problem Relation Age of Onset  . Diabetes Daughter   . Heart  disease Daughter   . Cervical cancer Daughter   . Heart attack Mother   . Lung disease Son   . Colon cancer Neg Hx   . Esophageal cancer Neg Hx     Social History   Tobacco Use  . Smoking status: Never Smoker  . Smokeless tobacco: Never Used  Vaping Use  . Vaping Use: Never used  Substance Use Topics  . Alcohol use: No  . Drug use: No    Current Outpatient Medications  Medication Sig Dispense Refill  . aspirin EC 81 MG tablet Take 1 tablet (81 mg total) by mouth daily. 90 tablet 3  . denosumab (PROLIA) 60 MG/ML SOSY injection Inject 60 mg into the skin every 6 (six) months. Due 08/2020    . gabapentin (NEURONTIN) 100 MG capsule     . sotalol (BETAPACE) 80 MG tablet TAKE 1/2 TABLET(40 MG) BY MOUTH TWICE DAILY 90 tablet 1  . dicyclomine (BENTYL) 10 MG capsule Take 1 capsule (10 mg total) by mouth in the morning and at bedtime. (Patient not taking: Reported on 03/24/2020) 60 capsule 0  . diphenoxylate-atropine (LOMOTIL) 2.5-0.025 MG tablet Take 1 tablet by mouth 2 (two) times daily as needed for diarrhea or loose stools. (Patient not taking: Reported on 03/24/2020) 60 tablet 1  . esomeprazole (NEXIUM) 40 MG capsule Take 1 capsule (40 mg total) by mouth daily  at 12 noon. (Patient not taking: Reported on 03/24/2020) 30 capsule 0  . nitroGLYCERIN (NITROSTAT) 0.4 MG SL tablet Place 1 tablet (0.4 mg total) under the tongue every 5 (five) minutes as needed for chest pain. (Patient not taking: Reported on 03/24/2020) 90 tablet 3  . ondansetron (ZOFRAN ODT) 4 MG disintegrating tablet Take 1 tablet (4 mg total) by mouth every 8 (eight) hours as needed for nausea or vomiting. (Patient not taking: Reported on 03/24/2020) 20 tablet 2  . RABEprazole (ACIPHEX) 20 MG tablet Take 1 tablet (20 mg total) by mouth daily. (Patient not taking: Reported on 03/24/2020) 30 tablet 11  . TOVIAZ 4 MG TB24 tablet  (Patient not taking: Reported on 03/24/2020)     No current facility-administered medications for this  visit.    Allergies  Allergen Reactions  . Codeine Nausea Only    Review of Systems:  Negative except for HPI     Physical Exam:    Today's Vitals   03/24/20 1028  BP: (!) 118/58  Pulse: 63  Temp: 97.9 F (36.6 C)  Weight: 91 lb 4 oz (41.4 kg)  Height: 5' (1.524 m)   Body mass index is 17.82 kg/m.  Gen: awake, alert, NAD.  Very frail HEENT: anicteric, no pallor CV: RRR, no mrg Pulm: CTA b/l Abd: soft, NT/ND, +BS throughout Ext: no c/c/e Neuro: nonfocal.  She walks with a walker.       Carmell Austria, MD 03/24/2020, 10:50 AM  Cc: Helen Hashimoto., MD

## 2020-04-01 DIAGNOSIS — M216X2 Other acquired deformities of left foot: Secondary | ICD-10-CM | POA: Diagnosis not present

## 2020-04-01 DIAGNOSIS — L600 Ingrowing nail: Secondary | ICD-10-CM | POA: Diagnosis not present

## 2020-04-01 DIAGNOSIS — M216X1 Other acquired deformities of right foot: Secondary | ICD-10-CM | POA: Diagnosis not present

## 2020-04-01 DIAGNOSIS — M79675 Pain in left toe(s): Secondary | ICD-10-CM | POA: Diagnosis not present

## 2020-04-07 DIAGNOSIS — L853 Xerosis cutis: Secondary | ICD-10-CM | POA: Diagnosis not present

## 2020-04-13 DIAGNOSIS — L600 Ingrowing nail: Secondary | ICD-10-CM | POA: Diagnosis not present

## 2020-04-14 ENCOUNTER — Ambulatory Visit: Payer: Medicare HMO | Admitting: Cardiology

## 2020-04-14 ENCOUNTER — Encounter: Payer: Self-pay | Admitting: Cardiology

## 2020-04-14 ENCOUNTER — Other Ambulatory Visit: Payer: Self-pay

## 2020-04-14 VITALS — BP 120/50 | HR 68 | Ht 60.0 in | Wt 90.0 lb

## 2020-04-14 DIAGNOSIS — I480 Paroxysmal atrial fibrillation: Secondary | ICD-10-CM

## 2020-04-14 DIAGNOSIS — Z950 Presence of cardiac pacemaker: Secondary | ICD-10-CM

## 2020-04-14 DIAGNOSIS — E7800 Pure hypercholesterolemia, unspecified: Secondary | ICD-10-CM

## 2020-04-14 NOTE — Progress Notes (Signed)
Cardiology Office Note:    Date:  04/14/2020   ID:  Heather Thomas, DOB [birth year], MRN [id number]  PCP:  Wilmer Floor., MD  Cardiologist:  Gypsy Balsam, MD    Referring MD: Wilmer Floor., MD   No chief complaint on file. I am doing fine  History of Present Illness:    Heather Thomas is a 86 y.o. female + history significant for paroxysmal ablation, not anticoagulated because of history of GI bleeding.  Pacemaker present dyslipidemia.  Comes today to my office for follow-up.  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  Denies having cardiac complaints.  No chest pain tightness squeezing pressure burning chest  Past Medical History:  Diagnosis Date  . A-fib (HCC)   . Acid reflux   . AF (paroxysmal atrial fibrillation) (HCC)   . Anemia   . DJD (degenerative joint disease)   . Osteoporosis   . PMR (polymyalgia rheumatica) (HCC)     Past Surgical History:  Procedure Laterality Date  . ABDOMINAL HYSTERECTOMY    . COLONOSCOPY  07/09/2003   Colonic sigmoid diverticulosis. Otherwise normal attempted colonoscopy to 35cm.   . ESOPHAGOGASTRODUODENOSCOPY  10/15/2014   Erosive gastritis. Otherwise normal EGD,   . INSERT / REPLACE / REMOVE PACEMAKER     [employer]    Current Medications: Current Meds  Medication Sig  . aspirin EC 81 MG tablet Take 1 tablet (81 mg total) by mouth daily.  Marland Kitchen denosumab (PROLIA) 60 MG/ML SOSY injection Inject 60 mg into the skin every 6 (six) months. Due 08/2020  . dicyclomine (BENTYL) 10 MG capsule Take 1 capsule (10 mg total) by mouth in the morning and at bedtime.  . diphenoxylate-atropine (LOMOTIL) 2.5-0.025 MG tablet Take 1 tablet by mouth 2 (two) times daily as needed for diarrhea or  loose stools.  Marland Kitchen esomeprazole (NEXIUM) 40 MG capsule Take 1 capsule (40 mg total) by mouth daily at 12 noon.  . gabapentin (NEURONTIN) 100 MG capsule   . nitroGLYCERIN (NITROSTAT) 0.4 MG SL tablet Place 1 tablet (0.4 mg total) under the tongue every 5 (five) minutes as needed for chest pain.  Marland Kitchen ondansetron (ZOFRAN ODT) 4 MG disintegrating tablet Take 1 tablet (4 mg total) by mouth every 8 (eight) hours as needed for nausea or vomiting.  . RABEprazole (ACIPHEX) 20 MG tablet Take 1 tablet (20 mg total) by mouth daily.  . sotalol (BETAPACE) 80 MG tablet TAKE 1/2 TABLET(40 MG) BY MOUTH TWICE DAILY  . TOVIAZ 4 MG TB24 tablet      Allergies:   Codeine   Social History   Socioeconomic History  . Marital status: Married    Spouse name: Not on file  . Number of children: 3  . Years of education: Not on file  . Highest education level: Not on file  Occupational History  . Not on file  Tobacco Use  . Smoking status: Never Smoker  . Smokeless tobacco: Never Used  Vaping Use  . Vaping Use: Never used  Substance and Sexual Activity  . Alcohol use: No  . Drug use: No  . Sexual activity: Not on file  Other Topics Concern  . Not on file  Social History  Narrative  . Not on file   Social Determinants of Health   Financial Resource Strain:   . Difficulty of Paying Living Expenses:   Food Insecurity:   . Worried About Programme researcher, broadcasting/film/video in the Last Year:   . Barista in the Last Year:   Transportation Needs:   . Freight forwarder (Medical):   Marland Kitchen Lack of Transportation (Non-Medical):   Physical Activity:   . Days of Exercise per Week:   . Minutes of Exercise per Session:   Stress:   . Feeling of Stress :   Social Connections:   . Frequency of Communication with Friends and Family:   . Frequency of Social Gatherings with Friends and Family:   . Attends Religious Services:   . Active Member of Clubs or Organizations:   . Attends Banker Meetings:   Marland Kitchen Marital  Status:      Family History: The patient's family history includes Cervical cancer in her daughter; Diabetes in her daughter; Heart attack in her mother; Heart disease in her daughter; Lung disease in her son. There is no history of Colon cancer or Esophageal cancer. ROS:   Please see the history of present illness.    All 14 point review of systems negative except as described per history of present illness  EKGs/Labs/Other Studies Reviewed:      Recent Labs: 02/02/2020: BUN 33; Creatinine, Ser 1.30; Magnesium 2.2; Potassium 5.9; Sodium 137  Recent Lipid Panel No results found for: CHOL, TRIG, HDL, CHOLHDL, VLDL, LDLCALC, LDLDIRECT  Physical Exam:    VS:  BP (!) 120/50   Pulse 68   Ht 5' (1.524 m)   Wt 90 lb (40.8 kg)   SpO2 94%   BMI 17.58 kg/m     Wt Readings from Last 3 Encounters:  04/14/20 90 lb (40.8 kg)  03/24/20 91 lb 4 oz (41.4 kg)  02/02/20 92 lb (41.7 kg)     GEN:  Well nourished, well developed in no acute distress HEENT: Normal NECK: No JVD; No carotid bruits LYMPHATICS: No lymphadenopathy CARDIAC: RRR, no murmurs, no rubs, no gallops RESPIRATORY:  Clear to auscultation without rales, wheezing or rhonchi  ABDOMEN: Soft, non-tender, non-distended MUSCULOSKELETAL:  No edema; No deformity  SKIN: Warm and dry LOWER EXTREMITIES: no swelling NEUROLOGIC:  Alert and oriented x 3 PSYCHIATRIC:  Normal affect   ASSESSMENT:    1. PAF (paroxysmal atrial fibrillation) (HCC)   2. Pacemaker   3. Hypercholesteremia    PLAN:    In order of problems listed above:  1. Paroxysmal atrial fibrillation denies having a palpitation only on aspirin because of fragility and GI issue.  She is taking Betapace 80 mg half a tablet twice daily. 2. Pacemaker present with good function followed by EP team.  She got almost a year left in the past. 3. Dyslipidemia: She is not on any medications I does have her K PN from June 2020 which show LDL of 123 HDL 57.  She does not want to  use any medication for it.   Medication Adjustments/Labs and Tests Ordered: Current medicines are reviewed at length with the patient today.  Concerns regarding medicines are outlined above.  No orders of the defined types were placed in this encounter.  Medication changes: No orders of the defined types were placed in this encounter.   Signed, Georgeanna Lea, MD, [facility] 04/14/2020 3:00 PM    [facility]

## 2020-04-14 NOTE — Patient Instructions (Signed)

## 2020-04-20 ENCOUNTER — Other Ambulatory Visit: Payer: Self-pay | Admitting: Cardiology

## 2020-04-20 NOTE — Telephone Encounter (Signed)
Rx refill sent to pharmacy. 

## 2020-04-23 DIAGNOSIS — Z9181 History of falling: Secondary | ICD-10-CM | POA: Diagnosis not present

## 2020-04-23 DIAGNOSIS — Z1331 Encounter for screening for depression: Secondary | ICD-10-CM | POA: Diagnosis not present

## 2020-04-23 DIAGNOSIS — E785 Hyperlipidemia, unspecified: Secondary | ICD-10-CM | POA: Diagnosis not present

## 2020-04-23 DIAGNOSIS — Z0000 Encounter for general adult medical examination without abnormal findings: Secondary | ICD-10-CM | POA: Diagnosis not present

## 2020-04-26 DIAGNOSIS — G629 Polyneuropathy, unspecified: Secondary | ICD-10-CM | POA: Diagnosis not present

## 2020-04-26 DIAGNOSIS — E46 Unspecified protein-calorie malnutrition: Secondary | ICD-10-CM | POA: Diagnosis not present

## 2020-04-26 DIAGNOSIS — K219 Gastro-esophageal reflux disease without esophagitis: Secondary | ICD-10-CM | POA: Diagnosis not present

## 2020-04-26 DIAGNOSIS — I4891 Unspecified atrial fibrillation: Secondary | ICD-10-CM | POA: Diagnosis not present

## 2020-04-26 DIAGNOSIS — E559 Vitamin D deficiency, unspecified: Secondary | ICD-10-CM | POA: Diagnosis not present

## 2020-04-26 DIAGNOSIS — I495 Sick sinus syndrome: Secondary | ICD-10-CM | POA: Diagnosis not present

## 2020-04-26 DIAGNOSIS — N289 Disorder of kidney and ureter, unspecified: Secondary | ICD-10-CM | POA: Diagnosis not present

## 2020-04-26 DIAGNOSIS — Z9181 History of falling: Secondary | ICD-10-CM | POA: Diagnosis not present

## 2020-04-26 DIAGNOSIS — E785 Hyperlipidemia, unspecified: Secondary | ICD-10-CM | POA: Diagnosis not present

## 2020-04-27 DIAGNOSIS — M216X2 Other acquired deformities of left foot: Secondary | ICD-10-CM | POA: Diagnosis not present

## 2020-04-27 DIAGNOSIS — M216X1 Other acquired deformities of right foot: Secondary | ICD-10-CM | POA: Diagnosis not present

## 2020-04-27 DIAGNOSIS — L600 Ingrowing nail: Secondary | ICD-10-CM | POA: Diagnosis not present

## 2020-05-10 DIAGNOSIS — L84 Corns and callosities: Secondary | ICD-10-CM | POA: Diagnosis not present

## 2020-05-18 ENCOUNTER — Telehealth: Payer: Self-pay | Admitting: Cardiology

## 2020-05-18 NOTE — Telephone Encounter (Signed)
New Message  Patient is calling in to see about her pacemaker. States that she received a call from [facility] about her battery being almost out, but was told by Dr. Kirtland Bouchard that she had 18 months left on it. Please give patient a call back to discuss.

## 2020-05-18 NOTE — Telephone Encounter (Signed)
Spoke with pt, advised her PM battery at last check has about 18 months left on it (51months to ERI + ~ 3 months)  Assured patient that the phone call she received was not regarding her Pacemaker.    In discussion determined that pt received the phone call from [phone].  This number is associated with Medical Alert.  Pt confirmed that she does have a medical alert button and she was recently told that the battery is dying/ dead but she doesn't know how to change the battery.  Suggested she enlist the help of her grandson who lives next door.  Pt not satisfied with that suggestion.  At her request, I called Medical alert and left a message requesting they call her back with detailed instructions on how to resolve her situation.

## 2020-05-18 NOTE — Telephone Encounter (Signed)
The pt wanted to talk with the nurse because someone from [facility] called about her battery being low . She is nervous and upset. I let her speak with Amy, rn .

## 2020-05-25 ENCOUNTER — Telehealth: Payer: Self-pay | Admitting: Cardiology

## 2020-05-25 DIAGNOSIS — R0789 Other chest pain: Secondary | ICD-10-CM

## 2020-05-25 MED ORDER — NITROGLYCERIN 0.4 MG SL SUBL: 0.4000 mg | SUBLINGUAL_TABLET | SUBLINGUAL | 3 refills | Status: AC | PRN

## 2020-05-25 NOTE — Telephone Encounter (Signed)
Pt c/o of Chest Pain: STAT if CP now or developed within 24 hours  1. Are you having CP right now? yes  2. Are you experiencing any other symptoms (ex. SOB, nausea, vomiting, sweating)? no  3. How long have you been experiencing CP? For a week  4. Is your CP continuous or coming and going? continuous  5. Have you taken Nitroglycerin? No     ?

## 2020-05-25 NOTE — Telephone Encounter (Signed)
Spoke to the patient just now and she let me know that she has been having chest pain off and on for the past few weeks. She states that she does have this pain right now as well. She does not have any way to take her vital signs at this time. She denies any SOB, dizziness, nausea, or vomiting.   I told her that at this time I thought it would be best for her to be evaluated in the ED for this continuous chest pain but she states that she will not do this. I sent in a refill on her nitroglycerin at this time as she said she had some but it was old and expired.   She has an appointment with Dr. Janyce Llanos on Thursday and I will send this message over to him.

## 2020-05-25 NOTE — Telephone Encounter (Signed)
Your device is appropriate, she still have ongoing chest pain she is to go to the emergency room

## 2020-05-27 ENCOUNTER — Encounter: Payer: Self-pay | Admitting: Cardiology

## 2020-05-27 ENCOUNTER — Ambulatory Visit: Payer: Medicare HMO | Admitting: Cardiology

## 2020-05-27 ENCOUNTER — Other Ambulatory Visit: Payer: Self-pay

## 2020-05-27 VITALS — BP 120/60 | HR 62 | Ht 60.0 in | Wt 90.0 lb

## 2020-05-27 DIAGNOSIS — R001 Bradycardia, unspecified: Secondary | ICD-10-CM

## 2020-05-27 DIAGNOSIS — I480 Paroxysmal atrial fibrillation: Secondary | ICD-10-CM | POA: Diagnosis not present

## 2020-05-27 DIAGNOSIS — R0789 Other chest pain: Secondary | ICD-10-CM

## 2020-05-27 DIAGNOSIS — Z950 Presence of cardiac pacemaker: Secondary | ICD-10-CM | POA: Diagnosis not present

## 2020-05-27 NOTE — Patient Instructions (Signed)

## 2020-05-27 NOTE — Progress Notes (Signed)
Cardiology Office Note:    Date:  05/27/2020   ID:  Heather Thomas, DOB 1927/08/26, MRN [id number]  PCP:  Wilmer Floor., MD  Cardiologist:  Gypsy Balsam, MD    Referring MD: Wilmer Floor., MD   Chief Complaint  Patient presents with  . Chest Pain  I have chest pain  History of Present Illness:    Heather Thomas is a 86 y.o. female with past medical history significant for paroxysmal atrial fibrillation, pacemaker, anemia, gastroesophageal reflux disease.  She requested to be seen because of episode of chest pain.  She tells me right away she cannot tell me if this is stomach or heart.  There is no correlation between food and pain there was no correlation between exercises and pain.  Eating moving around does not change characteristic of the pain.  Pain can last for few hours straight.,  However she also told me when she is with people and when she is around people she usually does not have this pain.  She tells me that she thinks that it may be related to her psychological state.  She lives alone and she is very sad.  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  Past Medical History:  Diagnosis Date  . A-fib (HCC)   . Acid reflux   . AF (paroxysmal atrial fibrillation) (HCC)   . Anemia   . DJD (degenerative joint disease)   . Osteoporosis   . PMR (polymyalgia rheumatica) (HCC)     Past Surgical History:  Procedure Laterality Date  . ABDOMINAL HYSTERECTOMY    . COLONOSCOPY  07/09/2003   Colonic sigmoid diverticulosis. Otherwise normal attempted colonoscopy to 35cm.   . ESOPHAGOGASTRODUODENOSCOPY  10/15/2014   Erosive gastritis. Otherwise normal EGD,   . INSERT / REPLACE / REMOVE PACEMAKER     [employer]    Current Medications: Current Meds  Medication Sig  . aspirin EC 81 MG tablet Take 1 tablet (81 mg total) by mouth daily.  Marland Kitchen denosumab (PROLIA) 60 MG/ML SOSY injection Inject 60 mg  into the skin every 6 (six) months. Due 08/2020  . dicyclomine (BENTYL) 10 MG capsule Take 1 capsule (10 mg total) by mouth in the morning and at bedtime.  . diphenoxylate-atropine (LOMOTIL) 2.5-0.025 MG tablet Take 1 tablet by mouth 2 (two) times daily as needed for diarrhea or loose stools.  Marland Kitchen esomeprazole (NEXIUM) 40 MG capsule Take 1 capsule (40 mg total) by mouth daily at 12 noon.  . gabapentin (NEURONTIN) 100 MG capsule   . nitroGLYCERIN (NITROSTAT) 0.4 MG SL tablet Place 1 tablet (0.4 mg total) under the tongue every 5 (five) minutes as needed for chest pain.  Marland Kitchen ondansetron (ZOFRAN ODT) 4 MG disintegrating tablet Take 1 tablet (4 mg total) by mouth every 8 (eight) hours as needed for nausea or vomiting.  . RABEprazole (ACIPHEX) 20 MG tablet Take 1 tablet (20 mg total) by mouth daily.  . sotalol (BETAPACE) 80 MG tablet TAKE 1/2 TABLET(40 MG) BY MOUTH TWICE DAILY  . TOVIAZ 4 MG TB24 tablet      Allergies:   Codeine   Social History   Socioeconomic History  . Marital status: Married    Spouse name: Not on file  . Number of children: 3  . Years of education: Not on file  . Highest education level: Not on file  Occupational History  . Not on file  Tobacco  Use  . Smoking status: Never Smoker  . Smokeless tobacco: Never Used  Vaping Use  . Vaping Use: Never used  Substance and Sexual Activity  . Alcohol use: No  . Drug use: No  . Sexual activity: Not on file  Other Topics Concern  . Not on file  Social History Narrative  . Not on file   Social Determinants of Health   Financial Resource Strain:   . Difficulty of Paying Living Expenses: Not on file  Food Insecurity:   . Worried About Programme researcher, broadcasting/film/video in the Last Year: Not on file  . Ran Out of Food in the Last Year: Not on file  Transportation Needs:   . Lack of Transportation (Medical): Not on file  . Lack of Transportation (Non-Medical): Not on file  Physical Activity:   . Days of Exercise per Week: Not on file   . Minutes of Exercise per Session: Not on file  Stress:   . Feeling of Stress : Not on file  Social Connections:   . Frequency of Communication with Friends and Family: Not on file  . Frequency of Social Gatherings with Friends and Family: Not on file  . Attends Religious Services: Not on file  . Active Member of Clubs or Organizations: Not on file  . Attends Banker Meetings: Not on file  . Marital Status: Not on file     Family History: The patient's family history includes Cervical cancer in her daughter; Diabetes in her daughter; Heart attack in her mother; Heart disease in her daughter; Lung disease in her son. There is no history of Colon cancer or Esophageal cancer. ROS:   Please see the history of present illness.    All 14 point review of systems negative except as described per history of present illness  EKGs/Labs/Other Studies Reviewed:      Recent Labs: 02/02/2020: BUN 33; Creatinine, Ser 1.30; Magnesium 2.2; Potassium 5.9; Sodium 137  Recent Lipid Panel No results found for: CHOL, TRIG, HDL, CHOLHDL, VLDL, LDLCALC, LDLDIRECT  Physical Exam:    VS:  BP 120/60 (BP Location: Right Arm, Patient Position: Sitting, Cuff Size: Small)   Pulse 62   Ht 5' (1.524 m)   Wt 90 lb (40.8 kg)   SpO2 98%   BMI 17.58 kg/m     Wt Readings from Last 3 Encounters:  05/27/20 90 lb (40.8 kg)  04/14/20 90 lb (40.8 kg)  03/24/20 91 lb 4 oz (41.4 kg)     GEN:  Well nourished, well developed in no acute distress HEENT: Normal NECK: No JVD; No carotid bruits LYMPHATICS: No lymphadenopathy CARDIAC: RRR, no murmurs, no rubs, no gallops RESPIRATORY:  Clear to auscultation without rales, wheezing or rhonchi  ABDOMEN: Soft, non-tender, non-distended MUSCULOSKELETAL:  No edema; No deformity  SKIN: Warm and dry LOWER EXTREMITIES: no swelling NEUROLOGIC:  Alert and oriented x 3 PSYCHIATRIC:  Normal affect   ASSESSMENT:    1. PAF (paroxysmal atrial fibrillation) (HCC)    2. Atypical chest pain   3. Bradycardia   4. Pacemaker    PLAN:    In order of problems listed above:  1. Paroxysmal atrial fibrillation: Denies having any palpitation lately, on sotalol, not anticoagulant because of fragile state.  We will continue present management. 2. Atypical chest pain.  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  She does not want to do any aggressive test at this stage. 3. Bradycardia that being addressed with a pacemaker. 4. Pacemaker present: Followed by our EP team.  Last interrogation from March 15, 2020   Medication Adjustments/Labs and Tests Ordered: Current medicines are reviewed at length with the patient today.  Concerns regarding medicines are outlined above.  Orders Placed This Encounter  Procedures  . EKG 12-Lead   Medication changes: No orders of the defined types were placed in this encounter.   Signed, Georgeanna Lea, MD, [facility] 05/27/2020 12:04 PM    [facility]

## 2020-05-31 ENCOUNTER — Telehealth: Payer: Self-pay | Admitting: Gastroenterology

## 2020-05-31 MED ORDER — DICYCLOMINE HCL 10 MG PO CAPS: 10.0000 mg | ORAL_CAPSULE | Freq: Two times a day (BID) | ORAL | 0 refills | Status: DC

## 2020-05-31 NOTE — Telephone Encounter (Signed)
Spoke to patient who states that her reflux has been getting worse over the past few weeks. We went over her medications and discovered that she did not relize she should be taking Nexium 40 mg at noon or Bentyl twice daily. Patient will start taking her medication as directed. She will eat 6 small meals a day instead of the two lage meals she has been eating. She will stop drinking her sodas and sweet tea, and drink water to see if this helps her symptoms. All questions answered patient voiced understanding. She will contact the office if symptoms do not improve.

## 2020-05-31 NOTE — Telephone Encounter (Signed)
I have sent refill to patients pharmacy, patient notified.   

## 2020-06-02 DIAGNOSIS — G629 Polyneuropathy, unspecified: Secondary | ICD-10-CM | POA: Diagnosis not present

## 2020-06-02 DIAGNOSIS — E559 Vitamin D deficiency, unspecified: Secondary | ICD-10-CM | POA: Diagnosis not present

## 2020-06-02 DIAGNOSIS — Z9181 History of falling: Secondary | ICD-10-CM | POA: Diagnosis not present

## 2020-06-02 DIAGNOSIS — E785 Hyperlipidemia, unspecified: Secondary | ICD-10-CM | POA: Diagnosis not present

## 2020-06-02 DIAGNOSIS — R300 Dysuria: Secondary | ICD-10-CM | POA: Diagnosis not present

## 2020-06-02 DIAGNOSIS — I495 Sick sinus syndrome: Secondary | ICD-10-CM | POA: Diagnosis not present

## 2020-06-02 DIAGNOSIS — D692 Other nonthrombocytopenic purpura: Secondary | ICD-10-CM | POA: Diagnosis not present

## 2020-06-02 DIAGNOSIS — I4891 Unspecified atrial fibrillation: Secondary | ICD-10-CM | POA: Diagnosis not present

## 2020-06-02 DIAGNOSIS — K219 Gastro-esophageal reflux disease without esophagitis: Secondary | ICD-10-CM | POA: Diagnosis not present

## 2020-06-08 ENCOUNTER — Telehealth: Payer: Self-pay | Admitting: Gastroenterology

## 2020-06-08 DIAGNOSIS — L989 Disorder of the skin and subcutaneous tissue, unspecified: Secondary | ICD-10-CM | POA: Diagnosis not present

## 2020-06-08 NOTE — Telephone Encounter (Signed)
Patient is calling, she states that she has some questions regarding medications she is on. states she is on a lot of medications and she is confused on the ones Dr. Chales Abrahams has given her. Asking for a call back after 1:30, states she is having to go out of the house and will not return until then and her VM can not get messages.

## 2020-06-08 NOTE — Telephone Encounter (Signed)
I have called and went over her medications with her, patient voiced understanding on how to take all the medications that Dr. Chales Abrahams has prescribed.

## 2020-06-14 DIAGNOSIS — M79676 Pain in unspecified toe(s): Secondary | ICD-10-CM | POA: Diagnosis not present

## 2020-06-14 DIAGNOSIS — L84 Corns and callosities: Secondary | ICD-10-CM | POA: Diagnosis not present

## 2020-06-29 DIAGNOSIS — R197 Diarrhea, unspecified: Secondary | ICD-10-CM | POA: Diagnosis not present

## 2020-06-29 DIAGNOSIS — Z79899 Other long term (current) drug therapy: Secondary | ICD-10-CM | POA: Diagnosis not present

## 2020-07-01 DIAGNOSIS — Z23 Encounter for immunization: Secondary | ICD-10-CM | POA: Diagnosis not present

## 2020-07-01 DIAGNOSIS — F331 Major depressive disorder, recurrent, moderate: Secondary | ICD-10-CM | POA: Diagnosis not present

## 2020-07-01 DIAGNOSIS — Z681 Body mass index (BMI) 19 or less, adult: Secondary | ICD-10-CM | POA: Diagnosis not present

## 2020-07-01 DIAGNOSIS — N390 Urinary tract infection, site not specified: Secondary | ICD-10-CM | POA: Diagnosis not present

## 2020-07-02 ENCOUNTER — Telehealth: Payer: Self-pay | Admitting: Emergency Medicine

## 2020-07-02 NOTE — Telephone Encounter (Signed)
Called patient. Informed her we were calling to check on her. She states she is doing fine. Advised her to call if she needs anything. She verbally understood. No further questions.

## 2020-07-05 DIAGNOSIS — R399 Unspecified symptoms and signs involving the genitourinary system: Secondary | ICD-10-CM | POA: Diagnosis not present

## 2020-07-05 DIAGNOSIS — B373 Candidiasis of vulva and vagina: Secondary | ICD-10-CM | POA: Diagnosis not present

## 2020-07-06 DIAGNOSIS — R399 Unspecified symptoms and signs involving the genitourinary system: Secondary | ICD-10-CM | POA: Diagnosis not present

## 2020-07-06 DIAGNOSIS — N3020 Other chronic cystitis without hematuria: Secondary | ICD-10-CM | POA: Diagnosis not present

## 2020-07-15 DIAGNOSIS — F331 Major depressive disorder, recurrent, moderate: Secondary | ICD-10-CM | POA: Diagnosis not present

## 2020-07-19 DIAGNOSIS — N390 Urinary tract infection, site not specified: Secondary | ICD-10-CM | POA: Diagnosis not present

## 2020-07-29 DIAGNOSIS — L820 Inflamed seborrheic keratosis: Secondary | ICD-10-CM | POA: Diagnosis not present

## 2020-07-29 DIAGNOSIS — N390 Urinary tract infection, site not specified: Secondary | ICD-10-CM | POA: Diagnosis not present

## 2020-07-29 DIAGNOSIS — L209 Atopic dermatitis, unspecified: Secondary | ICD-10-CM | POA: Diagnosis not present

## 2020-07-29 DIAGNOSIS — M810 Age-related osteoporosis without current pathological fracture: Secondary | ICD-10-CM | POA: Diagnosis not present

## 2020-08-03 ENCOUNTER — Other Ambulatory Visit: Payer: Self-pay

## 2020-08-03 ENCOUNTER — Encounter: Payer: Self-pay | Admitting: Cardiology

## 2020-08-03 ENCOUNTER — Ambulatory Visit: Payer: Medicare HMO | Admitting: Cardiology

## 2020-08-03 VITALS — BP 120/58 | HR 70 | Ht 60.0 in | Wt 90.6 lb

## 2020-08-03 DIAGNOSIS — I480 Paroxysmal atrial fibrillation: Secondary | ICD-10-CM

## 2020-08-03 DIAGNOSIS — R0789 Other chest pain: Secondary | ICD-10-CM | POA: Diagnosis not present

## 2020-08-03 DIAGNOSIS — Z950 Presence of cardiac pacemaker: Secondary | ICD-10-CM

## 2020-08-03 DIAGNOSIS — R001 Bradycardia, unspecified: Secondary | ICD-10-CM | POA: Diagnosis not present

## 2020-08-03 DIAGNOSIS — E7800 Pure hypercholesterolemia, unspecified: Secondary | ICD-10-CM | POA: Diagnosis not present

## 2020-08-03 NOTE — Patient Instructions (Signed)
Medication Instructions:  Your physician recommends that you continue on your current medications as directed. Please refer to the Current Medication list given to you today.  *If you need a refill on your cardiac medications before your next appointment, please call your pharmacy*   Lab Work: None. If you have labs (blood work) drawn today and your tests are completely normal, you will receive your results only by: . MyChart Message (if you have MyChart) OR . A paper copy in the mail If you have any lab test that is abnormal or we need to change your treatment, we will call you to review the results.   Testing/Procedures: none   Follow-Up: At [facility], you and your health needs are our priority.  As part of our continuing mission to provide you with exceptional heart care, we have created designated Provider Care Teams.  These Care Teams include your primary Cardiologist (physician) and Advanced Practice Providers (APPs -  Physician Assistants and Nurse Practitioners) who all work together to provide you with the care you need, when you need it.  We recommend signing up for the patient portal called "MyChart".  Sign up information is provided on this After Visit Summary.  MyChart is used to connect with patients for Virtual Visits (Telemedicine).  Patients are able to view lab/test results, encounter notes, upcoming appointments, etc.  Non-urgent messages can be sent to your provider as well.   To learn more about what you can do with MyChart, go to [email or web address].    Your next appointment:   5 month(s)  The format for your next appointment:   In Person  Provider:   Robert Krasowski, MD   Other Instructions   

## 2020-08-03 NOTE — Progress Notes (Signed)
Cardiology Office Note:    Date:  08/03/2020   ID:  COREE RIESTER, DOB [birth year], MRN [id number]  PCP:  Wilmer Floor., MD  Cardiologist:  Gypsy Balsam, MD    Referring MD: Wilmer Floor., MD   Chief Complaint  Patient presents with  . Follow-up  Feeling better  History of Present Illness:    Heather Thomas is a 86 y.o. female with past medical history significant for paroxysmal atrial fibrillation, in spite of high CHA2DS2-VASc: She is not anticoagulated secondary to fragile state, renal insufficiency, pacemaker present, bradycardia.  Comes today 2 months for follow-up recently she got some urine tract infection but recovered completely from it.  Denies have any chest pain tightness squeezing pressure burning chest.  Last time we spoke she did have some chest pain we try all different techniques to mitigate this and now she is doing well.  She said she does not have any chest pain.  Story is quite depressing.  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  She lives alone her grandson lives on her property but she goes out traveling for extended period of time up to a week at a time.  She does have life alert but she vividly complain of being lonely.  She does go to church and she got some people visiting her from church.  She lives far in place where there are no neighbors.  Overall she is struggling with this.  Past Medical History:  Diagnosis Date  . A-fib (HCC)   . Acid reflux   . AF (paroxysmal atrial fibrillation) (HCC)   . Anemia   . DJD (degenerative joint disease)   . Osteoporosis   . PMR (polymyalgia rheumatica) (HCC)     Past Surgical History:  Procedure Laterality Date  . ABDOMINAL HYSTERECTOMY    . COLONOSCOPY  07/09/2003   Colonic sigmoid diverticulosis. Otherwise normal attempted colonoscopy to 35cm.   . ESOPHAGOGASTRODUODENOSCOPY  10/15/2014   Erosive gastritis. Otherwise normal EGD,   . INSERT /  REPLACE / REMOVE PACEMAKER     Medtronic    Current Medications: Current Meds  Medication Sig  . aspirin EC 81 MG tablet Take 1 tablet (81 mg total) by mouth daily.  Marland Kitchen denosumab (PROLIA) 60 MG/ML SOSY injection Inject 60 mg into the skin every 6 (six) months. Due 08/2020  . diphenoxylate-atropine (LOMOTIL) 2.5-0.025 MG tablet Take 1 tablet by mouth 2 (two) times daily as needed for diarrhea or loose stools.  Marland Kitchen esomeprazole (NEXIUM) 40 MG capsule Take 1 capsule (40 mg total) by mouth daily at 12 noon.  . nitroGLYCERIN (NITROSTAT) 0.4 MG SL tablet Place 1 tablet (0.4 mg total) under the tongue every 5 (five) minutes as needed for chest pain.  Marland Kitchen ondansetron (ZOFRAN ODT) 4 MG disintegrating tablet Take 1 tablet (4 mg total) by mouth every 8 (eight) hours as needed for nausea or vomiting.  . RABEprazole (ACIPHEX) 20 MG tablet Take 1 tablet (20 mg total) by mouth daily.  . sotalol (BETAPACE) 80 MG tablet TAKE 1/2 TABLET(40 MG) BY MOUTH TWICE DAILY     Allergies:   Codeine   Social History   Socioeconomic History  . Marital status: Married    Spouse name: Not on file  . Number of children: 3  . Years of education: Not on file  . Highest education level: Not on file  Occupational History  . Not on file  Tobacco  Use  . Smoking status: Never Smoker  . Smokeless tobacco: Never Used  Vaping Use  . Vaping Use: Never used  Substance and Sexual Activity  . Alcohol use: No  . Drug use: No  . Sexual activity: Not on file  Other Topics Concern  . Not on file  Social History Narrative  . Not on file   Social Determinants of Health   Financial Resource Strain:   . Difficulty of Paying Living Expenses: Not on file  Food Insecurity:   . Worried About Programme researcher, broadcasting/film/video in the Last Year: Not on file  . Ran Out of Food in the Last Year: Not on file  Transportation Needs:   . Lack of Transportation (Medical): Not on file  . Lack of Transportation (Non-Medical): Not on file  Physical  Activity:   . Days of Exercise per Week: Not on file  . Minutes of Exercise per Session: Not on file  Stress:   . Feeling of Stress : Not on file  Social Connections:   . Frequency of Communication with Friends and Family: Not on file  . Frequency of Social Gatherings with Friends and Family: Not on file  . Attends Religious Services: Not on file  . Active Member of Clubs or Organizations: Not on file  . Attends Banker Meetings: Not on file  . Marital Status: Not on file     Family History: The patient's family history includes Cervical cancer in her daughter; Diabetes in her daughter; Heart attack in her mother; Heart disease in her daughter; Lung disease in her son. There is no history of Colon cancer or Esophageal cancer. ROS:   Please see the history of present illness.    All 14 point review of systems negative except as described per history of present illness  EKGs/Labs/Other Studies Reviewed:      Recent Labs: 02/02/2020: BUN 33; Creatinine, Ser 1.30; Magnesium 2.2; Potassium 5.9; Sodium 137  Recent Lipid Panel No results found for: CHOL, TRIG, HDL, CHOLHDL, VLDL, LDLCALC, LDLDIRECT  Physical Exam:    VS:  BP (!) 120/58 (BP Location: Right Arm, Patient Position: Sitting, Cuff Size: Small)   Pulse 70   Ht 5' (1.524 m)   Wt 90 lb 9.6 oz (41.1 kg)   SpO2 94%   BMI 17.69 kg/m     Wt Readings from Last 3 Encounters:  08/03/20 90 lb 9.6 oz (41.1 kg)  05/27/20 90 lb (40.8 kg)  04/14/20 90 lb (40.8 kg)     GEN:  Well nourished, well developed in no acute distress HEENT: Normal NECK: No JVD; No carotid bruits LYMPHATICS: No lymphadenopathy CARDIAC: RRR, no murmurs, no rubs, no gallops RESPIRATORY:  Clear to auscultation without rales, wheezing or rhonchi  ABDOMEN: Soft, non-tender, non-distended MUSCULOSKELETAL:  No edema; No deformity  SKIN: Warm and dry LOWER EXTREMITIES: no swelling NEUROLOGIC:  Alert and oriented x 3 PSYCHIATRIC:  Normal affect    ASSESSMENT:    1. PAF (paroxysmal atrial fibrillation) (HCC)   2. Pacemaker   3. Hypercholesteremia   4. Bradycardia   5. Atypical chest pain    PLAN:    In order of problems listed above:  1. Paroxysmal atrial fibrillation not anticoagulated denies having any palpitations. 2. Pacemaker present this is a Medtronic device I did review interrogation she got 16 months left in the device, we will continue present management. 3. Dyslipidemia she is 86 years old I do have last fasting lipid profile from [facility] done on 04/26/2020 with LDL of 125 HDL 62 I doubt there is any role for statin therapy in somebody 86 years old with HDL of 62. 4. Bradycardia that being addressed with pacemaker 5. Atypical chest pain: Denies having any   Medication Adjustments/Labs and Tests Ordered: Current medicines are reviewed at length with the patient today.  Concerns regarding medicines are outlined above.  No orders of the defined types were placed in this encounter.  Medication changes: No orders of the defined types were placed in this encounter.   Signed, Georgeanna Lea, MD, [facility] 08/03/2020 1:57 PM    [facility]

## 2020-08-09 ENCOUNTER — Telehealth: Payer: Self-pay | Admitting: Cardiology

## 2020-08-09 NOTE — Telephone Encounter (Signed)
No, she does not need to take that medication.

## 2020-08-09 NOTE — Telephone Encounter (Signed)
Spoke to patient just now and let her know that Dr. Bing Matter said she does not need to take this medication. She verbalizes understanding and thanks me for the call back.

## 2020-08-09 NOTE — Telephone Encounter (Signed)
Pt c/o medication issue:  1. Name of Medication: Ranolazine 500mg   2. How are you currently taking this medication (dosage and times per day)? N/a  3. Are you having a reaction (difficulty breathing--STAT)? no  4. What is your medication issue? Patient states that she found this medication and it has never been open. She wants to know if she was supposed to be taking this medication. I don't see it on her medication list. Please advise.

## 2020-09-12 DIAGNOSIS — S72001A Fracture of unspecified part of neck of right femur, initial encounter for closed fracture: Secondary | ICD-10-CM | POA: Diagnosis not present

## 2020-09-12 DIAGNOSIS — S72141A Displaced intertrochanteric fracture of right femur, initial encounter for closed fracture: Secondary | ICD-10-CM | POA: Diagnosis not present

## 2020-09-12 DIAGNOSIS — Z950 Presence of cardiac pacemaker: Secondary | ICD-10-CM | POA: Diagnosis not present

## 2020-09-12 DIAGNOSIS — I482 Chronic atrial fibrillation, unspecified: Secondary | ICD-10-CM | POA: Diagnosis not present

## 2020-09-12 DIAGNOSIS — I4891 Unspecified atrial fibrillation: Secondary | ICD-10-CM | POA: Diagnosis not present

## 2020-09-12 DIAGNOSIS — S0990XA Unspecified injury of head, initial encounter: Secondary | ICD-10-CM | POA: Diagnosis not present

## 2020-09-12 DIAGNOSIS — Z043 Encounter for examination and observation following other accident: Secondary | ICD-10-CM | POA: Diagnosis not present

## 2020-09-12 DIAGNOSIS — F32A Depression, unspecified: Secondary | ICD-10-CM | POA: Diagnosis not present

## 2020-09-12 DIAGNOSIS — D62 Acute posthemorrhagic anemia: Secondary | ICD-10-CM | POA: Diagnosis not present

## 2020-09-12 DIAGNOSIS — F419 Anxiety disorder, unspecified: Secondary | ICD-10-CM | POA: Diagnosis not present

## 2020-09-12 DIAGNOSIS — I509 Heart failure, unspecified: Secondary | ICD-10-CM | POA: Diagnosis not present

## 2020-09-12 DIAGNOSIS — J439 Emphysema, unspecified: Secondary | ICD-10-CM | POA: Diagnosis not present

## 2020-09-12 DIAGNOSIS — M7989 Other specified soft tissue disorders: Secondary | ICD-10-CM | POA: Diagnosis not present

## 2020-09-12 DIAGNOSIS — I110 Hypertensive heart disease with heart failure: Secondary | ICD-10-CM | POA: Diagnosis not present

## 2020-09-12 DIAGNOSIS — R52 Pain, unspecified: Secondary | ICD-10-CM | POA: Diagnosis not present

## 2020-09-12 DIAGNOSIS — Z7401 Bed confinement status: Secondary | ICD-10-CM | POA: Diagnosis not present

## 2020-09-12 DIAGNOSIS — W19XXXA Unspecified fall, initial encounter: Secondary | ICD-10-CM | POA: Diagnosis not present

## 2020-09-12 DIAGNOSIS — Z01818 Encounter for other preprocedural examination: Secondary | ICD-10-CM | POA: Diagnosis not present

## 2020-09-12 DIAGNOSIS — S72141D Displaced intertrochanteric fracture of right femur, subsequent encounter for closed fracture with routine healing: Secondary | ICD-10-CM | POA: Diagnosis not present

## 2020-09-12 DIAGNOSIS — R0902 Hypoxemia: Secondary | ICD-10-CM | POA: Diagnosis not present

## 2020-09-12 DIAGNOSIS — I10 Essential (primary) hypertension: Secondary | ICD-10-CM | POA: Diagnosis not present

## 2020-09-12 DIAGNOSIS — M160 Bilateral primary osteoarthritis of hip: Secondary | ICD-10-CM | POA: Diagnosis not present

## 2020-09-12 DIAGNOSIS — J449 Chronic obstructive pulmonary disease, unspecified: Secondary | ICD-10-CM | POA: Diagnosis not present

## 2020-09-16 ENCOUNTER — Ambulatory Visit: Payer: Medicare HMO | Admitting: Cardiology

## 2020-09-17 DIAGNOSIS — Z7401 Bed confinement status: Secondary | ICD-10-CM | POA: Diagnosis not present

## 2020-09-17 DIAGNOSIS — D62 Acute posthemorrhagic anemia: Secondary | ICD-10-CM | POA: Diagnosis not present

## 2020-09-17 DIAGNOSIS — N390 Urinary tract infection, site not specified: Secondary | ICD-10-CM | POA: Diagnosis not present

## 2020-09-17 DIAGNOSIS — M810 Age-related osteoporosis without current pathological fracture: Secondary | ICD-10-CM | POA: Diagnosis not present

## 2020-09-17 DIAGNOSIS — I482 Chronic atrial fibrillation, unspecified: Secondary | ICD-10-CM | POA: Diagnosis not present

## 2020-09-17 DIAGNOSIS — R262 Difficulty in walking, not elsewhere classified: Secondary | ICD-10-CM | POA: Diagnosis not present

## 2020-09-17 DIAGNOSIS — G4700 Insomnia, unspecified: Secondary | ICD-10-CM | POA: Diagnosis not present

## 2020-09-17 DIAGNOSIS — S72141A Displaced intertrochanteric fracture of right femur, initial encounter for closed fracture: Secondary | ICD-10-CM | POA: Diagnosis not present

## 2020-09-17 DIAGNOSIS — I10 Essential (primary) hypertension: Secondary | ICD-10-CM | POA: Diagnosis not present

## 2020-09-17 DIAGNOSIS — F419 Anxiety disorder, unspecified: Secondary | ICD-10-CM | POA: Diagnosis not present

## 2020-09-17 DIAGNOSIS — D649 Anemia, unspecified: Secondary | ICD-10-CM | POA: Diagnosis not present

## 2020-09-17 DIAGNOSIS — R0902 Hypoxemia: Secondary | ICD-10-CM | POA: Diagnosis not present

## 2020-09-17 DIAGNOSIS — F4321 Adjustment disorder with depressed mood: Secondary | ICD-10-CM | POA: Diagnosis not present

## 2020-09-17 DIAGNOSIS — S72141D Displaced intertrochanteric fracture of right femur, subsequent encounter for closed fracture with routine healing: Secondary | ICD-10-CM | POA: Diagnosis not present

## 2020-09-21 DIAGNOSIS — S72141D Displaced intertrochanteric fracture of right femur, subsequent encounter for closed fracture with routine healing: Secondary | ICD-10-CM | POA: Diagnosis not present

## 2020-09-21 DIAGNOSIS — R262 Difficulty in walking, not elsewhere classified: Secondary | ICD-10-CM | POA: Diagnosis not present

## 2020-09-21 DIAGNOSIS — D649 Anemia, unspecified: Secondary | ICD-10-CM | POA: Diagnosis not present

## 2020-09-21 DIAGNOSIS — M810 Age-related osteoporosis without current pathological fracture: Secondary | ICD-10-CM | POA: Diagnosis not present

## 2020-10-02 DIAGNOSIS — N390 Urinary tract infection, site not specified: Secondary | ICD-10-CM | POA: Diagnosis not present

## 2020-10-09 ENCOUNTER — Other Ambulatory Visit: Payer: Self-pay | Admitting: Cardiology

## 2020-10-09 DIAGNOSIS — I4891 Unspecified atrial fibrillation: Secondary | ICD-10-CM | POA: Diagnosis not present

## 2020-10-09 DIAGNOSIS — F419 Anxiety disorder, unspecified: Secondary | ICD-10-CM | POA: Diagnosis not present

## 2020-10-09 DIAGNOSIS — D649 Anemia, unspecified: Secondary | ICD-10-CM | POA: Diagnosis not present

## 2020-10-09 DIAGNOSIS — E785 Hyperlipidemia, unspecified: Secondary | ICD-10-CM | POA: Diagnosis not present

## 2020-10-09 DIAGNOSIS — H9190 Unspecified hearing loss, unspecified ear: Secondary | ICD-10-CM | POA: Diagnosis not present

## 2020-10-09 DIAGNOSIS — S72141D Displaced intertrochanteric fracture of right femur, subsequent encounter for closed fracture with routine healing: Secondary | ICD-10-CM | POA: Diagnosis not present

## 2020-10-09 DIAGNOSIS — M80051D Age-related osteoporosis with current pathological fracture, right femur, subsequent encounter for fracture with routine healing: Secondary | ICD-10-CM | POA: Diagnosis not present

## 2020-10-09 DIAGNOSIS — I509 Heart failure, unspecified: Secondary | ICD-10-CM | POA: Diagnosis not present

## 2020-10-09 DIAGNOSIS — E46 Unspecified protein-calorie malnutrition: Secondary | ICD-10-CM | POA: Diagnosis not present

## 2020-10-09 DIAGNOSIS — I110 Hypertensive heart disease with heart failure: Secondary | ICD-10-CM | POA: Diagnosis not present

## 2020-10-11 ENCOUNTER — Telehealth: Payer: Self-pay | Admitting: Cardiology

## 2020-10-11 DIAGNOSIS — I509 Heart failure, unspecified: Secondary | ICD-10-CM | POA: Diagnosis not present

## 2020-10-11 DIAGNOSIS — H9190 Unspecified hearing loss, unspecified ear: Secondary | ICD-10-CM | POA: Diagnosis not present

## 2020-10-11 DIAGNOSIS — E785 Hyperlipidemia, unspecified: Secondary | ICD-10-CM | POA: Diagnosis not present

## 2020-10-11 DIAGNOSIS — D649 Anemia, unspecified: Secondary | ICD-10-CM | POA: Diagnosis not present

## 2020-10-11 DIAGNOSIS — E46 Unspecified protein-calorie malnutrition: Secondary | ICD-10-CM | POA: Diagnosis not present

## 2020-10-11 DIAGNOSIS — M80051D Age-related osteoporosis with current pathological fracture, right femur, subsequent encounter for fracture with routine healing: Secondary | ICD-10-CM | POA: Diagnosis not present

## 2020-10-11 DIAGNOSIS — I4891 Unspecified atrial fibrillation: Secondary | ICD-10-CM | POA: Diagnosis not present

## 2020-10-11 DIAGNOSIS — F419 Anxiety disorder, unspecified: Secondary | ICD-10-CM | POA: Diagnosis not present

## 2020-10-11 DIAGNOSIS — I110 Hypertensive heart disease with heart failure: Secondary | ICD-10-CM | POA: Diagnosis not present

## 2020-10-11 NOTE — Telephone Encounter (Signed)
Called and spoke to [place] with home health. She wanted to confirm patient is supposed to take sotalol 40 mg twice daily I confirmed that in her med list for [place]. She reports patient blood pressure is 110/60 and 100/60 when standing no complaints of dizziness. She wants to make sure Dr. Bevelyn Ngo is ok with her continuing sotalol. I advised her yes and if pressures get lower or patient begins having issues to let us know. No further questions. Will route to Dr. Bing Matter for him to advise further if needed.

## 2020-10-11 NOTE — Telephone Encounter (Signed)
Lawson Fiscal from [facility] is calling to go over medication list with nurse.

## 2020-10-12 DIAGNOSIS — M80051D Age-related osteoporosis with current pathological fracture, right femur, subsequent encounter for fracture with routine healing: Secondary | ICD-10-CM | POA: Diagnosis not present

## 2020-10-12 DIAGNOSIS — E46 Unspecified protein-calorie malnutrition: Secondary | ICD-10-CM | POA: Diagnosis not present

## 2020-10-12 DIAGNOSIS — I4891 Unspecified atrial fibrillation: Secondary | ICD-10-CM | POA: Diagnosis not present

## 2020-10-12 DIAGNOSIS — D649 Anemia, unspecified: Secondary | ICD-10-CM | POA: Diagnosis not present

## 2020-10-12 DIAGNOSIS — I509 Heart failure, unspecified: Secondary | ICD-10-CM | POA: Diagnosis not present

## 2020-10-12 DIAGNOSIS — H9190 Unspecified hearing loss, unspecified ear: Secondary | ICD-10-CM | POA: Diagnosis not present

## 2020-10-12 DIAGNOSIS — I110 Hypertensive heart disease with heart failure: Secondary | ICD-10-CM | POA: Diagnosis not present

## 2020-10-12 DIAGNOSIS — F419 Anxiety disorder, unspecified: Secondary | ICD-10-CM | POA: Diagnosis not present

## 2020-10-12 DIAGNOSIS — E785 Hyperlipidemia, unspecified: Secondary | ICD-10-CM | POA: Diagnosis not present

## 2020-10-12 NOTE — Telephone Encounter (Signed)
Yes, please continue with sotalol

## 2020-10-14 DIAGNOSIS — D649 Anemia, unspecified: Secondary | ICD-10-CM | POA: Diagnosis not present

## 2020-10-14 DIAGNOSIS — M80051D Age-related osteoporosis with current pathological fracture, right femur, subsequent encounter for fracture with routine healing: Secondary | ICD-10-CM | POA: Diagnosis not present

## 2020-10-14 DIAGNOSIS — F419 Anxiety disorder, unspecified: Secondary | ICD-10-CM | POA: Diagnosis not present

## 2020-10-14 DIAGNOSIS — H9190 Unspecified hearing loss, unspecified ear: Secondary | ICD-10-CM | POA: Diagnosis not present

## 2020-10-14 DIAGNOSIS — I509 Heart failure, unspecified: Secondary | ICD-10-CM | POA: Diagnosis not present

## 2020-10-14 DIAGNOSIS — I4891 Unspecified atrial fibrillation: Secondary | ICD-10-CM | POA: Diagnosis not present

## 2020-10-14 DIAGNOSIS — E46 Unspecified protein-calorie malnutrition: Secondary | ICD-10-CM | POA: Diagnosis not present

## 2020-10-14 DIAGNOSIS — E785 Hyperlipidemia, unspecified: Secondary | ICD-10-CM | POA: Diagnosis not present

## 2020-10-14 DIAGNOSIS — I110 Hypertensive heart disease with heart failure: Secondary | ICD-10-CM | POA: Diagnosis not present

## 2020-10-15 DIAGNOSIS — D649 Anemia, unspecified: Secondary | ICD-10-CM | POA: Diagnosis not present

## 2020-10-15 DIAGNOSIS — E785 Hyperlipidemia, unspecified: Secondary | ICD-10-CM | POA: Diagnosis not present

## 2020-10-15 DIAGNOSIS — H9190 Unspecified hearing loss, unspecified ear: Secondary | ICD-10-CM | POA: Diagnosis not present

## 2020-10-15 DIAGNOSIS — F419 Anxiety disorder, unspecified: Secondary | ICD-10-CM | POA: Diagnosis not present

## 2020-10-15 DIAGNOSIS — I509 Heart failure, unspecified: Secondary | ICD-10-CM | POA: Diagnosis not present

## 2020-10-15 DIAGNOSIS — M80821D Other osteoporosis with current pathological fracture, right humerus, subsequent encounter for fracture with routine healing: Secondary | ICD-10-CM | POA: Diagnosis not present

## 2020-10-15 DIAGNOSIS — I4891 Unspecified atrial fibrillation: Secondary | ICD-10-CM | POA: Diagnosis not present

## 2020-10-15 DIAGNOSIS — M80051D Age-related osteoporosis with current pathological fracture, right femur, subsequent encounter for fracture with routine healing: Secondary | ICD-10-CM | POA: Diagnosis not present

## 2020-10-15 DIAGNOSIS — I110 Hypertensive heart disease with heart failure: Secondary | ICD-10-CM | POA: Diagnosis not present

## 2020-10-15 DIAGNOSIS — E46 Unspecified protein-calorie malnutrition: Secondary | ICD-10-CM | POA: Diagnosis not present

## 2020-10-20 DIAGNOSIS — H9190 Unspecified hearing loss, unspecified ear: Secondary | ICD-10-CM | POA: Diagnosis not present

## 2020-10-20 DIAGNOSIS — M80051D Age-related osteoporosis with current pathological fracture, right femur, subsequent encounter for fracture with routine healing: Secondary | ICD-10-CM | POA: Diagnosis not present

## 2020-10-20 DIAGNOSIS — E785 Hyperlipidemia, unspecified: Secondary | ICD-10-CM | POA: Diagnosis not present

## 2020-10-20 DIAGNOSIS — D649 Anemia, unspecified: Secondary | ICD-10-CM | POA: Diagnosis not present

## 2020-10-20 DIAGNOSIS — E46 Unspecified protein-calorie malnutrition: Secondary | ICD-10-CM | POA: Diagnosis not present

## 2020-10-20 DIAGNOSIS — F419 Anxiety disorder, unspecified: Secondary | ICD-10-CM | POA: Diagnosis not present

## 2020-10-20 DIAGNOSIS — I509 Heart failure, unspecified: Secondary | ICD-10-CM | POA: Diagnosis not present

## 2020-10-20 DIAGNOSIS — I4891 Unspecified atrial fibrillation: Secondary | ICD-10-CM | POA: Diagnosis not present

## 2020-10-20 DIAGNOSIS — I110 Hypertensive heart disease with heart failure: Secondary | ICD-10-CM | POA: Diagnosis not present

## 2020-10-22 DIAGNOSIS — E46 Unspecified protein-calorie malnutrition: Secondary | ICD-10-CM | POA: Diagnosis not present

## 2020-10-22 DIAGNOSIS — M80051D Age-related osteoporosis with current pathological fracture, right femur, subsequent encounter for fracture with routine healing: Secondary | ICD-10-CM | POA: Diagnosis not present

## 2020-10-22 DIAGNOSIS — I509 Heart failure, unspecified: Secondary | ICD-10-CM | POA: Diagnosis not present

## 2020-10-22 DIAGNOSIS — I110 Hypertensive heart disease with heart failure: Secondary | ICD-10-CM | POA: Diagnosis not present

## 2020-10-22 DIAGNOSIS — D649 Anemia, unspecified: Secondary | ICD-10-CM | POA: Diagnosis not present

## 2020-10-22 DIAGNOSIS — F419 Anxiety disorder, unspecified: Secondary | ICD-10-CM | POA: Diagnosis not present

## 2020-10-22 DIAGNOSIS — E785 Hyperlipidemia, unspecified: Secondary | ICD-10-CM | POA: Diagnosis not present

## 2020-10-22 DIAGNOSIS — H9190 Unspecified hearing loss, unspecified ear: Secondary | ICD-10-CM | POA: Diagnosis not present

## 2020-10-22 DIAGNOSIS — I4891 Unspecified atrial fibrillation: Secondary | ICD-10-CM | POA: Diagnosis not present

## 2020-10-25 DIAGNOSIS — D649 Anemia, unspecified: Secondary | ICD-10-CM | POA: Diagnosis not present

## 2020-10-25 DIAGNOSIS — I110 Hypertensive heart disease with heart failure: Secondary | ICD-10-CM | POA: Diagnosis not present

## 2020-10-25 DIAGNOSIS — H9190 Unspecified hearing loss, unspecified ear: Secondary | ICD-10-CM | POA: Diagnosis not present

## 2020-10-25 DIAGNOSIS — M80051D Age-related osteoporosis with current pathological fracture, right femur, subsequent encounter for fracture with routine healing: Secondary | ICD-10-CM | POA: Diagnosis not present

## 2020-10-25 DIAGNOSIS — F419 Anxiety disorder, unspecified: Secondary | ICD-10-CM | POA: Diagnosis not present

## 2020-10-25 DIAGNOSIS — E46 Unspecified protein-calorie malnutrition: Secondary | ICD-10-CM | POA: Diagnosis not present

## 2020-10-25 DIAGNOSIS — E785 Hyperlipidemia, unspecified: Secondary | ICD-10-CM | POA: Diagnosis not present

## 2020-10-25 DIAGNOSIS — I4891 Unspecified atrial fibrillation: Secondary | ICD-10-CM | POA: Diagnosis not present

## 2020-10-25 DIAGNOSIS — I509 Heart failure, unspecified: Secondary | ICD-10-CM | POA: Diagnosis not present

## 2020-10-28 DIAGNOSIS — E785 Hyperlipidemia, unspecified: Secondary | ICD-10-CM | POA: Diagnosis not present

## 2020-10-28 DIAGNOSIS — D649 Anemia, unspecified: Secondary | ICD-10-CM | POA: Diagnosis not present

## 2020-10-28 DIAGNOSIS — Z79899 Other long term (current) drug therapy: Secondary | ICD-10-CM | POA: Diagnosis not present

## 2020-10-28 DIAGNOSIS — I495 Sick sinus syndrome: Secondary | ICD-10-CM | POA: Diagnosis not present

## 2020-10-28 DIAGNOSIS — F419 Anxiety disorder, unspecified: Secondary | ICD-10-CM | POA: Diagnosis not present

## 2020-10-28 DIAGNOSIS — H9190 Unspecified hearing loss, unspecified ear: Secondary | ICD-10-CM | POA: Diagnosis not present

## 2020-10-28 DIAGNOSIS — I110 Hypertensive heart disease with heart failure: Secondary | ICD-10-CM | POA: Diagnosis not present

## 2020-10-28 DIAGNOSIS — N183 Chronic kidney disease, stage 3 unspecified: Secondary | ICD-10-CM | POA: Diagnosis not present

## 2020-10-28 DIAGNOSIS — E46 Unspecified protein-calorie malnutrition: Secondary | ICD-10-CM | POA: Diagnosis not present

## 2020-10-28 DIAGNOSIS — D692 Other nonthrombocytopenic purpura: Secondary | ICD-10-CM | POA: Diagnosis not present

## 2020-10-28 DIAGNOSIS — I4891 Unspecified atrial fibrillation: Secondary | ICD-10-CM | POA: Diagnosis not present

## 2020-10-28 DIAGNOSIS — M80051D Age-related osteoporosis with current pathological fracture, right femur, subsequent encounter for fracture with routine healing: Secondary | ICD-10-CM | POA: Diagnosis not present

## 2020-10-28 DIAGNOSIS — F331 Major depressive disorder, recurrent, moderate: Secondary | ICD-10-CM | POA: Diagnosis not present

## 2020-10-28 DIAGNOSIS — I509 Heart failure, unspecified: Secondary | ICD-10-CM | POA: Diagnosis not present

## 2020-10-28 DIAGNOSIS — F411 Generalized anxiety disorder: Secondary | ICD-10-CM | POA: Diagnosis not present

## 2020-11-01 DIAGNOSIS — H9190 Unspecified hearing loss, unspecified ear: Secondary | ICD-10-CM | POA: Diagnosis not present

## 2020-11-01 DIAGNOSIS — I110 Hypertensive heart disease with heart failure: Secondary | ICD-10-CM | POA: Diagnosis not present

## 2020-11-01 DIAGNOSIS — F419 Anxiety disorder, unspecified: Secondary | ICD-10-CM | POA: Diagnosis not present

## 2020-11-01 DIAGNOSIS — I4891 Unspecified atrial fibrillation: Secondary | ICD-10-CM | POA: Diagnosis not present

## 2020-11-01 DIAGNOSIS — I509 Heart failure, unspecified: Secondary | ICD-10-CM | POA: Diagnosis not present

## 2020-11-01 DIAGNOSIS — D649 Anemia, unspecified: Secondary | ICD-10-CM | POA: Diagnosis not present

## 2020-11-01 DIAGNOSIS — E785 Hyperlipidemia, unspecified: Secondary | ICD-10-CM | POA: Diagnosis not present

## 2020-11-01 DIAGNOSIS — M80051D Age-related osteoporosis with current pathological fracture, right femur, subsequent encounter for fracture with routine healing: Secondary | ICD-10-CM | POA: Diagnosis not present

## 2020-11-01 DIAGNOSIS — E46 Unspecified protein-calorie malnutrition: Secondary | ICD-10-CM | POA: Diagnosis not present

## 2020-11-02 DIAGNOSIS — M80051D Age-related osteoporosis with current pathological fracture, right femur, subsequent encounter for fracture with routine healing: Secondary | ICD-10-CM | POA: Diagnosis not present

## 2020-11-02 DIAGNOSIS — H9190 Unspecified hearing loss, unspecified ear: Secondary | ICD-10-CM | POA: Diagnosis not present

## 2020-11-02 DIAGNOSIS — F419 Anxiety disorder, unspecified: Secondary | ICD-10-CM | POA: Diagnosis not present

## 2020-11-02 DIAGNOSIS — E785 Hyperlipidemia, unspecified: Secondary | ICD-10-CM | POA: Diagnosis not present

## 2020-11-02 DIAGNOSIS — I4891 Unspecified atrial fibrillation: Secondary | ICD-10-CM | POA: Diagnosis not present

## 2020-11-02 DIAGNOSIS — I110 Hypertensive heart disease with heart failure: Secondary | ICD-10-CM | POA: Diagnosis not present

## 2020-11-02 DIAGNOSIS — I509 Heart failure, unspecified: Secondary | ICD-10-CM | POA: Diagnosis not present

## 2020-11-02 DIAGNOSIS — E46 Unspecified protein-calorie malnutrition: Secondary | ICD-10-CM | POA: Diagnosis not present

## 2020-11-02 DIAGNOSIS — D649 Anemia, unspecified: Secondary | ICD-10-CM | POA: Diagnosis not present

## 2020-11-04 DIAGNOSIS — I110 Hypertensive heart disease with heart failure: Secondary | ICD-10-CM | POA: Diagnosis not present

## 2020-11-04 DIAGNOSIS — D649 Anemia, unspecified: Secondary | ICD-10-CM | POA: Diagnosis not present

## 2020-11-04 DIAGNOSIS — H9190 Unspecified hearing loss, unspecified ear: Secondary | ICD-10-CM | POA: Diagnosis not present

## 2020-11-04 DIAGNOSIS — F419 Anxiety disorder, unspecified: Secondary | ICD-10-CM | POA: Diagnosis not present

## 2020-11-04 DIAGNOSIS — I509 Heart failure, unspecified: Secondary | ICD-10-CM | POA: Diagnosis not present

## 2020-11-04 DIAGNOSIS — E785 Hyperlipidemia, unspecified: Secondary | ICD-10-CM | POA: Diagnosis not present

## 2020-11-04 DIAGNOSIS — M80051D Age-related osteoporosis with current pathological fracture, right femur, subsequent encounter for fracture with routine healing: Secondary | ICD-10-CM | POA: Diagnosis not present

## 2020-11-04 DIAGNOSIS — E46 Unspecified protein-calorie malnutrition: Secondary | ICD-10-CM | POA: Diagnosis not present

## 2020-11-04 DIAGNOSIS — I4891 Unspecified atrial fibrillation: Secondary | ICD-10-CM | POA: Diagnosis not present

## 2020-11-08 DIAGNOSIS — I4891 Unspecified atrial fibrillation: Secondary | ICD-10-CM | POA: Diagnosis not present

## 2020-11-08 DIAGNOSIS — I110 Hypertensive heart disease with heart failure: Secondary | ICD-10-CM | POA: Diagnosis not present

## 2020-11-08 DIAGNOSIS — I509 Heart failure, unspecified: Secondary | ICD-10-CM | POA: Diagnosis not present

## 2020-11-08 DIAGNOSIS — H9190 Unspecified hearing loss, unspecified ear: Secondary | ICD-10-CM | POA: Diagnosis not present

## 2020-11-08 DIAGNOSIS — M80051D Age-related osteoporosis with current pathological fracture, right femur, subsequent encounter for fracture with routine healing: Secondary | ICD-10-CM | POA: Diagnosis not present

## 2020-11-08 DIAGNOSIS — F419 Anxiety disorder, unspecified: Secondary | ICD-10-CM | POA: Diagnosis not present

## 2020-11-08 DIAGNOSIS — E785 Hyperlipidemia, unspecified: Secondary | ICD-10-CM | POA: Diagnosis not present

## 2020-11-08 DIAGNOSIS — D649 Anemia, unspecified: Secondary | ICD-10-CM | POA: Diagnosis not present

## 2020-11-08 DIAGNOSIS — E46 Unspecified protein-calorie malnutrition: Secondary | ICD-10-CM | POA: Diagnosis not present

## 2020-11-12 DIAGNOSIS — H9190 Unspecified hearing loss, unspecified ear: Secondary | ICD-10-CM | POA: Diagnosis not present

## 2020-11-12 DIAGNOSIS — M80051D Age-related osteoporosis with current pathological fracture, right femur, subsequent encounter for fracture with routine healing: Secondary | ICD-10-CM | POA: Diagnosis not present

## 2020-11-12 DIAGNOSIS — I4891 Unspecified atrial fibrillation: Secondary | ICD-10-CM | POA: Diagnosis not present

## 2020-11-12 DIAGNOSIS — I110 Hypertensive heart disease with heart failure: Secondary | ICD-10-CM | POA: Diagnosis not present

## 2020-11-12 DIAGNOSIS — D649 Anemia, unspecified: Secondary | ICD-10-CM | POA: Diagnosis not present

## 2020-11-12 DIAGNOSIS — E785 Hyperlipidemia, unspecified: Secondary | ICD-10-CM | POA: Diagnosis not present

## 2020-11-12 DIAGNOSIS — E46 Unspecified protein-calorie malnutrition: Secondary | ICD-10-CM | POA: Diagnosis not present

## 2020-11-12 DIAGNOSIS — F419 Anxiety disorder, unspecified: Secondary | ICD-10-CM | POA: Diagnosis not present

## 2020-11-12 DIAGNOSIS — I509 Heart failure, unspecified: Secondary | ICD-10-CM | POA: Diagnosis not present

## 2020-11-15 DIAGNOSIS — D649 Anemia, unspecified: Secondary | ICD-10-CM | POA: Diagnosis not present

## 2020-11-15 DIAGNOSIS — M80051D Age-related osteoporosis with current pathological fracture, right femur, subsequent encounter for fracture with routine healing: Secondary | ICD-10-CM | POA: Diagnosis not present

## 2020-11-15 DIAGNOSIS — E785 Hyperlipidemia, unspecified: Secondary | ICD-10-CM | POA: Diagnosis not present

## 2020-11-15 DIAGNOSIS — I110 Hypertensive heart disease with heart failure: Secondary | ICD-10-CM | POA: Diagnosis not present

## 2020-11-15 DIAGNOSIS — F419 Anxiety disorder, unspecified: Secondary | ICD-10-CM | POA: Diagnosis not present

## 2020-11-15 DIAGNOSIS — E46 Unspecified protein-calorie malnutrition: Secondary | ICD-10-CM | POA: Diagnosis not present

## 2020-11-15 DIAGNOSIS — I509 Heart failure, unspecified: Secondary | ICD-10-CM | POA: Diagnosis not present

## 2020-11-15 DIAGNOSIS — H9190 Unspecified hearing loss, unspecified ear: Secondary | ICD-10-CM | POA: Diagnosis not present

## 2020-11-15 DIAGNOSIS — I4891 Unspecified atrial fibrillation: Secondary | ICD-10-CM | POA: Diagnosis not present

## 2020-11-17 DIAGNOSIS — I110 Hypertensive heart disease with heart failure: Secondary | ICD-10-CM | POA: Diagnosis not present

## 2020-11-17 DIAGNOSIS — E785 Hyperlipidemia, unspecified: Secondary | ICD-10-CM | POA: Diagnosis not present

## 2020-11-17 DIAGNOSIS — F419 Anxiety disorder, unspecified: Secondary | ICD-10-CM | POA: Diagnosis not present

## 2020-11-17 DIAGNOSIS — M80051D Age-related osteoporosis with current pathological fracture, right femur, subsequent encounter for fracture with routine healing: Secondary | ICD-10-CM | POA: Diagnosis not present

## 2020-11-17 DIAGNOSIS — H9190 Unspecified hearing loss, unspecified ear: Secondary | ICD-10-CM | POA: Diagnosis not present

## 2020-11-17 DIAGNOSIS — I4891 Unspecified atrial fibrillation: Secondary | ICD-10-CM | POA: Diagnosis not present

## 2020-11-17 DIAGNOSIS — I509 Heart failure, unspecified: Secondary | ICD-10-CM | POA: Diagnosis not present

## 2020-11-17 DIAGNOSIS — E46 Unspecified protein-calorie malnutrition: Secondary | ICD-10-CM | POA: Diagnosis not present

## 2020-11-17 DIAGNOSIS — D649 Anemia, unspecified: Secondary | ICD-10-CM | POA: Diagnosis not present

## 2020-11-22 DIAGNOSIS — D649 Anemia, unspecified: Secondary | ICD-10-CM | POA: Diagnosis not present

## 2020-11-22 DIAGNOSIS — F419 Anxiety disorder, unspecified: Secondary | ICD-10-CM | POA: Diagnosis not present

## 2020-11-22 DIAGNOSIS — E785 Hyperlipidemia, unspecified: Secondary | ICD-10-CM | POA: Diagnosis not present

## 2020-11-22 DIAGNOSIS — I509 Heart failure, unspecified: Secondary | ICD-10-CM | POA: Diagnosis not present

## 2020-11-22 DIAGNOSIS — I110 Hypertensive heart disease with heart failure: Secondary | ICD-10-CM | POA: Diagnosis not present

## 2020-11-22 DIAGNOSIS — H9190 Unspecified hearing loss, unspecified ear: Secondary | ICD-10-CM | POA: Diagnosis not present

## 2020-11-22 DIAGNOSIS — E46 Unspecified protein-calorie malnutrition: Secondary | ICD-10-CM | POA: Diagnosis not present

## 2020-11-22 DIAGNOSIS — I4891 Unspecified atrial fibrillation: Secondary | ICD-10-CM | POA: Diagnosis not present

## 2020-11-22 DIAGNOSIS — M80051D Age-related osteoporosis with current pathological fracture, right femur, subsequent encounter for fracture with routine healing: Secondary | ICD-10-CM | POA: Diagnosis not present

## 2020-11-23 DIAGNOSIS — E785 Hyperlipidemia, unspecified: Secondary | ICD-10-CM | POA: Diagnosis not present

## 2020-11-23 DIAGNOSIS — M80051D Age-related osteoporosis with current pathological fracture, right femur, subsequent encounter for fracture with routine healing: Secondary | ICD-10-CM | POA: Diagnosis not present

## 2020-11-23 DIAGNOSIS — F419 Anxiety disorder, unspecified: Secondary | ICD-10-CM | POA: Diagnosis not present

## 2020-11-23 DIAGNOSIS — E46 Unspecified protein-calorie malnutrition: Secondary | ICD-10-CM | POA: Diagnosis not present

## 2020-11-23 DIAGNOSIS — H9190 Unspecified hearing loss, unspecified ear: Secondary | ICD-10-CM | POA: Diagnosis not present

## 2020-11-23 DIAGNOSIS — D649 Anemia, unspecified: Secondary | ICD-10-CM | POA: Diagnosis not present

## 2020-11-23 DIAGNOSIS — I110 Hypertensive heart disease with heart failure: Secondary | ICD-10-CM | POA: Diagnosis not present

## 2020-11-23 DIAGNOSIS — I509 Heart failure, unspecified: Secondary | ICD-10-CM | POA: Diagnosis not present

## 2020-11-23 DIAGNOSIS — I4891 Unspecified atrial fibrillation: Secondary | ICD-10-CM | POA: Diagnosis not present

## 2020-11-25 DIAGNOSIS — S72143A Displaced intertrochanteric fracture of unspecified femur, initial encounter for closed fracture: Secondary | ICD-10-CM | POA: Diagnosis not present

## 2020-11-25 DIAGNOSIS — S72141A Displaced intertrochanteric fracture of right femur, initial encounter for closed fracture: Secondary | ICD-10-CM | POA: Diagnosis not present

## 2020-11-30 DIAGNOSIS — I110 Hypertensive heart disease with heart failure: Secondary | ICD-10-CM | POA: Diagnosis not present

## 2020-11-30 DIAGNOSIS — D649 Anemia, unspecified: Secondary | ICD-10-CM | POA: Diagnosis not present

## 2020-11-30 DIAGNOSIS — E46 Unspecified protein-calorie malnutrition: Secondary | ICD-10-CM | POA: Diagnosis not present

## 2020-11-30 DIAGNOSIS — I4891 Unspecified atrial fibrillation: Secondary | ICD-10-CM | POA: Diagnosis not present

## 2020-11-30 DIAGNOSIS — F419 Anxiety disorder, unspecified: Secondary | ICD-10-CM | POA: Diagnosis not present

## 2020-11-30 DIAGNOSIS — M80051D Age-related osteoporosis with current pathological fracture, right femur, subsequent encounter for fracture with routine healing: Secondary | ICD-10-CM | POA: Diagnosis not present

## 2020-11-30 DIAGNOSIS — H9190 Unspecified hearing loss, unspecified ear: Secondary | ICD-10-CM | POA: Diagnosis not present

## 2020-11-30 DIAGNOSIS — I509 Heart failure, unspecified: Secondary | ICD-10-CM | POA: Diagnosis not present

## 2020-11-30 DIAGNOSIS — E785 Hyperlipidemia, unspecified: Secondary | ICD-10-CM | POA: Diagnosis not present

## 2020-12-01 DIAGNOSIS — H9190 Unspecified hearing loss, unspecified ear: Secondary | ICD-10-CM | POA: Diagnosis not present

## 2020-12-01 DIAGNOSIS — F419 Anxiety disorder, unspecified: Secondary | ICD-10-CM | POA: Diagnosis not present

## 2020-12-01 DIAGNOSIS — M80051D Age-related osteoporosis with current pathological fracture, right femur, subsequent encounter for fracture with routine healing: Secondary | ICD-10-CM | POA: Diagnosis not present

## 2020-12-01 DIAGNOSIS — I110 Hypertensive heart disease with heart failure: Secondary | ICD-10-CM | POA: Diagnosis not present

## 2020-12-01 DIAGNOSIS — I509 Heart failure, unspecified: Secondary | ICD-10-CM | POA: Diagnosis not present

## 2020-12-01 DIAGNOSIS — E785 Hyperlipidemia, unspecified: Secondary | ICD-10-CM | POA: Diagnosis not present

## 2020-12-01 DIAGNOSIS — I4891 Unspecified atrial fibrillation: Secondary | ICD-10-CM | POA: Diagnosis not present

## 2020-12-01 DIAGNOSIS — E46 Unspecified protein-calorie malnutrition: Secondary | ICD-10-CM | POA: Diagnosis not present

## 2020-12-01 DIAGNOSIS — D649 Anemia, unspecified: Secondary | ICD-10-CM | POA: Diagnosis not present

## 2020-12-24 ENCOUNTER — Other Ambulatory Visit: Payer: Self-pay

## 2020-12-24 DIAGNOSIS — M1990 Unspecified osteoarthritis, unspecified site: Secondary | ICD-10-CM | POA: Insufficient documentation

## 2020-12-24 DIAGNOSIS — D649 Anemia, unspecified: Secondary | ICD-10-CM | POA: Insufficient documentation

## 2020-12-24 DIAGNOSIS — M810 Age-related osteoporosis without current pathological fracture: Secondary | ICD-10-CM | POA: Insufficient documentation

## 2020-12-24 DIAGNOSIS — I4891 Unspecified atrial fibrillation: Secondary | ICD-10-CM | POA: Insufficient documentation

## 2020-12-24 DIAGNOSIS — K219 Gastro-esophageal reflux disease without esophagitis: Secondary | ICD-10-CM | POA: Insufficient documentation

## 2020-12-24 DIAGNOSIS — M353 Polymyalgia rheumatica: Secondary | ICD-10-CM | POA: Insufficient documentation

## 2020-12-24 DIAGNOSIS — I480 Paroxysmal atrial fibrillation: Secondary | ICD-10-CM | POA: Insufficient documentation

## 2020-12-27 ENCOUNTER — Ambulatory Visit: Payer: Medicare HMO | Admitting: Cardiology

## 2020-12-27 ENCOUNTER — Encounter: Payer: Self-pay | Admitting: Cardiology

## 2020-12-27 ENCOUNTER — Other Ambulatory Visit: Payer: Self-pay

## 2020-12-27 VITALS — BP 108/50 | HR 63 | Ht 60.0 in | Wt 84.0 lb

## 2020-12-27 DIAGNOSIS — Z950 Presence of cardiac pacemaker: Secondary | ICD-10-CM

## 2020-12-27 DIAGNOSIS — E7800 Pure hypercholesterolemia, unspecified: Secondary | ICD-10-CM | POA: Diagnosis not present

## 2020-12-27 DIAGNOSIS — I480 Paroxysmal atrial fibrillation: Secondary | ICD-10-CM | POA: Diagnosis not present

## 2020-12-27 NOTE — Patient Instructions (Signed)

## 2020-12-27 NOTE — Progress Notes (Signed)
Cardiology Office Note:    Date:  12/27/2020   ID:  Heather Thomas, DOB [birth year], MRN [id number]  PCP:  Wilmer Floor., MD  Cardiologist:  Gypsy Balsam, MD    Referring MD: Wilmer Floor., MD   Chief Complaint  Patient presents with  . Follow-up    History of Present Illness:    Heather Thomas is a 87 y.o. female with past medical history significant for paroxysmal atrial fibrillation, she is not anticoagulated because of fragile state, essential hypertension, pacemaker which is Medtronic device.  She comes today to my office for follow-up.  In December she fell down broke right femur required surgery did quite well still living alone at home.  She does have some person who comes to help her to do shopping and cooking.  But overall after admit she is deteriorating.  She is still sharp with her mind.  Somewhat hard of hearing but it is always enjoyable thing to talk to this nice lady.  Denies have any cardiac complaints there is no chest pain tightness squeezing pressure burning chest  Past Medical History:  Diagnosis Date  . A-fib (HCC)   . Acid reflux   . AF (paroxysmal atrial fibrillation) (HCC)   . Anemia   . Atypical chest pain 03/30/2016  . Bradycardia 08/12/2015  . DJD (degenerative joint disease)   . History of atrial fibrillation 12/05/2018   on pacemaker  . History of esophageal reflux 12/05/2018  . History of osteoporosis 12/05/2018  . History of vertebral fracture 12/05/2018   C1 and C2 fracture s/p fall in 2017 h/o of multiple compression fractures in lumbar region  . Hypercholesteremia 12/05/2018  . Hypertrophic and atrophic condition of skin 10/07/2019  . Ingrowing left great toenail 10/07/2019  . Osteoporosis   . Pacemaker 08/12/2015  . PAF (paroxysmal atrial fibrillation) (HCC) 08/21/2017  . Pain due to onychomycosis of toenails of both feet 10/07/2019  . Plantar callus 10/07/2019  . PMR (polymyalgia rheumatica) (HCC)   . Post-nasal drip 02/21/2018  . Primary  osteoarthritis of both hands 12/05/2018  . Renal insufficiency 12/05/2018  . Vitamin D deficiency 12/05/2018    Past Surgical History:  Procedure Laterality Date  . ABDOMINAL HYSTERECTOMY    . COLONOSCOPY  07/09/2003   Colonic sigmoid diverticulosis. Otherwise normal attempted colonoscopy to 35cm.   . ESOPHAGOGASTRODUODENOSCOPY  10/15/2014   Erosive gastritis. Otherwise normal EGD,   . INSERT / REPLACE / REMOVE PACEMAKER     Medtronic    Current Medications: Current Meds  Medication Sig  . aspirin 325 MG EC tablet Take 325 mg by mouth daily.  Marland Kitchen denosumab (PROLIA) 60 MG/ML SOSY injection Inject 60 mg into the skin every 6 (six) months. Due 08/2020  . esomeprazole (NEXIUM) 40 MG capsule Take 1 capsule (40 mg total) by mouth daily at 12 noon.  . nitroGLYCERIN (NITROSTAT) 0.4 MG SL tablet Place 1 tablet (0.4 mg total) under the tongue every 5 (five) minutes as needed for chest pain.  . sotalol (BETAPACE) 80 MG tablet TAKE 1/2 TABLET(40 MG) BY MOUTH TWICE DAILY (Patient taking differently: Take 80 mg by mouth daily.)  . [DISCONTINUED] aspirin EC 81 MG tablet Take 1 tablet (81 mg total) by mouth daily.     Allergies:   Codeine   Social History   Socioeconomic History  . Marital status: Married    Spouse name: Not on file  . Number of children: 3  . Years of education: Not on file  .  Highest education level: Not on file  Occupational History  . Not on file  Tobacco Use  . Smoking status: Never Smoker  . Smokeless tobacco: Never Used  Vaping Use  . Vaping Use: Never used  Substance and Sexual Activity  . Alcohol use: No  . Drug use: No  . Sexual activity: Not on file  Other Topics Concern  . Not on file  Social History Narrative  . Not on file   Social Determinants of Health   Financial Resource Strain: Not on file  Food Insecurity: Not on file  Transportation Needs: Not on file  Physical Activity: Not on file  Stress: Not on file  Social Connections: Not on file      Family History: The patient's family history includes Cervical cancer in her daughter; Diabetes in her daughter; Heart attack in her mother; Heart disease in her daughter; Lung disease in her son. There is no history of Colon cancer or Esophageal cancer. ROS:   Please see the history of present illness.    All 14 point review of systems negative except as described per history of present illness  EKGs/Labs/Other Studies Reviewed:      Recent Labs: 02/02/2020: BUN 33; Creatinine, Ser 1.30; Magnesium 2.2; Potassium 5.9; Sodium 137  Recent Lipid Panel No results found for: CHOL, TRIG, HDL, CHOLHDL, VLDL, LDLCALC, LDLDIRECT  Physical Exam:    VS:  BP (!) 108/50 (BP Location: Right Arm, Patient Position: Sitting)   Pulse 63   Ht 5' (1.524 m)   Wt 84 lb (38.1 kg)   SpO2 94%   BMI 16.41 kg/m     Wt Readings from Last 3 Encounters:  12/27/20 84 lb (38.1 kg)  08/03/20 90 lb 9.6 oz (41.1 kg)  05/27/20 90 lb (40.8 kg)     GEN:  Well nourished, well developed in no acute distress HEENT: Normal NECK: No JVD; No carotid bruits LYMPHATICS: No lymphadenopathy CARDIAC: RRR, no murmurs, no rubs, no gallops RESPIRATORY:  Clear to auscultation without rales, wheezing or rhonchi  ABDOMEN: Soft, non-tender, non-distended MUSCULOSKELETAL:  No edema; No deformity  SKIN: Warm and dry LOWER EXTREMITIES: no swelling NEUROLOGIC:  Alert and oriented x 3 PSYCHIATRIC:  Normal affect   ASSESSMENT:    1. PAF (paroxysmal atrial fibrillation) (HCC)   2. Pacemaker   3. Hypercholesteremia    PLAN:    In order of problems listed above:  1. Paroxysmal atrial fibrillation since 2 maintaining sinus rhythm she was on high dose of aspirin ask her to discontinue we will continue monitoring.  She is not a candidate for anticoagulation in spite of high CHA2DS2-VASc of because of advanced age and fragility and falls. 2. Pacemaker present I did review her interrogation pacemaker is already 87 years old last  interrogation I see is from June of last year where she got 16 months left in the device however the spread of timing was quite wide between 1 to 31 months.  We will make arrangements for interrogation of her device if will not have interrogation remotely then I will bring her to the office in the interrogation in person. 3. Dyslipidemia.  Her LDL is 125 HDL 62 but with her advanced age I would not augment her therapy.   Medication Adjustments/Labs and Tests Ordered: Current medicines are reviewed at length with the patient today.  Concerns regarding medicines are outlined above.  No orders of the defined types were placed in this encounter.  Medication changes: No orders of the  defined types were placed in this encounter.   Signed, Georgeanna Lea, MD, [facility] 12/27/2020 3:58 PM    [facility]

## 2020-12-28 ENCOUNTER — Telehealth: Payer: Self-pay

## 2020-12-28 NOTE — Telephone Encounter (Signed)
Spoke with patient and her "caregiver"  Pt does have a remote monitor but it is not plugged in.  Requested that patient plug in the monitor so it can charge and we will callback later to assisted with sending a transmission.  Pt refused, stated she wants to be seen in the office.  Advised that I can schedule for device clinic in [place].  Pt stated she cannot come to [facility], she would like to be seen in [place] office to have device interrogated.  Attempted to explain the benefits of having remote monitor and offered that we can walk her through the transmission  Pt again refused to use home monitor.    Advised pt that I will have scheduling contact her to arrange an appointment.

## 2020-12-28 NOTE — Telephone Encounter (Signed)
-----   Message from Baird Lyons, RN sent at 12/27/2020  5:21 PM EDT -----  ----- Message ----- From: Georgeanna Lea, MD Sent: 12/27/2020   4:01 PM EDT To: Baird Lyons, RN  Sherry, could you make arrangement for remote interrogation of this lady device looks like last time it was checked it was in summer of last year with 53-month left however spread was between less than 1 month to 31 months so I think it would be reasonable idea to check it. Thank you

## 2020-12-29 NOTE — Telephone Encounter (Signed)
Will take care of this, I will bring her to the office and have her interrogated here in our office in [place].

## 2020-12-29 NOTE — Telephone Encounter (Signed)
Patient returning call.

## 2020-12-29 NOTE — Telephone Encounter (Signed)
Left message for patient to return call.

## 2020-12-29 NOTE — Telephone Encounter (Signed)
Rolly Salter, could you please make arrangements for her pacemaker to be interrogated.  It is Medtronic device

## 2020-12-29 NOTE — Telephone Encounter (Signed)
Called patient informed her that someone can interrogate her pacemaker tomorrow morning at 1030 am here in [place] she understood no further questions.

## 2021-01-05 DIAGNOSIS — M216X2 Other acquired deformities of left foot: Secondary | ICD-10-CM | POA: Diagnosis not present

## 2021-01-05 DIAGNOSIS — M216X1 Other acquired deformities of right foot: Secondary | ICD-10-CM | POA: Diagnosis not present

## 2021-01-05 DIAGNOSIS — L84 Corns and callosities: Secondary | ICD-10-CM | POA: Diagnosis not present

## 2021-01-05 DIAGNOSIS — M79674 Pain in right toe(s): Secondary | ICD-10-CM | POA: Diagnosis not present

## 2021-01-05 DIAGNOSIS — M79675 Pain in left toe(s): Secondary | ICD-10-CM | POA: Diagnosis not present

## 2021-01-05 DIAGNOSIS — B351 Tinea unguium: Secondary | ICD-10-CM | POA: Diagnosis not present

## 2021-01-06 ENCOUNTER — Telehealth: Payer: Self-pay | Admitting: Gastroenterology

## 2021-01-06 NOTE — Telephone Encounter (Signed)
Inbound call from patient requesting a call back please.  States she has been experiencing abdominal pain; informed patient next availability with Dr. Chales Abrahams will be 01/24/21 but wants to be seen sooner but not with APPs.  Please advise.

## 2021-01-07 NOTE — Telephone Encounter (Signed)
Pt scheduled to see Dr. Chales Abrahams 02/01/21 at 10:40am. Pt aware of appt States she is having some burning/discomfort in her stomach.

## 2021-01-17 NOTE — Telephone Encounter (Signed)
Patient calling back stating her pain has gotten worse and is wanting to know if Dr. Chales Abrahams can prescribe something before her appt.  Please advise.

## 2021-01-17 NOTE — Telephone Encounter (Signed)
Pt states she is having a lot of burning and discomfort in her stomach that moves in to her ribs. Offered pt an appt with an APP, she is going to call her grandson and see if he can bring her to one of the appts tomorrow.

## 2021-01-18 ENCOUNTER — Encounter: Payer: Self-pay | Admitting: Physician Assistant

## 2021-01-18 ENCOUNTER — Other Ambulatory Visit: Payer: Self-pay

## 2021-01-18 ENCOUNTER — Ambulatory Visit (INDEPENDENT_AMBULATORY_CARE_PROVIDER_SITE_OTHER): Payer: Medicare HMO | Admitting: Physician Assistant

## 2021-01-18 VITALS — BP 130/60 | HR 68 | Ht 60.0 in | Wt 82.6 lb

## 2021-01-18 DIAGNOSIS — K581 Irritable bowel syndrome with constipation: Secondary | ICD-10-CM

## 2021-01-18 DIAGNOSIS — K219 Gastro-esophageal reflux disease without esophagitis: Secondary | ICD-10-CM | POA: Diagnosis not present

## 2021-01-18 DIAGNOSIS — R1030 Lower abdominal pain, unspecified: Secondary | ICD-10-CM | POA: Diagnosis not present

## 2021-01-18 MED ORDER — DICYCLOMINE HCL 10 MG PO CAPS
10.0000 mg | ORAL_CAPSULE | Freq: Four times a day (QID) | ORAL | 3 refills | Status: DC | PRN
Start: 2021-01-18 — End: 2021-03-15

## 2021-01-18 MED ORDER — FAMOTIDINE 40 MG PO TABS: 40.0000 mg | ORAL_TABLET | Freq: Every day | ORAL | 3 refills | Status: DC

## 2021-01-18 NOTE — Patient Instructions (Signed)
If you are age 85 or older, your body mass index should be between 23-30. Your Body mass index is 16.13 kg/m. If this is out of the aforementioned range listed, please consider follow up with your Primary Care Provider.  If you are age 46 or younger, your body mass index should be between 19-25. Your Body mass index is 16.13 kg/m. If this is out of the aformentioned range listed, please consider follow up with your Primary Care Provider.   We have sent the following medications to your pharmacy for you to pick up at your convenience: Bentyl 10 mg and Famotidine 40 mg.   Start Align probiotic.  Thank you for choosing me and [facility].  Hyacinth Meeker, PA-C

## 2021-01-18 NOTE — Progress Notes (Signed)
Chief Complaint: Epigastric pain  HPI:    Heather Thomas is a 87 year old female with a past medical history as listed below including reflux, known to Dr. Chales Abrahams, who presents to clinic today with a complaint of epigastric pain.      03/24/2020 patient seen in clinic by Dr. Chales Abrahams for IBS C, reflux and lower abdominal pain which was better with defecation.  At that time taking Aciphex 20 mg p.o. every day as needed she was very concerned about worsening osteoporosis.  Taking MiraLAX half pack twice a week.  Continue on Bentyl 10 mg as needed.    01/06/2021 patient called describing some burning discomfort in her stomach.    Today, the patient presents to clinic accompanied by her grandson and tells me that she is really not taking any of the medications given to her by Dr. Chales Abrahams previously.  Describes that she has frequent lower abdominal pain and chronic constipation as well as reflux.  Most recently over the past week she had worsened pain in her lower abdomen, rated as a 7-05/2009, which seemed better after having a bowel movement and waiting a few hours, but also started with a pain in her right upper quadrant which would seem to come and go worse with eating certain things.  Tells me she recalls a similar pain previously.  Also has not taking anything for her constipation.    Denies fever, chills, blood in her stool or weight loss.  Past Medical History:  Diagnosis Date  . A-fib (HCC)   . Acid reflux   . AF (paroxysmal atrial fibrillation) (HCC)   . Anemia   . Atypical chest pain 03/30/2016  . Bradycardia 08/12/2015  . DJD (degenerative joint disease)   . History of atrial fibrillation 12/05/2018   on pacemaker  . History of esophageal reflux 12/05/2018  . History of osteoporosis 12/05/2018  . History of vertebral fracture 12/05/2018   C1 and C2 fracture s/p fall in 2017 h/o of multiple compression fractures in lumbar region  . Hypercholesteremia 12/05/2018  . Hypertrophic and atrophic condition of skin  10/07/2019  . Ingrowing left great toenail 10/07/2019  . Osteoporosis   . Pacemaker 08/12/2015  . PAF (paroxysmal atrial fibrillation) (HCC) 08/21/2017  . Pain due to onychomycosis of toenails of both feet 10/07/2019  . Plantar callus 10/07/2019  . PMR (polymyalgia rheumatica) (HCC)   . Post-nasal drip 02/21/2018  . Primary osteoarthritis of both hands 12/05/2018  . Renal insufficiency 12/05/2018  . Vitamin D deficiency 12/05/2018    Past Surgical History:  Procedure Laterality Date  . ABDOMINAL HYSTERECTOMY    . COLONOSCOPY  07/09/2003   Colonic sigmoid diverticulosis. Otherwise normal attempted colonoscopy to 35cm.   . ESOPHAGOGASTRODUODENOSCOPY  10/15/2014   Erosive gastritis. Otherwise normal EGD,   . INSERT / REPLACE / REMOVE PACEMAKER     Medtronic    Current Outpatient Medications  Medication Sig Dispense Refill  . aspirin 325 MG EC tablet Take 325 mg by mouth daily.    Marland Kitchen denosumab (PROLIA) 60 MG/ML SOSY injection Inject 60 mg into the skin every 6 (six) months. Due 08/2020    . esomeprazole (NEXIUM) 40 MG capsule Take 1 capsule (40 mg total) by mouth daily at 12 noon. 30 capsule 0  . nitroGLYCERIN (NITROSTAT) 0.4 MG SL tablet Place 1 tablet (0.4 mg total) under the tongue every 5 (five) minutes as needed for chest pain. 90 tablet 3  . sotalol (BETAPACE) 80 MG tablet TAKE 1/2 TABLET(40 MG)  BY MOUTH TWICE DAILY (Patient taking differently: Take 80 mg by mouth daily.) 90 tablet 2   No current facility-administered medications for this visit.    Allergies as of 01/18/2021 - Review Complete 08/03/2020  Allergen Reaction Noted  . Codeine Nausea Only 04/25/2016    Family History  Problem Relation Age of Onset  . Diabetes Daughter   . Heart disease Daughter   . Cervical cancer Daughter   . Heart attack Mother   . Lung disease Son   . Colon cancer Neg Hx   . Esophageal cancer Neg Hx     Social History   Socioeconomic History  . Marital status: Married    Spouse name: Not on  file  . Number of children: 3  . Years of education: Not on file  . Highest education level: Not on file  Occupational History  . Not on file  Tobacco Use  . Smoking status: Never Smoker  . Smokeless tobacco: Never Used  Vaping Use  . Vaping Use: Never used  Substance and Sexual Activity  . Alcohol use: No  . Drug use: No  . Sexual activity: Not on file  Other Topics Concern  . Not on file  Social History Narrative  . Not on file   Social Determinants of Health   Financial Resource Strain: Not on file  Food Insecurity: Not on file  Transportation Needs: Not on file  Physical Activity: Not on file  Stress: Not on file  Social Connections: Not on file  Intimate Partner Violence: Not on file    Review of Systems:    Constitutional: No weight loss, fever or chills Cardiovascular: No chest pain Respiratory: No SOB  Gastrointestinal: See HPI and otherwise negative   Physical Exam:  Vital signs: BP 130/60   Pulse 68   Ht 5' (1.524 m)   Wt 82 lb 9.6 oz (37.5 kg)   BMI 16.13 kg/m   Constitutional:   Pleasant Elderly Caucasian female appears to be in NAD, Well developed, Well nourished, alert and cooperative Respiratory: Respirations even and unlabored. Lungs clear to auscultation bilaterally.   No wheezes, crackles, or rhonchi.  Cardiovascular: Normal S1, S2. No MRG. Regular rate and rhythm. No peripheral edema, cyanosis or pallor.  Gastrointestinal:  Soft, nondistended, mild epigastric and bilateral lower abdominal TTP. No rebound or guarding. Normal bowel sounds. No appreciable masses or hepatomegaly. Rectal:  Not performed.  Psychiatric:  Demonstrates good judgement and reason without abnormal affect or behaviors.  No recent labs or imaging.  Assessment: 1.  IBS-C: Chronic for the patient, she is currently not taking any medicine for this  2.  Lower abdominal pain: With above 3.  GERD/right upper quadrant pain: Chronic, but worse over the past week, previously  treated for reflux/gastritis, suspect this is the same  Plan: 1.  Discussed with patient that likely her recent pain is just an exacerbation of her chronic issues especially since she is not taking any of her medications. 2.  Previously patient very concerned about osteoporosis.  Started the patient today on Famotidine 40 mg daily, prescribed #30 with 5 refills. 3.  Would recommend the patient use MiraLAX daily for her constipation to avoid issues with this. 4.  Also refilled patient's Dicyclomine 10 mg every 6 hours as needed.  #30 with 5 refills.  Discussed that she can use this for lower abdominal cramping pain. 5.  Would recommend the patient's start a daily probiotic such as Align. 6.  Patient has follow-up with  Dr. Chales Abrahams already arranged in over a month, will allow her to keep this to ensure that all of her medications are helping.  Hyacinth Meeker, PA-C [facility] 01/18/2021, 1:54 PM  Cc: Wilmer Floor., MD

## 2021-01-26 DIAGNOSIS — I4891 Unspecified atrial fibrillation: Secondary | ICD-10-CM | POA: Diagnosis not present

## 2021-01-26 DIAGNOSIS — M810 Age-related osteoporosis without current pathological fracture: Secondary | ICD-10-CM | POA: Diagnosis not present

## 2021-01-26 DIAGNOSIS — M1611 Unilateral primary osteoarthritis, right hip: Secondary | ICD-10-CM | POA: Diagnosis not present

## 2021-01-26 DIAGNOSIS — D692 Other nonthrombocytopenic purpura: Secondary | ICD-10-CM | POA: Diagnosis not present

## 2021-01-26 DIAGNOSIS — N183 Chronic kidney disease, stage 3 unspecified: Secondary | ICD-10-CM | POA: Diagnosis not present

## 2021-01-26 DIAGNOSIS — E7800 Pure hypercholesterolemia, unspecified: Secondary | ICD-10-CM | POA: Diagnosis not present

## 2021-01-26 DIAGNOSIS — I495 Sick sinus syndrome: Secondary | ICD-10-CM | POA: Diagnosis not present

## 2021-01-26 DIAGNOSIS — E46 Unspecified protein-calorie malnutrition: Secondary | ICD-10-CM | POA: Diagnosis not present

## 2021-01-26 DIAGNOSIS — M7631 Iliotibial band syndrome, right leg: Secondary | ICD-10-CM | POA: Diagnosis not present

## 2021-01-26 DIAGNOSIS — G629 Polyneuropathy, unspecified: Secondary | ICD-10-CM | POA: Diagnosis not present

## 2021-01-26 DIAGNOSIS — S72141A Displaced intertrochanteric fracture of right femur, initial encounter for closed fracture: Secondary | ICD-10-CM | POA: Diagnosis not present

## 2021-01-26 DIAGNOSIS — E559 Vitamin D deficiency, unspecified: Secondary | ICD-10-CM | POA: Diagnosis not present

## 2021-01-26 DIAGNOSIS — K219 Gastro-esophageal reflux disease without esophagitis: Secondary | ICD-10-CM | POA: Diagnosis not present

## 2021-01-30 NOTE — Progress Notes (Signed)
Agree with plan RG 

## 2021-01-31 ENCOUNTER — Ambulatory Visit (INDEPENDENT_AMBULATORY_CARE_PROVIDER_SITE_OTHER): Payer: Medicare HMO | Admitting: Cardiology

## 2021-01-31 ENCOUNTER — Other Ambulatory Visit: Payer: Self-pay

## 2021-01-31 ENCOUNTER — Encounter: Payer: Self-pay | Admitting: Cardiology

## 2021-01-31 VITALS — BP 124/68 | HR 74 | Ht 60.0 in | Wt 83.0 lb

## 2021-01-31 DIAGNOSIS — I480 Paroxysmal atrial fibrillation: Secondary | ICD-10-CM

## 2021-01-31 NOTE — Progress Notes (Signed)
Electrophysiology Office Note   Date:  01/31/2021   ID:  Heather Thomas, DOB 1926-12-09, MRN [id number]  PCP:  Wilmer Floor., MD  Cardiologist:  Bing Matter Primary Electrophysiologist:  Takashi Korol Jorja Loa, MD    Chief Complaint: atrial fibrillation   History of Present Illness: Heather Thomas is a 87 y.o. female who is being seen today for the evaluation of atrial fibrillation at the request of Wilmer Floor., MD. Presenting today for electrophysiology evaluation.  She has a history significant for paroxysmal atrial fibrillation, hyperlipidemia.  She is status post Medtronic dual-chamber pacemaker implanted for sick sinus syndrome.  She is currently on sotalol for her atrial fibrillation.  Today, denies symptoms of palpitations, chest pain, shortness of breath, orthopnea, PND, lower extremity edema, claudication, dizziness, presyncope, syncope, bleeding, or neurologic sequela. The patient is tolerating medications without difficulties.  She currently feels well.  She has no chest pain or shortness of breath.  She is able do all her daily activities without restriction.   Past Medical History:  Diagnosis Date  . A-fib (HCC)   . Acid reflux   . AF (paroxysmal atrial fibrillation) (HCC)   . Anemia   . Atypical chest pain 03/30/2016  . Bradycardia 08/12/2015  . DJD (degenerative joint disease)   . History of atrial fibrillation 12/05/2018   on pacemaker  . History of esophageal reflux 12/05/2018  . History of osteoporosis 12/05/2018  . History of vertebral fracture 12/05/2018   C1 and C2 fracture s/p fall in 2017 h/o of multiple compression fractures in lumbar region  . Hypercholesteremia 12/05/2018  . Hypertrophic and atrophic condition of skin 10/07/2019  . Ingrowing left great toenail 10/07/2019  . Osteoporosis   . Pacemaker 08/12/2015  . PAF (paroxysmal atrial fibrillation) (HCC) 08/21/2017  . Pain due to onychomycosis of toenails of both feet 10/07/2019  . Plantar callus  10/07/2019  . PMR (polymyalgia rheumatica) (HCC)   . Post-nasal drip 02/21/2018  . Primary osteoarthritis of both hands 12/05/2018  . Renal insufficiency 12/05/2018  . Vitamin D deficiency 12/05/2018   Past Surgical History:  Procedure Laterality Date  . ABDOMINAL HYSTERECTOMY    . COLONOSCOPY  07/09/2003   Colonic sigmoid diverticulosis. Otherwise normal attempted colonoscopy to 35cm.   . ESOPHAGOGASTRODUODENOSCOPY  10/15/2014   Erosive gastritis. Otherwise normal EGD,   . HIP FRACTURE SURGERY Right   . INSERT / REPLACE / REMOVE PACEMAKER     Medtronic     Current Outpatient Medications  Medication Sig Dispense Refill  . denosumab (PROLIA) 60 MG/ML SOSY injection Inject 60 mg into the skin every 6 (six) months. Due 08/2020    . dicyclomine (BENTYL) 10 MG capsule Take 1 capsule (10 mg total) by mouth every 6 (six) hours as needed for spasms. 60 capsule 3  . famotidine (PEPCID) 40 MG tablet Take 1 tablet (40 mg total) by mouth daily. 30 tablet 3  . nitroGLYCERIN (NITROSTAT) 0.4 MG SL tablet Place 1 tablet (0.4 mg total) under the tongue every 5 (five) minutes as needed for chest pain. 90 tablet 3  . sotalol (BETAPACE) 80 MG tablet TAKE 1/2 TABLET(40 MG) BY MOUTH TWICE DAILY 90 tablet 2   No current facility-administered medications for this visit.    Allergies:   Codeine   Social History:  The patient  reports that she has never smoked. She has never used smokeless tobacco. She reports that she does not drink alcohol and does not use drugs.   Family History:  The patient's family history includes Cervical cancer in her daughter; Diabetes in her daughter; Heart attack in her mother; Heart disease in her daughter; Lung disease in her son.  ROS:  Please see the history of present illness.   Otherwise, review of systems is positive for none.   All other systems are reviewed and negative.   PHYSICAL EXAM: VS:  BP 124/68   Pulse 74   Ht 5' (1.524 m)   Wt 83 lb (37.6 kg)   SpO2 94%   BMI  16.21 kg/m  , BMI Body mass index is 16.21 kg/m. GEN: Well nourished, well developed, in no acute distress  HEENT: normal  Neck: no JVD, carotid bruits, or masses Cardiac: RRR; no murmurs, rubs, or gallops,no edema  Respiratory:  clear to auscultation bilaterally, normal work of breathing GI: soft, nontender, nondistended, + BS MS: no deformity or atrophy  Skin: warm and dry, device site well healed Neuro:  Strength and sensation are intact Psych: euthymic mood, full affect  EKG:  EKG is ordered today. Personal review of the ekg ordered shows sinus rhythm, rate 74, QTc 435 ms  Personal review of the device interrogation today. Results in Paceart   Recent Labs: 02/02/2020: BUN 33; Creatinine, Ser 1.30; Magnesium 2.2; Potassium 5.9; Sodium 137    Lipid Panel  No results found for: CHOL, TRIG, HDL, CHOLHDL, VLDL, LDLCALC, LDLDIRECT   Wt Readings from Last 3 Encounters:  01/31/21 83 lb (37.6 kg)  01/18/21 82 lb 9.6 oz (37.5 kg)  12/27/20 84 lb (38.1 kg)      Other studies Reviewed: Additional studies/ records that were reviewed today include: TTE 2017  Review of the above records today demonstrates:  The left ventricular size is normal. The left ventricular wall motion is normal. The right ventricle is normal in size and function. Device lead in the right ventricle The left atrial size is normal. Right atrial size is normal. There is aortic valve sclerosis. There is trace aortic regurgitation. There is trace mitral regurgitation. There is mild tricuspid regurgitation. There is no pericardial effusion.   ASSESSMENT AND PLAN:  1.  Paroxysmal atrial fibrillation: Currently on sotalol with a CHA2DS2-VASc of 3.  Not anticoagulated due to fall risk.  He has fortunately remained in normal rhythm.  No changes.  2.  Sick sinus syndrome: Status post Medtronic dual-chamber pacemaker.  Device functioning appropriately.  No changes at this time.  Her device has 7 months till ERI.   We Remmy Crass bring her back prior to generator change.     Current medicines are reviewed at length with the patient today.   The patient does not have concerns regarding her medicines.  The following changes were made today: None  Labs/ tests ordered today include:  Orders Placed This Encounter  Procedures  . EKG 12-Lead     Disposition:   FU with Quinlynn Cuthbert 6 months  Signed, Caitlin Ainley Jorja Loa, MD  01/31/2021 11:44 AM     [facility] [address] [place] [address] [phone] (office) [phone] (fax)

## 2021-01-31 NOTE — Patient Instructions (Signed)
Medication Instructions:  Your physician recommends that you continue on your current medications as directed. Please refer to the Current Medication list given to you today.  *If you need a refill on your cardiac medications before your next appointment, please call your pharmacy*   Lab Work: None ordered   Testing/Procedures: None ordered   Follow-Up: At [facility], you and your health needs are our priority.  As part of our continuing mission to provide you with exceptional heart care, we have created designated Provider Care Teams.  These Care Teams include your primary Cardiologist (physician) and Advanced Practice Providers (APPs -  Physician Assistants and Nurse Practitioners) who all work together to provide you with the care you need, when you need it.  Remote monitoring is used to monitor your Pacemaker or ICD from home. This monitoring reduces the number of office visits required to check your device to one time per year. It allows [place] to keep an eye on the functioning of your device to ensure it is working properly. You are scheduled for a device check from home on 03/14/2021. You may send your transmission at any time that day. If you have a wireless device, the transmission will be sent automatically. After your physician reviews your transmission, you will receive a postcard with your next transmission date.  Your next appointment:   6 month(s)  The format for your next appointment:   In Person  Provider:   Loman Brooklyn, MD   Thank you for choosing [facility]!!   Dory Horn, RN [phone]

## 2021-02-01 ENCOUNTER — Ambulatory Visit: Payer: Medicare HMO | Admitting: Gastroenterology

## 2021-02-07 ENCOUNTER — Ambulatory Visit: Payer: Medicare HMO | Admitting: Gastroenterology

## 2021-02-17 ENCOUNTER — Telehealth: Payer: Self-pay | Admitting: Physician Assistant

## 2021-02-17 ENCOUNTER — Other Ambulatory Visit: Payer: Self-pay

## 2021-02-17 MED ORDER — FAMOTIDINE 40 MG PO TABS: 40.0000 mg | ORAL_TABLET | Freq: Every day | ORAL | 5 refills | Status: DC

## 2021-02-17 NOTE — Telephone Encounter (Signed)
Refill of medication was sent to patients pharmacy.

## 2021-02-20 DIAGNOSIS — Z681 Body mass index (BMI) 19 or less, adult: Secondary | ICD-10-CM | POA: Diagnosis not present

## 2021-02-20 DIAGNOSIS — I4891 Unspecified atrial fibrillation: Secondary | ICD-10-CM | POA: Diagnosis not present

## 2021-02-20 DIAGNOSIS — R109 Unspecified abdominal pain: Secondary | ICD-10-CM | POA: Diagnosis not present

## 2021-02-20 DIAGNOSIS — R1084 Generalized abdominal pain: Secondary | ICD-10-CM | POA: Diagnosis not present

## 2021-02-20 DIAGNOSIS — K5792 Diverticulitis of intestine, part unspecified, without perforation or abscess without bleeding: Secondary | ICD-10-CM | POA: Diagnosis not present

## 2021-02-20 DIAGNOSIS — D509 Iron deficiency anemia, unspecified: Secondary | ICD-10-CM | POA: Diagnosis not present

## 2021-02-20 DIAGNOSIS — E43 Unspecified severe protein-calorie malnutrition: Secondary | ICD-10-CM | POA: Diagnosis not present

## 2021-02-20 DIAGNOSIS — K5732 Diverticulitis of large intestine without perforation or abscess without bleeding: Secondary | ICD-10-CM | POA: Diagnosis not present

## 2021-02-20 DIAGNOSIS — D649 Anemia, unspecified: Secondary | ICD-10-CM | POA: Diagnosis not present

## 2021-02-20 DIAGNOSIS — M810 Age-related osteoporosis without current pathological fracture: Secondary | ICD-10-CM | POA: Diagnosis not present

## 2021-02-20 DIAGNOSIS — N179 Acute kidney failure, unspecified: Secondary | ICD-10-CM | POA: Diagnosis not present

## 2021-02-20 DIAGNOSIS — F32A Depression, unspecified: Secondary | ICD-10-CM | POA: Diagnosis not present

## 2021-02-21 DIAGNOSIS — K5732 Diverticulitis of large intestine without perforation or abscess without bleeding: Secondary | ICD-10-CM | POA: Diagnosis not present

## 2021-02-21 DIAGNOSIS — E43 Unspecified severe protein-calorie malnutrition: Secondary | ICD-10-CM | POA: Diagnosis not present

## 2021-02-21 DIAGNOSIS — N179 Acute kidney failure, unspecified: Secondary | ICD-10-CM | POA: Diagnosis not present

## 2021-02-22 DIAGNOSIS — N179 Acute kidney failure, unspecified: Secondary | ICD-10-CM | POA: Diagnosis not present

## 2021-02-22 DIAGNOSIS — K5732 Diverticulitis of large intestine without perforation or abscess without bleeding: Secondary | ICD-10-CM | POA: Diagnosis not present

## 2021-02-22 DIAGNOSIS — E43 Unspecified severe protein-calorie malnutrition: Secondary | ICD-10-CM | POA: Diagnosis not present

## 2021-02-25 ENCOUNTER — Ambulatory Visit: Payer: Medicare HMO | Admitting: Gastroenterology

## 2021-03-01 DIAGNOSIS — E43 Unspecified severe protein-calorie malnutrition: Secondary | ICD-10-CM | POA: Diagnosis not present

## 2021-03-01 DIAGNOSIS — N179 Acute kidney failure, unspecified: Secondary | ICD-10-CM | POA: Diagnosis not present

## 2021-03-01 DIAGNOSIS — I509 Heart failure, unspecified: Secondary | ICD-10-CM | POA: Diagnosis not present

## 2021-03-01 DIAGNOSIS — I4891 Unspecified atrial fibrillation: Secondary | ICD-10-CM | POA: Diagnosis not present

## 2021-03-01 DIAGNOSIS — M1990 Unspecified osteoarthritis, unspecified site: Secondary | ICD-10-CM | POA: Diagnosis not present

## 2021-03-01 DIAGNOSIS — I110 Hypertensive heart disease with heart failure: Secondary | ICD-10-CM | POA: Diagnosis not present

## 2021-03-01 DIAGNOSIS — D509 Iron deficiency anemia, unspecified: Secondary | ICD-10-CM | POA: Diagnosis not present

## 2021-03-01 DIAGNOSIS — M810 Age-related osteoporosis without current pathological fracture: Secondary | ICD-10-CM | POA: Diagnosis not present

## 2021-03-01 DIAGNOSIS — K5792 Diverticulitis of intestine, part unspecified, without perforation or abscess without bleeding: Secondary | ICD-10-CM | POA: Diagnosis not present

## 2021-03-02 DIAGNOSIS — N179 Acute kidney failure, unspecified: Secondary | ICD-10-CM | POA: Diagnosis not present

## 2021-03-02 DIAGNOSIS — I110 Hypertensive heart disease with heart failure: Secondary | ICD-10-CM | POA: Diagnosis not present

## 2021-03-02 DIAGNOSIS — M810 Age-related osteoporosis without current pathological fracture: Secondary | ICD-10-CM | POA: Diagnosis not present

## 2021-03-02 DIAGNOSIS — D509 Iron deficiency anemia, unspecified: Secondary | ICD-10-CM | POA: Diagnosis not present

## 2021-03-02 DIAGNOSIS — K5792 Diverticulitis of intestine, part unspecified, without perforation or abscess without bleeding: Secondary | ICD-10-CM | POA: Diagnosis not present

## 2021-03-02 DIAGNOSIS — M1990 Unspecified osteoarthritis, unspecified site: Secondary | ICD-10-CM | POA: Diagnosis not present

## 2021-03-02 DIAGNOSIS — I4891 Unspecified atrial fibrillation: Secondary | ICD-10-CM | POA: Diagnosis not present

## 2021-03-02 DIAGNOSIS — E43 Unspecified severe protein-calorie malnutrition: Secondary | ICD-10-CM | POA: Diagnosis not present

## 2021-03-02 DIAGNOSIS — I509 Heart failure, unspecified: Secondary | ICD-10-CM | POA: Diagnosis not present

## 2021-03-04 DIAGNOSIS — K5792 Diverticulitis of intestine, part unspecified, without perforation or abscess without bleeding: Secondary | ICD-10-CM | POA: Diagnosis not present

## 2021-03-04 DIAGNOSIS — M810 Age-related osteoporosis without current pathological fracture: Secondary | ICD-10-CM | POA: Diagnosis not present

## 2021-03-04 DIAGNOSIS — D509 Iron deficiency anemia, unspecified: Secondary | ICD-10-CM | POA: Diagnosis not present

## 2021-03-04 DIAGNOSIS — E43 Unspecified severe protein-calorie malnutrition: Secondary | ICD-10-CM | POA: Diagnosis not present

## 2021-03-04 DIAGNOSIS — I4891 Unspecified atrial fibrillation: Secondary | ICD-10-CM | POA: Diagnosis not present

## 2021-03-04 DIAGNOSIS — I110 Hypertensive heart disease with heart failure: Secondary | ICD-10-CM | POA: Diagnosis not present

## 2021-03-04 DIAGNOSIS — N179 Acute kidney failure, unspecified: Secondary | ICD-10-CM | POA: Diagnosis not present

## 2021-03-04 DIAGNOSIS — M1990 Unspecified osteoarthritis, unspecified site: Secondary | ICD-10-CM | POA: Diagnosis not present

## 2021-03-04 DIAGNOSIS — I509 Heart failure, unspecified: Secondary | ICD-10-CM | POA: Diagnosis not present

## 2021-03-08 DIAGNOSIS — D509 Iron deficiency anemia, unspecified: Secondary | ICD-10-CM | POA: Diagnosis not present

## 2021-03-08 DIAGNOSIS — I110 Hypertensive heart disease with heart failure: Secondary | ICD-10-CM | POA: Diagnosis not present

## 2021-03-08 DIAGNOSIS — K5792 Diverticulitis of intestine, part unspecified, without perforation or abscess without bleeding: Secondary | ICD-10-CM | POA: Diagnosis not present

## 2021-03-08 DIAGNOSIS — I509 Heart failure, unspecified: Secondary | ICD-10-CM | POA: Diagnosis not present

## 2021-03-08 DIAGNOSIS — I4891 Unspecified atrial fibrillation: Secondary | ICD-10-CM | POA: Diagnosis not present

## 2021-03-08 DIAGNOSIS — M1990 Unspecified osteoarthritis, unspecified site: Secondary | ICD-10-CM | POA: Diagnosis not present

## 2021-03-08 DIAGNOSIS — E43 Unspecified severe protein-calorie malnutrition: Secondary | ICD-10-CM | POA: Diagnosis not present

## 2021-03-08 DIAGNOSIS — M810 Age-related osteoporosis without current pathological fracture: Secondary | ICD-10-CM | POA: Diagnosis not present

## 2021-03-08 DIAGNOSIS — N179 Acute kidney failure, unspecified: Secondary | ICD-10-CM | POA: Diagnosis not present

## 2021-03-10 DIAGNOSIS — M1990 Unspecified osteoarthritis, unspecified site: Secondary | ICD-10-CM | POA: Diagnosis not present

## 2021-03-10 DIAGNOSIS — D509 Iron deficiency anemia, unspecified: Secondary | ICD-10-CM | POA: Diagnosis not present

## 2021-03-10 DIAGNOSIS — I4891 Unspecified atrial fibrillation: Secondary | ICD-10-CM | POA: Diagnosis not present

## 2021-03-10 DIAGNOSIS — N179 Acute kidney failure, unspecified: Secondary | ICD-10-CM | POA: Diagnosis not present

## 2021-03-10 DIAGNOSIS — I509 Heart failure, unspecified: Secondary | ICD-10-CM | POA: Diagnosis not present

## 2021-03-10 DIAGNOSIS — M810 Age-related osteoporosis without current pathological fracture: Secondary | ICD-10-CM | POA: Diagnosis not present

## 2021-03-10 DIAGNOSIS — E43 Unspecified severe protein-calorie malnutrition: Secondary | ICD-10-CM | POA: Diagnosis not present

## 2021-03-10 DIAGNOSIS — I110 Hypertensive heart disease with heart failure: Secondary | ICD-10-CM | POA: Diagnosis not present

## 2021-03-10 DIAGNOSIS — K5792 Diverticulitis of intestine, part unspecified, without perforation or abscess without bleeding: Secondary | ICD-10-CM | POA: Diagnosis not present

## 2021-03-15 ENCOUNTER — Telehealth: Payer: Self-pay | Admitting: Gastroenterology

## 2021-03-15 ENCOUNTER — Other Ambulatory Visit: Payer: Self-pay

## 2021-03-15 MED ORDER — DICYCLOMINE HCL 10 MG PO CAPS: 10.0000 mg | ORAL_CAPSULE | Freq: Four times a day (QID) | ORAL | 3 refills | Status: DC | PRN

## 2021-03-15 NOTE — Telephone Encounter (Signed)
Inbound call from pt requesting a call back. She is having pain in her stomach area and vomiting. Please advise

## 2021-03-15 NOTE — Telephone Encounter (Signed)
Pt reports she is having pain under her ribs down to the lower part of her stomach. Asked pt is she was taking her dicyclomine. States she does not know but she is going to check and start taking it if she hasn't been. She wants [place] to let her know if she can see Dr. Chales Abrahams sooner than her scheduled appt. Let pt know we will place her on the cancellation list.

## 2021-03-15 NOTE — Telephone Encounter (Signed)
Refill sent to pharmacy for pt and she is aware.

## 2021-03-15 NOTE — Telephone Encounter (Signed)
Patient called in to say she do not have any dicyclomine to begin taking but does know that's the medication and asks for a refill.

## 2021-03-17 DIAGNOSIS — I509 Heart failure, unspecified: Secondary | ICD-10-CM | POA: Diagnosis not present

## 2021-03-17 DIAGNOSIS — I4891 Unspecified atrial fibrillation: Secondary | ICD-10-CM | POA: Diagnosis not present

## 2021-03-17 DIAGNOSIS — I110 Hypertensive heart disease with heart failure: Secondary | ICD-10-CM | POA: Diagnosis not present

## 2021-03-17 DIAGNOSIS — M1990 Unspecified osteoarthritis, unspecified site: Secondary | ICD-10-CM | POA: Diagnosis not present

## 2021-03-17 DIAGNOSIS — K5792 Diverticulitis of intestine, part unspecified, without perforation or abscess without bleeding: Secondary | ICD-10-CM | POA: Diagnosis not present

## 2021-03-17 DIAGNOSIS — D509 Iron deficiency anemia, unspecified: Secondary | ICD-10-CM | POA: Diagnosis not present

## 2021-03-17 DIAGNOSIS — N179 Acute kidney failure, unspecified: Secondary | ICD-10-CM | POA: Diagnosis not present

## 2021-03-17 DIAGNOSIS — M810 Age-related osteoporosis without current pathological fracture: Secondary | ICD-10-CM | POA: Diagnosis not present

## 2021-03-17 DIAGNOSIS — E43 Unspecified severe protein-calorie malnutrition: Secondary | ICD-10-CM | POA: Diagnosis not present

## 2021-03-18 ENCOUNTER — Telehealth: Payer: Self-pay

## 2021-03-18 NOTE — Telephone Encounter (Signed)
The patient states she tried to send a transmission with her monitor but it is not working. She would like to come into the office to see Dr. Elberta Fortis so her device can be interrogated. She only drives in [place] and do not travel to [place]. I told her I will have my scheduler to call her on Monday to schedule her an appointment in [place] with Dr. Elberta Fortis. I asked her to bring her monitor so the Medtr[employer] rep can help trouble shoot her monitor. She states she would like the scheduler to call her after 1 pm because she has an appointment Monday morning.

## 2021-03-24 DIAGNOSIS — M1990 Unspecified osteoarthritis, unspecified site: Secondary | ICD-10-CM | POA: Diagnosis not present

## 2021-03-24 DIAGNOSIS — I110 Hypertensive heart disease with heart failure: Secondary | ICD-10-CM | POA: Diagnosis not present

## 2021-03-24 DIAGNOSIS — M810 Age-related osteoporosis without current pathological fracture: Secondary | ICD-10-CM | POA: Diagnosis not present

## 2021-03-24 DIAGNOSIS — K5792 Diverticulitis of intestine, part unspecified, without perforation or abscess without bleeding: Secondary | ICD-10-CM | POA: Diagnosis not present

## 2021-03-24 DIAGNOSIS — I4891 Unspecified atrial fibrillation: Secondary | ICD-10-CM | POA: Diagnosis not present

## 2021-03-24 DIAGNOSIS — N179 Acute kidney failure, unspecified: Secondary | ICD-10-CM | POA: Diagnosis not present

## 2021-03-24 DIAGNOSIS — E43 Unspecified severe protein-calorie malnutrition: Secondary | ICD-10-CM | POA: Diagnosis not present

## 2021-03-24 DIAGNOSIS — I509 Heart failure, unspecified: Secondary | ICD-10-CM | POA: Diagnosis not present

## 2021-03-24 DIAGNOSIS — D509 Iron deficiency anemia, unspecified: Secondary | ICD-10-CM | POA: Diagnosis not present

## 2021-03-25 DIAGNOSIS — Z5321 Procedure and treatment not carried out due to patient leaving prior to being seen by health care provider: Secondary | ICD-10-CM | POA: Diagnosis not present

## 2021-03-25 DIAGNOSIS — R109 Unspecified abdominal pain: Secondary | ICD-10-CM | POA: Diagnosis not present

## 2021-03-27 ENCOUNTER — Emergency Department (HOSPITAL_BASED_OUTPATIENT_CLINIC_OR_DEPARTMENT_OTHER): Payer: Medicare HMO

## 2021-03-27 ENCOUNTER — Encounter (HOSPITAL_BASED_OUTPATIENT_CLINIC_OR_DEPARTMENT_OTHER): Payer: Self-pay | Admitting: Emergency Medicine

## 2021-03-27 ENCOUNTER — Emergency Department (HOSPITAL_BASED_OUTPATIENT_CLINIC_OR_DEPARTMENT_OTHER)
Admission: EM | Admit: 2021-03-27 | Discharge: 2021-03-27 | Disposition: A | Discharge status: Home / Self Care | Payer: Medicare HMO | Attending: Emergency Medicine | Admitting: Emergency Medicine

## 2021-03-27 ENCOUNTER — Other Ambulatory Visit: Payer: Self-pay

## 2021-03-27 DIAGNOSIS — R0781 Pleurodynia: Secondary | ICD-10-CM | POA: Diagnosis not present

## 2021-03-27 MED ORDER — IBUPROFEN 400 MG PO TABS
400.0000 mg | ORAL_TABLET | Freq: Once | ORAL | Status: AC
Start: 2021-03-27 — End: 2021-03-27
  Administered 2021-03-27: 400 mg via ORAL
  Filled 2021-03-27: qty 1

## 2021-03-27 MED ORDER — IBUPROFEN 400 MG PO TABS: 800.0000 mg | ORAL_TABLET | Freq: Three times a day (TID) | ORAL | 0 refills | Status: AC | PRN

## 2021-03-27 NOTE — ED Provider Notes (Signed)
[facility] EMERGENCY DEPARTMENT Provider Note  CSN: [id number] Arrival date & time: 03/27/21 1059    History Chief Complaint  Patient presents with   Chest Pain    Left rib pain    Heather Thomas is a 87 y.o. female presents for evaluation of L lower rib pain, onset 4 days ago. She states she slept on the couch and when she woke up her L lower ribs were hurting and she could feel a knot there. She thought there was also a bruise. She did not have any falls or injuries. Not taking any blood thinners. No chest pain, SOB, or fever. No N/V.    Past Medical History:  Diagnosis Date   A-fib (HCC)    Acid reflux    AF (paroxysmal atrial fibrillation) (HCC)    Anemia    Atypical chest pain 03/30/2016   Bradycardia 08/12/2015   DJD (degenerative joint disease)    History of atrial fibrillation 12/05/2018   on pacemaker   History of esophageal reflux 12/05/2018   History of osteoporosis 12/05/2018   History of vertebral fracture 12/05/2018   C1 and C2 fracture s/p fall in 2017 h/o of multiple compression fractures in lumbar region   Hypercholesteremia 12/05/2018   Hypertrophic and atrophic condition of skin 10/07/2019   Ingrowing left great toenail 10/07/2019   Osteoporosis    Pacemaker 08/12/2015   PAF (paroxysmal atrial fibrillation) (HCC) 08/21/2017   Pain due to onychomycosis of toenails of both feet 10/07/2019   Plantar callus 10/07/2019   PMR (polymyalgia rheumatica) (HCC)    Post-nasal drip 02/21/2018   Primary osteoarthritis of both hands 12/05/2018   Renal insufficiency 12/05/2018   Vitamin D deficiency 12/05/2018    Past Surgical History:  Procedure Laterality Date   ABDOMINAL HYSTERECTOMY     COLONOSCOPY  07/09/2003   Colonic sigmoid diverticulosis. Otherwise normal attempted colonoscopy to 35cm.    ESOPHAGOGASTRODUODENOSCOPY  10/15/2014   Erosive gastritis. Otherwise normal EGD,    HIP FRACTURE SURGERY Right    INSERT / REPLACE / REMOVE PACEMAKER     Medtronic    Family  History  Problem Relation Age of Onset   Diabetes Daughter    Heart disease Daughter    Cervical cancer Daughter    Heart attack Mother    Lung disease Son    Colon cancer Neg Hx    Esophageal cancer Neg Hx     Social History   Tobacco Use   Smoking status: Never   Smokeless tobacco: Never  Vaping Use   Vaping Use: Never used  Substance Use Topics   Alcohol use: No   Drug use: No     Home Medications Prior to Admission medications   Medication Sig Start Date End Date Taking? Authorizing Provider  ibuprofen (ADVIL) 400 MG tablet Take 2 tablets (800 mg total) by mouth every 8 (eight) hours as needed. 03/27/21  Yes Pollyann Savoy, MD  denosumab (PROLIA) 60 MG/ML SOSY injection Inject 60 mg into the skin every 6 (six) months. Due 08/2020    [provider]  dicyclomine (BENTYL) 10 MG capsule Take 1 capsule (10 mg total) by mouth every 6 (six) hours as needed for spasms. 03/15/21   Unk Lightning, PA  famotidine (PEPCID) 40 MG tablet Take 1 tablet (40 mg total) by mouth daily. 02/17/21   Unk Lightning, PA  nitroGLYCERIN (NITROSTAT) 0.4 MG SL tablet Place 1 tablet (0.4 mg total) under the tongue every 5 (five)  minutes as needed for chest pain. 05/25/20 08/23/20  Georgeanna Lea, MD  sotalol (BETAPACE) 80 MG tablet TAKE 1/2 TABLET(40 MG) BY MOUTH TWICE DAILY 10/11/20   Georgeanna Lea, MD     Allergies    Codeine   Review of Systems   Review of Systems A comprehensive review of systems was completed and negative except as noted in HPI.    Physical Exam BP (!) 149/73   Pulse 66   Temp 98.2 F (36.8 C) (Oral)   Resp 20   Ht 4\' 11"  (1.499 m)   Wt 39.5 kg   SpO2 97%   BMI 17.57 kg/m   Physical Exam Vitals and nursing note reviewed.  Constitutional:      Appearance: Normal appearance.  HENT:     Head: Normocephalic and atraumatic.     Nose: Nose normal.     Mouth/Throat:     Mouth: Mucous membranes are moist.  Eyes:      Extraocular Movements: Extraocular movements intact.     Conjunctiva/sclera: Conjunctivae normal.  Cardiovascular:     Rate and Rhythm: Normal rate.  Pulmonary:     Effort: Pulmonary effort is normal.     Breath sounds: Normal breath sounds.  Chest:     Chest wall: Tenderness (L lower ribs) present.  Abdominal:     General: Abdomen is flat.     Palpations: Abdomen is soft.     Tenderness: There is no abdominal tenderness.  Musculoskeletal:        General: No swelling. Normal range of motion.     Cervical back: Neck supple.  Skin:    General: Skin is warm and dry.  Neurological:     General: No focal deficit present.     Mental Status: She is alert.  Psychiatric:        Mood and Affect: Mood normal.     ED Results / Procedures / Treatments   Labs (all labs ordered are listed, but only abnormal results are displayed) Labs Reviewed - No data to display  EKG None   Radiology DG Ribs Unilateral W/Chest Left  Result Date: 03/27/2021 CLINICAL DATA:  Left lower rib pain.  No known trauma. EXAM: LEFT RIBS AND CHEST - 3+ VIEW COMPARISON:  Chest x-ray September 12, 2020 FINDINGS: No fracture or other bone lesions are seen involving the ribs. There is no evidence of pneumothorax or pleural effusion. Both lungs are clear. Heart size and mediastinal contours are within normal limits. There is soft tissue calcifications underlying the BB. IMPRESSION: 1. No rib fractures identified.  No changes to the chest. 2. Soft tissue calcifications underlying the BB may represent costochondral calcifications. Electronically Signed   By: September 14, 2020 III M.D   On: 03/27/2021 12:28    Procedures Procedures  Medications Ordered in the ED Medications  ibuprofen (ADVIL) tablet 400 mg (400 mg Oral Given 03/27/21 1333)     MDM Rules/Calculators/A&P MDM The 'knot' that the patient identifies is her lower rib margin. She is tender to palpation there but no bruising or other signs of injury. No rash  to suggest shingles. Will check xray. She took some APAP this morning before [facility] to [facility] ED but left before being seen there due to wait times.   ED Course  I have reviewed the triage vital signs and the nursing notes.  Pertinent labs & imaging results that were available during my care of the patient were reviewed by me and considered in my medical  decision making (see chart for details).  Clinical Course as of 03/27/21 1409  Sun Mar 27, 2021  1406 Xray images reviewed, neg for acute injury. She is able to ambulate at her usual baseline with walker. Plan discharge home, Rx for motrin and close PCP follow up.  [CS]    Clinical Course User Index [CS] Pollyann Savoy, MD    Final Clinical Impression(s) / ED Diagnoses Final diagnoses:  Rib pain on left side    Rx / DC Orders ED Discharge Orders          Ordered    ibuprofen (ADVIL) 400 MG tablet  Every 8 hours PRN        03/27/21 1408             Pollyann Savoy, MD 03/27/21 1409

## 2021-03-27 NOTE — ED Notes (Signed)
Pt remains at xray at this time.

## 2021-03-27 NOTE — ED Triage Notes (Signed)
Pt reports that she noticed a knot on her left rib area onset Thursday. Pt does not recall falling.

## 2021-03-29 ENCOUNTER — Ambulatory Visit (INDEPENDENT_AMBULATORY_CARE_PROVIDER_SITE_OTHER): Payer: Medicare HMO | Admitting: Gastroenterology

## 2021-03-29 ENCOUNTER — Encounter: Payer: Self-pay | Admitting: Gastroenterology

## 2021-03-29 ENCOUNTER — Other Ambulatory Visit: Payer: Self-pay

## 2021-03-29 VITALS — BP 108/68 | HR 65 | Ht 59.0 in | Wt 80.4 lb

## 2021-03-29 DIAGNOSIS — K581 Irritable bowel syndrome with constipation: Secondary | ICD-10-CM

## 2021-03-29 DIAGNOSIS — R1030 Lower abdominal pain, unspecified: Secondary | ICD-10-CM | POA: Diagnosis not present

## 2021-03-29 DIAGNOSIS — K219 Gastro-esophageal reflux disease without esophagitis: Secondary | ICD-10-CM | POA: Diagnosis not present

## 2021-03-29 MED ORDER — DICYCLOMINE HCL 10 MG PO CAPS: 10.0000 mg | ORAL_CAPSULE | Freq: Two times a day (BID) | ORAL | 5 refills | Status: AC | PRN

## 2021-03-29 MED ORDER — FAMOTIDINE 40 MG PO TABS: 40.0000 mg | ORAL_TABLET | Freq: Every day | ORAL | 5 refills | Status: AC

## 2021-03-29 NOTE — Patient Instructions (Signed)
If you are age 85 or older, your body mass index should be between 23-30. Your Body mass index is 16.23 kg/m. If this is out of the aforementioned range listed, please consider follow up with your Primary Care Provider.  If you are age 13 or younger, your body mass index should be between 19-25. Your Body mass index is 16.23 kg/m. If this is out of the aformentioned range listed, please consider follow up with your Primary Care Provider.   __________________________________________________________  The  GI providers would like to encourage you to use [facility] to communicate with providers for non-urgent requests or questions.  Due to long hold times on the telephone, sending your provider a message by [facility] may be a faster and more efficient way to get a response.  Please allow 48 business hours for a response.  Please remember that this is for non-urgent requests.   Continue pepcid and bentyl Miralax 0.5 packet twice a week  Thank you,  Dr. Lynann Bologna

## 2021-03-29 NOTE — Progress Notes (Addendum)
Chief Complaint: FU  Referring Provider: Dr. Wyline Mood      ASSESSMENT AND PLAN;   #1.  IBS-C, has diarrhea when she eats sweet foods.  #2.  GERD.  #3. Lower abdominal pain (better with defecation)-resolved. Neg CT AP 01/2021 except for chronic colonic wall thickening, sequela of chronic diverticulosis.  Plan: - Continue pepcid 20mg  po qd. If any breakthrough symptoms, then increase to BID. - Miralax 1/2 pak twice a week (Monday and wednesday). Put in juice. - She does not want another colonoscopy or CT. I do agree. - Bentyl 10mg   BID prn #60 - FU as needed. HPI:    Heather Thomas is a 87 y.o. female , very frail For follow-up visit, accompanied by her grandson  Has been in [facility] last week for lower abdominal pain.  Underwent CT Abdo/pelvis which was negative for acute diverticulitis or abscess.  There was chronic wall thickening.  She was empirically diagnosed as having acute diverticulitis and treated with antibiotics.  We do not have all the records currently.  She does feel better.  She has been using ibuprofen on a as needed basis.  Has alternating constipation and diarrhea. Has diarrhea when she eats sweets like donuts or cookies.  She also has diarrhea if she has artificial sweeteners.  Lower abdominal discomfort gets better with defecation  No fever or chills.  No melena or hematochezia.  Has occasional indigestion/heartburn.  This did get better with AcipHex in the past.  She is very much concerned about taking AcipHex due to history of osteoporosis.  She is currently on Prolia  Lost all her family.  Has grandson who takes care of her.  Had attempted colonoscopy 07/2003 up to 35 cm, severe sigmoid diverticulosis.  Refuses any further colonoscopies.  CT 02/20/2021 1. Evaluation of the abdomen is generally somewhat limited by patient breath and motion artifact throughout. 2. Within this limitation, there is sigmoid diverticulosis with mild wall thickening of  the distal sigmoid, similar in appearance to prior examination. Findings are most consistent with chronic sequelae of diverticulitis without specific findings to suggest acute inflammation.  Aortic Atherosclerosis (ICD10-I70.0). Past Medical History:  Diagnosis Date   A-fib (HCC)    Acid reflux    AF (paroxysmal atrial fibrillation) (HCC)    Anemia    Atypical chest pain 03/30/2016   Bradycardia 08/12/2015   DJD (degenerative joint disease)    History of atrial fibrillation 12/05/2018   on pacemaker   History of esophageal reflux 12/05/2018   History of osteoporosis 12/05/2018   History of vertebral fracture 12/05/2018   C1 and C2 fracture s/p fall in 2017 h/o of multiple compression fractures in lumbar region   Hypercholesteremia 12/05/2018   Hypertrophic and atrophic condition of skin 10/07/2019   Ingrowing left great toenail 10/07/2019   Osteoporosis    Pacemaker 08/12/2015   PAF (paroxysmal atrial fibrillation) (HCC) 08/21/2017   Pain due to onychomycosis of toenails of both feet 10/07/2019   Plantar callus 10/07/2019   PMR (polymyalgia rheumatica) (HCC)    Post-nasal drip 02/21/2018   Primary osteoarthritis of both hands 12/05/2018   Renal insufficiency 12/05/2018   Vitamin D deficiency 12/05/2018    Past Surgical History:  Procedure Laterality Date   ABDOMINAL HYSTERECTOMY     COLONOSCOPY  07/09/2003   Colonic sigmoid diverticulosis. Otherwise normal attempted colonoscopy to 35cm.    ESOPHAGOGASTRODUODENOSCOPY  10/15/2014   Erosive gastritis. Otherwise normal EGD,    HIP FRACTURE SURGERY Right  INSERT / REPLACE / REMOVE PACEMAKER     Medtronic    Family History  Problem Relation Age of Onset   Diabetes Daughter    Heart disease Daughter    Cervical cancer Daughter    Heart attack Mother    Lung disease Son    Colon cancer Neg Hx    Esophageal cancer Neg Hx     Social History   Tobacco Use   Smoking status: Never   Smokeless tobacco: Never  Vaping Use   Vaping Use: Never  used  Substance Use Topics   Alcohol use: No   Drug use: No    Current Outpatient Medications  Medication Sig Dispense Refill   denosumab (PROLIA) 60 MG/ML SOSY injection Inject 60 mg into the skin every 6 (six) months. Due 08/2020     dicyclomine (BENTYL) 10 MG capsule Take 1 capsule (10 mg total) by mouth every 6 (six) hours as needed for spasms. 60 capsule 3   famotidine (PEPCID) 40 MG tablet Take 1 tablet (40 mg total) by mouth daily. 30 tablet 5   ibuprofen (ADVIL) 400 MG tablet Take 2 tablets (800 mg total) by mouth every 8 (eight) hours as needed. 14 tablet 0   sotalol (BETAPACE) 80 MG tablet TAKE 1/2 TABLET(40 MG) BY MOUTH TWICE DAILY 90 tablet 2   nitroGLYCERIN (NITROSTAT) 0.4 MG SL tablet Place 1 tablet (0.4 mg total) under the tongue every 5 (five) minutes as needed for chest pain. 90 tablet 3   No current facility-administered medications for this visit.    Allergies  Allergen Reactions   Codeine Nausea Only    Review of Systems:  Negative except for HPI     Physical Exam:    Today's Vitals   03/29/21 1319  BP: 108/68  Pulse: 65  SpO2: 98%  Weight: 80 lb 6 oz (36.5 kg)  Height: 4\' 11"  (1.499 m)   Body mass index is 16.23 kg/m.  Gen: awake, alert, NAD.  Very frail HEENT: anicteric, no pallor CV: RRR, no mrg Pulm: CTA b/l Abd: soft, NT/ND, +BS throughout Ext: no c/c/e Neuro: nonfocal.  She walks with a walker.       , MD 03/29/2021, 1:45 PM  Cc: 03/31/2021., MD

## 2021-03-30 DIAGNOSIS — M810 Age-related osteoporosis without current pathological fracture: Secondary | ICD-10-CM | POA: Diagnosis not present

## 2021-03-30 DIAGNOSIS — I509 Heart failure, unspecified: Secondary | ICD-10-CM | POA: Diagnosis not present

## 2021-03-30 DIAGNOSIS — I4891 Unspecified atrial fibrillation: Secondary | ICD-10-CM | POA: Diagnosis not present

## 2021-03-30 DIAGNOSIS — K5792 Diverticulitis of intestine, part unspecified, without perforation or abscess without bleeding: Secondary | ICD-10-CM | POA: Diagnosis not present

## 2021-03-30 DIAGNOSIS — I110 Hypertensive heart disease with heart failure: Secondary | ICD-10-CM | POA: Diagnosis not present

## 2021-03-30 DIAGNOSIS — E43 Unspecified severe protein-calorie malnutrition: Secondary | ICD-10-CM | POA: Diagnosis not present

## 2021-03-30 DIAGNOSIS — N179 Acute kidney failure, unspecified: Secondary | ICD-10-CM | POA: Diagnosis not present

## 2021-03-30 DIAGNOSIS — M1990 Unspecified osteoarthritis, unspecified site: Secondary | ICD-10-CM | POA: Diagnosis not present

## 2021-03-30 DIAGNOSIS — D509 Iron deficiency anemia, unspecified: Secondary | ICD-10-CM | POA: Diagnosis not present

## 2021-03-31 DIAGNOSIS — N179 Acute kidney failure, unspecified: Secondary | ICD-10-CM | POA: Diagnosis not present

## 2021-03-31 DIAGNOSIS — I509 Heart failure, unspecified: Secondary | ICD-10-CM | POA: Diagnosis not present

## 2021-03-31 DIAGNOSIS — I4891 Unspecified atrial fibrillation: Secondary | ICD-10-CM | POA: Diagnosis not present

## 2021-03-31 DIAGNOSIS — I110 Hypertensive heart disease with heart failure: Secondary | ICD-10-CM | POA: Diagnosis not present

## 2021-03-31 DIAGNOSIS — E43 Unspecified severe protein-calorie malnutrition: Secondary | ICD-10-CM | POA: Diagnosis not present

## 2021-03-31 DIAGNOSIS — D509 Iron deficiency anemia, unspecified: Secondary | ICD-10-CM | POA: Diagnosis not present

## 2021-03-31 DIAGNOSIS — M1990 Unspecified osteoarthritis, unspecified site: Secondary | ICD-10-CM | POA: Diagnosis not present

## 2021-03-31 DIAGNOSIS — M810 Age-related osteoporosis without current pathological fracture: Secondary | ICD-10-CM | POA: Diagnosis not present

## 2021-03-31 DIAGNOSIS — K5792 Diverticulitis of intestine, part unspecified, without perforation or abscess without bleeding: Secondary | ICD-10-CM | POA: Diagnosis not present

## 2021-04-05 DIAGNOSIS — F411 Generalized anxiety disorder: Secondary | ICD-10-CM | POA: Diagnosis not present

## 2021-04-05 DIAGNOSIS — Z681 Body mass index (BMI) 19 or less, adult: Secondary | ICD-10-CM | POA: Diagnosis not present

## 2021-04-05 DIAGNOSIS — F331 Major depressive disorder, recurrent, moderate: Secondary | ICD-10-CM | POA: Diagnosis not present

## 2021-04-05 DIAGNOSIS — Z79899 Other long term (current) drug therapy: Secondary | ICD-10-CM | POA: Diagnosis not present

## 2021-04-06 DIAGNOSIS — D509 Iron deficiency anemia, unspecified: Secondary | ICD-10-CM | POA: Diagnosis not present

## 2021-04-06 DIAGNOSIS — M1990 Unspecified osteoarthritis, unspecified site: Secondary | ICD-10-CM | POA: Diagnosis not present

## 2021-04-06 DIAGNOSIS — M810 Age-related osteoporosis without current pathological fracture: Secondary | ICD-10-CM | POA: Diagnosis not present

## 2021-04-06 DIAGNOSIS — I110 Hypertensive heart disease with heart failure: Secondary | ICD-10-CM | POA: Diagnosis not present

## 2021-04-06 DIAGNOSIS — I4891 Unspecified atrial fibrillation: Secondary | ICD-10-CM | POA: Diagnosis not present

## 2021-04-06 DIAGNOSIS — K5792 Diverticulitis of intestine, part unspecified, without perforation or abscess without bleeding: Secondary | ICD-10-CM | POA: Diagnosis not present

## 2021-04-06 DIAGNOSIS — I509 Heart failure, unspecified: Secondary | ICD-10-CM | POA: Diagnosis not present

## 2021-04-06 DIAGNOSIS — E43 Unspecified severe protein-calorie malnutrition: Secondary | ICD-10-CM | POA: Diagnosis not present

## 2021-04-06 DIAGNOSIS — N179 Acute kidney failure, unspecified: Secondary | ICD-10-CM | POA: Diagnosis not present

## 2021-04-08 ENCOUNTER — Telehealth: Payer: Self-pay | Admitting: Cardiology

## 2021-04-08 NOTE — Telephone Encounter (Signed)
Having trouble getting signal for her medtronic device. Please advise [phone]  Thank you!

## 2021-04-08 NOTE — Telephone Encounter (Signed)
No answer/ no voicemail. I called twice.

## 2021-04-11 ENCOUNTER — Other Ambulatory Visit: Payer: Self-pay

## 2021-04-11 ENCOUNTER — Ambulatory Visit (INDEPENDENT_AMBULATORY_CARE_PROVIDER_SITE_OTHER): Payer: Medicare HMO | Admitting: Cardiology

## 2021-04-11 DIAGNOSIS — M810 Age-related osteoporosis without current pathological fracture: Secondary | ICD-10-CM | POA: Diagnosis not present

## 2021-04-11 DIAGNOSIS — I110 Hypertensive heart disease with heart failure: Secondary | ICD-10-CM | POA: Diagnosis not present

## 2021-04-11 DIAGNOSIS — I509 Heart failure, unspecified: Secondary | ICD-10-CM | POA: Diagnosis not present

## 2021-04-11 DIAGNOSIS — K5792 Diverticulitis of intestine, part unspecified, without perforation or abscess without bleeding: Secondary | ICD-10-CM | POA: Diagnosis not present

## 2021-04-11 DIAGNOSIS — E43 Unspecified severe protein-calorie malnutrition: Secondary | ICD-10-CM | POA: Diagnosis not present

## 2021-04-11 DIAGNOSIS — N179 Acute kidney failure, unspecified: Secondary | ICD-10-CM | POA: Diagnosis not present

## 2021-04-11 DIAGNOSIS — M1990 Unspecified osteoarthritis, unspecified site: Secondary | ICD-10-CM | POA: Diagnosis not present

## 2021-04-11 DIAGNOSIS — I4891 Unspecified atrial fibrillation: Secondary | ICD-10-CM | POA: Diagnosis not present

## 2021-04-11 DIAGNOSIS — R001 Bradycardia, unspecified: Secondary | ICD-10-CM

## 2021-04-11 DIAGNOSIS — D509 Iron deficiency anemia, unspecified: Secondary | ICD-10-CM | POA: Diagnosis not present

## 2021-04-11 NOTE — Progress Notes (Signed)
Nurse visit pacemaker check in clinic for battery voltage. Normal device function. Thresholds, sensing, impedances consistent with previous measurements. Device programmed to maximize longevity. No mode switch or high ventricular rates noted. Device programmed at appropriate safety margins. Histogram distribution appropriate for patient activity level. Device programmed to optimize intrinsic conduction. Estimated longevity <1 month. Patient does not participate in remote monitoring. Patient will be scheduled to return to discuss gen change. Patient education completed.

## 2021-04-13 ENCOUNTER — Telehealth: Payer: Self-pay

## 2021-04-13 NOTE — Telephone Encounter (Signed)
The patient spoke with Daniel Nones in the office this week and She will discuss remote monitoring after gen change.

## 2021-04-21 DIAGNOSIS — I110 Hypertensive heart disease with heart failure: Secondary | ICD-10-CM | POA: Diagnosis not present

## 2021-04-21 DIAGNOSIS — I509 Heart failure, unspecified: Secondary | ICD-10-CM | POA: Diagnosis not present

## 2021-04-21 DIAGNOSIS — D509 Iron deficiency anemia, unspecified: Secondary | ICD-10-CM | POA: Diagnosis not present

## 2021-04-21 DIAGNOSIS — I4891 Unspecified atrial fibrillation: Secondary | ICD-10-CM | POA: Diagnosis not present

## 2021-04-21 DIAGNOSIS — M1990 Unspecified osteoarthritis, unspecified site: Secondary | ICD-10-CM | POA: Diagnosis not present

## 2021-04-21 DIAGNOSIS — E43 Unspecified severe protein-calorie malnutrition: Secondary | ICD-10-CM | POA: Diagnosis not present

## 2021-04-21 DIAGNOSIS — N179 Acute kidney failure, unspecified: Secondary | ICD-10-CM | POA: Diagnosis not present

## 2021-04-21 DIAGNOSIS — K5792 Diverticulitis of intestine, part unspecified, without perforation or abscess without bleeding: Secondary | ICD-10-CM | POA: Diagnosis not present

## 2021-04-21 DIAGNOSIS — M810 Age-related osteoporosis without current pathological fracture: Secondary | ICD-10-CM | POA: Diagnosis not present

## 2021-04-26 DIAGNOSIS — Z681 Body mass index (BMI) 19 or less, adult: Secondary | ICD-10-CM | POA: Diagnosis not present

## 2021-04-26 DIAGNOSIS — M810 Age-related osteoporosis without current pathological fracture: Secondary | ICD-10-CM | POA: Diagnosis not present

## 2021-04-26 DIAGNOSIS — M159 Polyosteoarthritis, unspecified: Secondary | ICD-10-CM | POA: Diagnosis not present

## 2021-04-28 DIAGNOSIS — M1990 Unspecified osteoarthritis, unspecified site: Secondary | ICD-10-CM | POA: Diagnosis not present

## 2021-04-28 DIAGNOSIS — D509 Iron deficiency anemia, unspecified: Secondary | ICD-10-CM | POA: Diagnosis not present

## 2021-04-28 DIAGNOSIS — M810 Age-related osteoporosis without current pathological fracture: Secondary | ICD-10-CM | POA: Diagnosis not present

## 2021-04-28 DIAGNOSIS — N179 Acute kidney failure, unspecified: Secondary | ICD-10-CM | POA: Diagnosis not present

## 2021-04-28 DIAGNOSIS — I110 Hypertensive heart disease with heart failure: Secondary | ICD-10-CM | POA: Diagnosis not present

## 2021-04-28 DIAGNOSIS — K5792 Diverticulitis of intestine, part unspecified, without perforation or abscess without bleeding: Secondary | ICD-10-CM | POA: Diagnosis not present

## 2021-04-28 DIAGNOSIS — I509 Heart failure, unspecified: Secondary | ICD-10-CM | POA: Diagnosis not present

## 2021-04-28 DIAGNOSIS — I4891 Unspecified atrial fibrillation: Secondary | ICD-10-CM | POA: Diagnosis not present

## 2021-04-28 DIAGNOSIS — E43 Unspecified severe protein-calorie malnutrition: Secondary | ICD-10-CM | POA: Diagnosis not present

## 2021-05-07 DIAGNOSIS — J40 Bronchitis, not specified as acute or chronic: Secondary | ICD-10-CM | POA: Diagnosis not present

## 2021-05-07 DIAGNOSIS — R262 Difficulty in walking, not elsewhere classified: Secondary | ICD-10-CM | POA: Diagnosis not present

## 2021-05-07 DIAGNOSIS — I700 Atherosclerosis of aorta: Secondary | ICD-10-CM | POA: Diagnosis not present

## 2021-05-07 DIAGNOSIS — R2689 Other abnormalities of gait and mobility: Secondary | ICD-10-CM | POA: Diagnosis not present

## 2021-05-07 DIAGNOSIS — M4854XD Collapsed vertebra, not elsewhere classified, thoracic region, subsequent encounter for fracture with routine healing: Secondary | ICD-10-CM | POA: Diagnosis not present

## 2021-05-07 DIAGNOSIS — R911 Solitary pulmonary nodule: Secondary | ICD-10-CM | POA: Diagnosis not present

## 2021-05-07 DIAGNOSIS — R410 Disorientation, unspecified: Secondary | ICD-10-CM | POA: Diagnosis not present

## 2021-05-07 DIAGNOSIS — R079 Chest pain, unspecified: Secondary | ICD-10-CM | POA: Diagnosis not present

## 2021-05-07 DIAGNOSIS — R0781 Pleurodynia: Secondary | ICD-10-CM | POA: Diagnosis not present

## 2021-05-07 DIAGNOSIS — R109 Unspecified abdominal pain: Secondary | ICD-10-CM | POA: Diagnosis not present

## 2021-05-07 DIAGNOSIS — S2232XA Fracture of one rib, left side, initial encounter for closed fracture: Secondary | ICD-10-CM | POA: Diagnosis not present

## 2021-05-07 DIAGNOSIS — R918 Other nonspecific abnormal finding of lung field: Secondary | ICD-10-CM | POA: Diagnosis not present

## 2021-05-07 DIAGNOSIS — R636 Underweight: Secondary | ICD-10-CM | POA: Diagnosis not present

## 2021-05-31 DIAGNOSIS — G629 Polyneuropathy, unspecified: Secondary | ICD-10-CM | POA: Diagnosis not present

## 2021-05-31 DIAGNOSIS — I495 Sick sinus syndrome: Secondary | ICD-10-CM | POA: Diagnosis not present

## 2021-05-31 DIAGNOSIS — M810 Age-related osteoporosis without current pathological fracture: Secondary | ICD-10-CM | POA: Diagnosis not present

## 2021-05-31 DIAGNOSIS — Z681 Body mass index (BMI) 19 or less, adult: Secondary | ICD-10-CM | POA: Diagnosis not present

## 2021-05-31 DIAGNOSIS — D692 Other nonthrombocytopenic purpura: Secondary | ICD-10-CM | POA: Diagnosis not present

## 2021-05-31 DIAGNOSIS — I4891 Unspecified atrial fibrillation: Secondary | ICD-10-CM | POA: Diagnosis not present

## 2021-05-31 DIAGNOSIS — E46 Unspecified protein-calorie malnutrition: Secondary | ICD-10-CM | POA: Diagnosis not present

## 2021-05-31 DIAGNOSIS — E559 Vitamin D deficiency, unspecified: Secondary | ICD-10-CM | POA: Diagnosis not present

## 2021-05-31 DIAGNOSIS — E7800 Pure hypercholesterolemia, unspecified: Secondary | ICD-10-CM | POA: Diagnosis not present

## 2021-05-31 DIAGNOSIS — N183 Chronic kidney disease, stage 3 unspecified: Secondary | ICD-10-CM | POA: Diagnosis not present

## 2021-06-09 ENCOUNTER — Encounter: Payer: Self-pay | Admitting: Cardiology

## 2021-06-09 ENCOUNTER — Other Ambulatory Visit: Payer: Self-pay

## 2021-06-09 ENCOUNTER — Ambulatory Visit: Payer: Medicare HMO | Admitting: Cardiology

## 2021-06-09 VITALS — BP 120/62 | HR 62 | Ht <= 58 in | Wt 77.4 lb

## 2021-06-09 DIAGNOSIS — Z950 Presence of cardiac pacemaker: Secondary | ICD-10-CM

## 2021-06-09 DIAGNOSIS — N289 Disorder of kidney and ureter, unspecified: Secondary | ICD-10-CM

## 2021-06-09 DIAGNOSIS — I480 Paroxysmal atrial fibrillation: Secondary | ICD-10-CM

## 2021-06-09 DIAGNOSIS — E7800 Pure hypercholesterolemia, unspecified: Secondary | ICD-10-CM

## 2021-06-09 DIAGNOSIS — R0781 Pleurodynia: Secondary | ICD-10-CM | POA: Diagnosis not present

## 2021-06-09 NOTE — Progress Notes (Signed)
Cardiology Office Note:    Date:  06/09/2021   ID:  [name], DOB [birth year], MRN [id number]  PCP:  Wilmer Floor., MD  Cardiologist:  Gypsy Balsam, MD    Referring MD: Wilmer Floor., MD   Chief Complaint  Patient presents with   Follow-up  I am sore in the left side of my chest  History of Present Illness:    Heather Thomas is a 87 y.o. female with past medical history significant for paroxysmal atrial fibrillation, she is not anticoagulant because of fragile state, essential hypertension, pacemaker present which is Medtronic device.  She comes today to my office follow-up.  Recently she ended going to the emergency room because of pain in the left side of her chest.  She was find to have a broken rib which is probably chronic.  Overall she seems to be doing better but complain of being weak tired and exhausted after admit to have a little bit difficulty communicating with her.  She is in the room with her family member who lives with her her next house actually he get better understanding what is going on the issue that I have is per pacemaker status.  There is a note in the chart from 11 July saying that she got only 1 month left in the device.  I see another note from May of this year saying that is 7 months left.  Overall she is doing well but I will make arrangements for her pacemaker interrogation  Past Medical History:  Diagnosis Date   A-fib (HCC)    Acid reflux    AF (paroxysmal atrial fibrillation) (HCC)    Anemia    Atypical chest pain 03/30/2016   Bradycardia 08/12/2015   DJD (degenerative joint disease)    History of atrial fibrillation 12/05/2018   on pacemaker   History of esophageal reflux 12/05/2018   History of osteoporosis 12/05/2018   History of vertebral fracture 12/05/2018   C1 and C2 fracture s/p fall in 2017 h/o of multiple compression fractures in lumbar region   Hypercholesteremia 12/05/2018   Hypertrophic and atrophic condition of skin 10/07/2019    Ingrowing left great toenail 10/07/2019   Osteoporosis    Pacemaker 08/12/2015   PAF (paroxysmal atrial fibrillation) (HCC) 08/21/2017   Pain due to onychomycosis of toenails of both feet 10/07/2019   Plantar callus 10/07/2019   PMR (polymyalgia rheumatica) (HCC)    Post-nasal drip 02/21/2018   Primary osteoarthritis of both hands 12/05/2018   Renal insufficiency 12/05/2018   Vitamin D deficiency 12/05/2018    Past Surgical History:  Procedure Laterality Date   ABDOMINAL HYSTERECTOMY     COLONOSCOPY  07/09/2003   Colonic sigmoid diverticulosis. Otherwise normal attempted colonoscopy to 35cm.    ESOPHAGOGASTRODUODENOSCOPY  10/15/2014   Erosive gastritis. Otherwise normal EGD,    HIP FRACTURE SURGERY Right    INSERT / REPLACE / REMOVE PACEMAKER     Medtronic    Current Medications: Current Meds  Medication Sig   denosumab (PROLIA) 60 MG/ML SOSY injection Inject 60 mg into the skin every 6 (six) months. Due 08/2020   dicyclomine (BENTYL) 10 MG capsule Take 1 capsule (10 mg total) by mouth 2 (two) times daily as needed for spasms.   famotidine (PEPCID) 40 MG tablet Take 1 tablet (40 mg total) by mouth daily.   ibuprofen (ADVIL) 400 MG tablet Take 2 tablets (800 mg total) by mouth every 8 (eight) hours as needed. (Patient taking differently: Take  800 mg by mouth every 8 (eight) hours as needed for mild pain or moderate pain.)   nitroGLYCERIN (NITROSTAT) 0.4 MG SL tablet Place 1 tablet (0.4 mg total) under the tongue every 5 (five) minutes as needed for chest pain.   sotalol (BETAPACE) 80 MG tablet TAKE 1/2 TABLET(40 MG) BY MOUTH TWICE DAILY (Patient taking differently: Take 80 mg by mouth 2 (two) times daily.)     Allergies:   Codeine   Social History   Socioeconomic History   Marital status: Married    Spouse name: Not on file   Number of children: 3   Years of education: Not on file   Highest education level: Not on file  Occupational History   Not on file  Tobacco Use   Smoking  status: Never   Smokeless tobacco: Never  Vaping Use   Vaping Use: Never used  Substance and Sexual Activity   Alcohol use: No   Drug use: No   Sexual activity: Not on file  Other Topics Concern   Not on file  Social History Narrative   Not on file   Social Determinants of Health   Financial Resource Strain: Not on file  Food Insecurity: Not on file  Transportation Needs: Not on file  Physical Activity: Not on file  Stress: Not on file  Social Connections: Not on file     Family History: The patient's family history includes Cervical cancer in her daughter; Diabetes in her daughter; Heart attack in her mother; Heart disease in her daughter; Lung disease in her son. There is no history of Colon cancer or Esophageal cancer. ROS:   Please see the history of present illness.    All 14 point review of systems negative except as described per history of present illness  EKGs/Labs/Other Studies Reviewed:      Recent Labs: No results found for requested labs within last 8760 hours.  Recent Lipid Panel No results found for: CHOL, TRIG, HDL, CHOLHDL, VLDL, LDLCALC, LDLDIRECT  Physical Exam:    VS:  BP 120/62 (BP Location: Left Arm, Patient Position: Sitting)   Pulse 62   Ht 4\' 8"  (1.422 m)   Wt 77 lb 6.4 oz (35.1 kg)   SpO2 94%   BMI 17.35 kg/m     Wt Readings from Last 3 Encounters:  06/09/21 77 lb 6.4 oz (35.1 kg)  03/29/21 80 lb 6 oz (36.5 kg)  03/27/21 87 lb (39.5 kg)     GEN: Fragile, i skinny, hard of hearing. HEENT: Normal NECK: No JVD; No carotid bruits LYMPHATICS: No lymphadenopathy CARDIAC: RRR, no murmurs, no rubs, no gallops RESPIRATORY:  Clear to auscultation without rales, wheezing or rhonchi  ABDOMEN: Soft, non-tender, non-distended MUSCULOSKELETAL:  No edema; No deformity  SKIN: Warm and dry LOWER EXTREMITIES: no swelling NEUROLOGIC:  Alert and oriented x 3 PSYCHIATRIC:  Normal affect   ASSESSMENT:    1. PAF (paroxysmal atrial fibrillation)  (HCC)   2. Renal insufficiency   3. Pacemaker   4. Hypercholesteremia    PLAN:    In order of problems listed above:  Paroxysmal atrial fibrillation.  Seems to maintain sinus rhythm.  She is not anticoagulate because of her fragile state. Pacemaker present I will make arrangements for interrogation of the Medtronic device. Hypercholesterolemia.  Because of her advanced age only diet is implemented. She does have remote system for pacemaker interrogation before I will bring her to our office to interrogate her device. I did review record from the  emergency room for this visit   Medication Adjustments/Labs and Tests Ordered: Current medicines are reviewed at length with the patient today.  Concerns regarding medicines are outlined above.  No orders of the defined types were placed in this encounter.  Medication changes: No orders of the defined types were placed in this encounter.   Signed, Georgeanna Lea, MD, [facility] 06/09/2021 11:49 AM    [facility]

## 2021-06-09 NOTE — Patient Instructions (Signed)

## 2021-06-14 DIAGNOSIS — F411 Generalized anxiety disorder: Secondary | ICD-10-CM | POA: Diagnosis not present

## 2021-06-14 DIAGNOSIS — Z79899 Other long term (current) drug therapy: Secondary | ICD-10-CM | POA: Diagnosis not present

## 2021-06-14 DIAGNOSIS — R0789 Other chest pain: Secondary | ICD-10-CM | POA: Diagnosis not present

## 2021-07-11 DIAGNOSIS — J9811 Atelectasis: Secondary | ICD-10-CM | POA: Diagnosis not present

## 2021-07-11 DIAGNOSIS — R0789 Other chest pain: Secondary | ICD-10-CM | POA: Diagnosis not present

## 2021-07-11 DIAGNOSIS — M4854XA Collapsed vertebra, not elsewhere classified, thoracic region, initial encounter for fracture: Secondary | ICD-10-CM | POA: Diagnosis not present

## 2021-07-11 DIAGNOSIS — I700 Atherosclerosis of aorta: Secondary | ICD-10-CM | POA: Diagnosis not present

## 2021-07-11 DIAGNOSIS — U071 COVID-19: Secondary | ICD-10-CM | POA: Diagnosis not present

## 2021-07-11 DIAGNOSIS — Z950 Presence of cardiac pacemaker: Secondary | ICD-10-CM | POA: Diagnosis not present

## 2021-07-11 DIAGNOSIS — R911 Solitary pulmonary nodule: Secondary | ICD-10-CM | POA: Diagnosis not present

## 2021-07-11 DIAGNOSIS — R918 Other nonspecific abnormal finding of lung field: Secondary | ICD-10-CM | POA: Diagnosis not present

## 2021-07-12 ENCOUNTER — Telehealth: Payer: Self-pay | Admitting: Cardiology

## 2021-07-12 ENCOUNTER — Telehealth: Payer: Self-pay

## 2021-07-12 NOTE — Telephone Encounter (Signed)
Patient called in to say that her pacemaker moved a little bit. Transfer to the device

## 2021-07-12 NOTE — Telephone Encounter (Signed)
2 attempts to contact patient. No answer, unable to leave VM. Will try back at later time.

## 2021-07-12 NOTE — Telephone Encounter (Signed)
Patient called in stating her ppm moved in her chest. She was seen in the ed yesterday and they did a xray and it was confirmed that it has moved and she states that it hurts her and would like a call back asap

## 2021-07-13 ENCOUNTER — Telehealth: Payer: Self-pay | Admitting: Cardiology

## 2021-07-13 DIAGNOSIS — Z01812 Encounter for preprocedural laboratory examination: Secondary | ICD-10-CM

## 2021-07-13 DIAGNOSIS — I495 Sick sinus syndrome: Secondary | ICD-10-CM

## 2021-07-13 NOTE — Telephone Encounter (Signed)
Successful telephone encounter to patient to follow up on complaint of PPM "moving". Provided reassurance that per radiology report her device was stable in her chest. Asked patient why she thought her device had moved and patient replied because it is hurting in my neck. Per grandson, patient sustained recent fall and presented to ED Monday. Of note, discovered patient's PPM battery had <1 month till ERI in June. Patient is NOT PPM dependent. Discussed with Dr. Gershon Crane RN who contacted patient and grandson for scheduling gen change.

## 2021-07-13 NOTE — Telephone Encounter (Signed)
[name] is calling requesting to speak with someone in regards to scheduling his grandmothers battery replacement.

## 2021-07-13 NOTE — Telephone Encounter (Signed)
Patient states last Saturday she was taken into the hospital and had tests done. She says has a pacemaker and felt like it slipped. She would like to know if the test results from the hospital were received by the office. She says she would like a call back from a nurse before 12:00 pm today. She says to not leave a message, because her voicemail won't work. She also requests a call back from Dr. Bing Matter when he is back in the office.

## 2021-07-13 NOTE — Telephone Encounter (Signed)
Unable to scan ED record. If needed we can fax a copy.

## 2021-07-13 NOTE — Telephone Encounter (Signed)
Spoke to [place] and pt Pt will stop by [employer] office Friday for pre procedure blood work. Instructions reviewed verbally.      Aware NPO after MN night before      Aware to arrive to [facility] at 10:30 am morning of      Aware to hold medication morning of      Aware someone will need to be w/ her for 24 hours post d/c  Patient & grandson verbalized understanding and agreeable to plan.

## 2021-07-13 NOTE — Telephone Encounter (Signed)
Spoke with pt who is concerned that her pacemaker is not working or has moved.  IMPRESSION of chest CT done at [facility]. 1. Stable position of the left chest wall cardiac device and  associated leads without evidence of complication. No other acute  abnormality in the left axillary region to explain the patient's  pain.   I have printed [facility] ED record and will see if we can get it scanned into the chart.

## 2021-07-14 ENCOUNTER — Other Ambulatory Visit: Payer: Self-pay | Admitting: Cardiology

## 2021-07-15 ENCOUNTER — Telehealth: Payer: Self-pay | Admitting: Cardiology

## 2021-07-15 NOTE — Telephone Encounter (Signed)
Heather Thomas is calling stating she cannot make this appointment today due to lack of transportation. She states her grandson cannot take her today due to being sick. She is requesting she be called back to reschedule instead of her grandson.

## 2021-07-15 NOTE — Telephone Encounter (Signed)
Pt unable to get labs done today Informed pt that as long as can get done as early as possible on Monday should be fine if cannot however will need to call and have procedure cx (gen change) on 07/19/21 the patient verbalized understanding ./cy

## 2021-07-16 ENCOUNTER — Telehealth: Payer: Self-pay | Admitting: Medical

## 2021-07-16 NOTE — Telephone Encounter (Signed)
Patient called stating she forgot to get her labs done yesterday in preparation for procedure 07/19/21 generator changout. I said it was likely OK to wait until Monday since procedure is elective. She was very worried, suspect some level of dementia. I said I will let the MD know. Also called grandson, no answer.   Heather Thomas Fransico Michael, PA-C

## 2021-07-18 DIAGNOSIS — I495 Sick sinus syndrome: Secondary | ICD-10-CM | POA: Diagnosis not present

## 2021-07-18 DIAGNOSIS — Z01812 Encounter for preprocedural laboratory examination: Secondary | ICD-10-CM | POA: Diagnosis not present

## 2021-07-18 LAB — BASIC METABOLIC PANEL
BUN/Creatinine Ratio: 20 (ref 12–28)
BUN: 25 mg/dL (ref 10–36)
CO2: 25 mmol/L (ref 20–29)
Calcium: 9.6 mg/dL (ref 8.7–10.3)
Chloride: 101 mmol/L (ref 96–106)
Creatinine, Ser: 1.22 mg/dL — ABNORMAL HIGH (ref 0.57–1.00)
Glucose: 85 mg/dL (ref 70–99)
Potassium: 5.3 mmol/L — ABNORMAL HIGH (ref 3.5–5.2)
Sodium: 138 mmol/L (ref 134–144)
eGFR: 42 mL/min/{1.73_m2} — ABNORMAL LOW (ref >59)

## 2021-07-18 LAB — CBC
Hematocrit: 34.6 % (ref 34.0–46.6)
Hemoglobin: 11.6 g/dL (ref 11.1–15.9)
MCH: 30.6 pg (ref 26.6–33.0)
MCHC: 33.5 g/dL (ref 31.5–35.7)
MCV: 91 fL (ref 79–97)
Platelets: 315 10*3/uL (ref 150–450)
RBC: 3.79 x10E6/uL (ref 3.77–5.28)
RDW: 13.1 % (ref 11.7–15.4)
WBC: 6.4 10*3/uL (ref 3.4–10.8)

## 2021-07-18 NOTE — Pre-Procedure Instructions (Signed)
Attempted to call patient regarding procedure instructions.  No answer 

## 2021-07-18 NOTE — Telephone Encounter (Signed)
Pt reports that she stopped by the office this morning for blood work.

## 2021-07-19 ENCOUNTER — Other Ambulatory Visit: Payer: Self-pay

## 2021-07-19 ENCOUNTER — Encounter (HOSPITAL_COMMUNITY)
Admission: RE | Disposition: A | Discharge status: Home / Self Care | Payer: Self-pay | Source: Home / Self Care | Attending: Cardiology

## 2021-07-19 ENCOUNTER — Ambulatory Visit (HOSPITAL_COMMUNITY)
Admission: RE | Admit: 2021-07-19 | Discharge: 2021-07-19 | Disposition: A | Discharge status: Home / Self Care | Payer: Medicare HMO | Attending: Cardiology | Admitting: Cardiology

## 2021-07-19 DIAGNOSIS — I480 Paroxysmal atrial fibrillation: Secondary | ICD-10-CM | POA: Insufficient documentation

## 2021-07-19 DIAGNOSIS — I495 Sick sinus syndrome: Secondary | ICD-10-CM | POA: Diagnosis not present

## 2021-07-19 DIAGNOSIS — R001 Bradycardia, unspecified: Secondary | ICD-10-CM

## 2021-07-19 DIAGNOSIS — E7800 Pure hypercholesterolemia, unspecified: Secondary | ICD-10-CM | POA: Insufficient documentation

## 2021-07-19 DIAGNOSIS — Z885 Allergy status to narcotic agent status: Secondary | ICD-10-CM | POA: Diagnosis not present

## 2021-07-19 DIAGNOSIS — Z45010 Encounter for checking and testing of cardiac pacemaker pulse generator [battery]: Secondary | ICD-10-CM | POA: Diagnosis not present

## 2021-07-19 HISTORY — PX: PPM GENERATOR CHANGEOUT: EP1233

## 2021-07-19 SURGERY — PPM GENERATOR CHANGEOUT

## 2021-07-19 MED ORDER — SODIUM CHLORIDE 0.9 % IV SOLN: INTRAVENOUS | Status: DC

## 2021-07-19 MED ORDER — CHLORHEXIDINE GLUCONATE 4 % EX LIQD
60.0000 mL | Freq: Once | CUTANEOUS | Status: DC
  Filled 2021-07-19: qty 60

## 2021-07-19 MED ORDER — LIDOCAINE HCL (PF) 1 % IJ SOLN
INTRAMUSCULAR | Status: AC
  Filled 2021-07-19: qty 60

## 2021-07-19 MED ORDER — LIDOCAINE HCL (PF) 1 % IJ SOLN
INTRAMUSCULAR | Status: DC | PRN
  Administered 2021-07-19: 60 mL via INTRADERMAL

## 2021-07-19 MED ORDER — CEFAZOLIN SODIUM-DEXTROSE 2-4 GM/100ML-% IV SOLN
INTRAVENOUS | Status: AC
  Filled 2021-07-19: qty 100

## 2021-07-19 MED ORDER — CEFAZOLIN SODIUM-DEXTROSE 2-4 GM/100ML-% IV SOLN
2.0000 g | INTRAVENOUS | Status: AC
  Administered 2021-07-19: 2 g via INTRAVENOUS

## 2021-07-19 MED ORDER — SODIUM CHLORIDE 0.9 % IV SOLN: 80.0000 mg | INTRAVENOUS | Status: DC

## 2021-07-19 MED ORDER — ACETAMINOPHEN 325 MG PO TABS: 325.0000 mg | ORAL_TABLET | ORAL | Status: DC | PRN

## 2021-07-19 MED ORDER — ONDANSETRON HCL 4 MG/2ML IJ SOLN: 4.0000 mg | Freq: Four times a day (QID) | INTRAMUSCULAR | Status: DC | PRN

## 2021-07-19 MED ORDER — SODIUM CHLORIDE 0.9 % IV SOLN
INTRAVENOUS | Status: AC
  Filled 2021-07-19: qty 2

## 2021-07-19 SURGICAL SUPPLY — 5 items
CABLE SURGICAL S-101-97-12 (CABLE) ×2 IMPLANT
IPG PACE AZUR XT DR MRI W1DR01 (Pacemaker) IMPLANT
PACE AZURE XT DR MRI W1DR01 (Pacemaker) ×2 IMPLANT
PAD PRO RADIOLUCENT 2001M-C (PAD) ×2 IMPLANT
TRAY PACEMAKER INSERTION (PACKS) ×2 IMPLANT

## 2021-07-19 NOTE — H&P (Signed)
Electrophysiology Office Note   Date:  07/19/2021   ID:  Heather Thomas, DOB 05-14-1927, MRN [id number]  PCP:  Wilmer Floor., MD  Cardiologist:  Bing Matter Primary Electrophysiologist:  Maxamillian Tienda Jorja Loa, MD    Chief Complaint: atrial fibrillation   History of Present Illness: Heather Thomas is a 87 y.o. female who is being seen today for the evaluation of atrial fibrillation at the request of No ref. provider found. Presenting today for electrophysiology evaluation.  She has a history significant for paroxysmal atrial fibrillation, hyperlipidemia.  She is status post Medtronic dual-chamber pacemaker implanted for sick sinus syndrome.  She is currently on sotalol for her atrial fibrillation.  Today, denies symptoms of palpitations, chest pain, shortness of breath, orthopnea, PND, lower extremity edema, claudication, dizziness, presyncope, syncope, bleeding, or neurologic sequela. The patient is tolerating medications without difficulties. Gen change today.    Past Medical History:  Diagnosis Date   A-fib (HCC)    Acid reflux    AF (paroxysmal atrial fibrillation) (HCC)    Anemia    Atypical chest pain 03/30/2016   Bradycardia 08/12/2015   DJD (degenerative joint disease)    History of atrial fibrillation 12/05/2018   on pacemaker   History of esophageal reflux 12/05/2018   History of osteoporosis 12/05/2018   History of vertebral fracture 12/05/2018   C1 and C2 fracture s/p fall in 2017 h/o of multiple compression fractures in lumbar region   Hypercholesteremia 12/05/2018   Hypertrophic and atrophic condition of skin 10/07/2019   Ingrowing left great toenail 10/07/2019   Osteoporosis    Pacemaker 08/12/2015   PAF (paroxysmal atrial fibrillation) (HCC) 08/21/2017   Pain due to onychomycosis of toenails of both feet 10/07/2019   Plantar callus 10/07/2019   PMR (polymyalgia rheumatica) (HCC)    Post-nasal drip 02/21/2018   Primary osteoarthritis of both hands 12/05/2018   Renal  insufficiency 12/05/2018   Vitamin D deficiency 12/05/2018   Past Surgical History:  Procedure Laterality Date   ABDOMINAL HYSTERECTOMY     COLONOSCOPY  07/09/2003   Colonic sigmoid diverticulosis. Otherwise normal attempted colonoscopy to 35cm.    ESOPHAGOGASTRODUODENOSCOPY  10/15/2014   Erosive gastritis. Otherwise normal EGD,    HIP FRACTURE SURGERY Right    INSERT / REPLACE / REMOVE PACEMAKER     Medtronic     Current Facility-Administered Medications  Medication Dose Route Frequency Provider Last Rate Last Admin   0.9 %  sodium chloride infusion   Intravenous Continuous Rayland Hamed, Andree Coss, MD 50 mL/hr at 07/19/21 1123 New Bag at 07/19/21 1123   ceFAZolin (ANCEF) IVPB 2g/100 mL premix  2 g Intravenous On Call Mayrani Khamis Daphine Deutscher, MD       chlorhexidine (HIBICLENS) 4 % liquid 4 application  60 mL Topical Once Park Beck Daphine Deutscher, MD       chlorhexidine (HIBICLENS) 4 % liquid 4 application  60 mL Topical Once Johan Creveling Daphine Deutscher, MD       gentamicin (GARAMYCIN) 80 mg in sodium chloride 0.9 % 500 mL irrigation  80 mg Irrigation On Call Tanji Storrs, Andree Coss, MD        Allergies:   Codeine   Social History:  The patient  reports that she has never smoked. She has never used smokeless tobacco. She reports that she does not drink alcohol and does not use drugs.   Family History:  The patient's family history includes Cervical cancer in her daughter; Diabetes in her daughter; Heart attack in her mother; Heart  disease in her daughter; Lung disease in her son.   ROS:  Please see the history of present illness.   Otherwise, review of systems is positive for none.   All other systems are reviewed and negative.   PHYSICAL EXAM: VS:  BP (!) 167/71   Pulse 69   Temp 97.6 F (36.4 C) (Oral)   Resp 15   Ht 4\' 8"  (1.422 m)   Wt 35.8 kg   SpO2 99%   BMI 17.71 kg/m  , BMI Body mass index is 17.71 kg/m. GEN: Well nourished, well developed, in no acute distress  HEENT: normal  Neck: no  JVD, carotid bruits, or masses Cardiac: RRR; no murmurs, rubs, or gallops,no edema  Respiratory:  clear to auscultation bilaterally, normal work of breathing GI: soft, nontender, nondistended, + BS MS: no deformity or atrophy  Skin: warm and dry Neuro:  Strength and sensation are intact Psych: euthymic mood, full affect   Recent Labs: 07/18/2021: BUN 25; Creatinine, Ser 1.22; Hemoglobin 11.6; Platelets 315; Potassium 5.3; Sodium 138    Lipid Panel  No results found for: CHOL, TRIG, HDL, CHOLHDL, VLDL, LDLCALC, LDLDIRECT   Wt Readings from Last 3 Encounters:  07/19/21 35.8 kg  06/09/21 35.1 kg  03/29/21 36.5 kg      Other studies Reviewed: Additional studies/ records that were reviewed today include: TTE 2017  Review of the above records today demonstrates:  The left ventricular size is normal. The left ventricular wall motion is normal. The right ventricle is normal in size and function. Device lead in the right ventricle The left atrial size is normal. Right atrial size is normal. There is aortic valve sclerosis. There is trace aortic regurgitation. There is trace mitral regurgitation. There is mild tricuspid regurgitation. There is no pericardial effusion.   ASSESSMENT AND PLAN:  1.  Paroxysmal atrial fibrillation: Currently on sotalol with a CHA2DS2-VASc of 3.  Not anticoagulated due to fall risk.  He has fortunately remained in normal rhythm.  No changes.  2.  Sick sinus syndrome: Heather Thomas has presented today for surgery, with the diagnosis of pacemaker ERI.  The various methods of treatment have been discussed with the patient and family. After consideration of risks, benefits and other options for treatment, the patient has consented to  Procedure(s): Pacemaker implant as a surgical intervention .  Risks include but not limited to bleeding, infection, pneumothorax, perforation, tamponade, vascular damage, renal failure, MI, stroke, death, and lead dislodgement .  The patient's history has been reviewed, patient examined, no change in status, stable for surgery.  I have reviewed the patient's chart and labs.  Questions were answered to the patient's satisfaction.    Kyllian Clingerman [name], MD 07/19/2021 1:49 PM

## 2021-07-19 NOTE — Discharge Instructions (Signed)

## 2021-07-20 ENCOUNTER — Encounter (HOSPITAL_COMMUNITY): Payer: Self-pay | Admitting: Cardiology

## 2021-07-23 DIAGNOSIS — M159 Polyosteoarthritis, unspecified: Secondary | ICD-10-CM | POA: Diagnosis not present

## 2021-07-23 DIAGNOSIS — Z681 Body mass index (BMI) 19 or less, adult: Secondary | ICD-10-CM | POA: Diagnosis not present

## 2021-07-24 DIAGNOSIS — S22070A Wedge compression fracture of T9-T10 vertebra, initial encounter for closed fracture: Secondary | ICD-10-CM | POA: Diagnosis not present

## 2021-07-24 DIAGNOSIS — M545 Low back pain, unspecified: Secondary | ICD-10-CM | POA: Diagnosis not present

## 2021-07-24 DIAGNOSIS — M438X4 Other specified deforming dorsopathies, thoracic region: Secondary | ICD-10-CM | POA: Diagnosis not present

## 2021-07-24 DIAGNOSIS — S2220XA Unspecified fracture of sternum, initial encounter for closed fracture: Secondary | ICD-10-CM | POA: Diagnosis not present

## 2021-07-24 DIAGNOSIS — R079 Chest pain, unspecified: Secondary | ICD-10-CM | POA: Diagnosis not present

## 2021-07-24 DIAGNOSIS — R457 State of emotional shock and stress, unspecified: Secondary | ICD-10-CM | POA: Diagnosis not present

## 2021-07-24 DIAGNOSIS — M40204 Unspecified kyphosis, thoracic region: Secondary | ICD-10-CM | POA: Diagnosis not present

## 2021-07-24 DIAGNOSIS — M549 Dorsalgia, unspecified: Secondary | ICD-10-CM | POA: Diagnosis not present

## 2021-07-26 DIAGNOSIS — S22070A Wedge compression fracture of T9-T10 vertebra, initial encounter for closed fracture: Secondary | ICD-10-CM | POA: Diagnosis not present

## 2021-07-26 DIAGNOSIS — Z79899 Other long term (current) drug therapy: Secondary | ICD-10-CM | POA: Diagnosis not present

## 2021-07-26 DIAGNOSIS — Z681 Body mass index (BMI) 19 or less, adult: Secondary | ICD-10-CM | POA: Diagnosis not present

## 2021-07-27 DIAGNOSIS — S22070A Wedge compression fracture of T9-T10 vertebra, initial encounter for closed fracture: Secondary | ICD-10-CM | POA: Diagnosis not present

## 2021-07-27 DIAGNOSIS — D649 Anemia, unspecified: Secondary | ICD-10-CM | POA: Diagnosis not present

## 2021-07-27 DIAGNOSIS — F09 Unspecified mental disorder due to known physiological condition: Secondary | ICD-10-CM | POA: Diagnosis not present

## 2021-07-27 DIAGNOSIS — R0781 Pleurodynia: Secondary | ICD-10-CM | POA: Diagnosis not present

## 2021-07-27 DIAGNOSIS — K5730 Diverticulosis of large intestine without perforation or abscess without bleeding: Secondary | ICD-10-CM | POA: Diagnosis not present

## 2021-07-27 DIAGNOSIS — R262 Difficulty in walking, not elsewhere classified: Secondary | ICD-10-CM | POA: Diagnosis not present

## 2021-07-27 DIAGNOSIS — R911 Solitary pulmonary nodule: Secondary | ICD-10-CM | POA: Diagnosis not present

## 2021-07-27 DIAGNOSIS — Z681 Body mass index (BMI) 19 or less, adult: Secondary | ICD-10-CM | POA: Diagnosis not present

## 2021-07-27 DIAGNOSIS — E43 Unspecified severe protein-calorie malnutrition: Secondary | ICD-10-CM | POA: Diagnosis not present

## 2021-07-27 DIAGNOSIS — M47814 Spondylosis without myelopathy or radiculopathy, thoracic region: Secondary | ICD-10-CM | POA: Diagnosis not present

## 2021-07-30 DIAGNOSIS — M545 Low back pain, unspecified: Secondary | ICD-10-CM | POA: Diagnosis not present

## 2021-07-30 DIAGNOSIS — M6281 Muscle weakness (generalized): Secondary | ICD-10-CM | POA: Diagnosis not present

## 2021-07-30 DIAGNOSIS — R0902 Hypoxemia: Secondary | ICD-10-CM | POA: Diagnosis not present

## 2021-07-30 DIAGNOSIS — I10 Essential (primary) hypertension: Secondary | ICD-10-CM | POA: Diagnosis not present

## 2021-07-30 DIAGNOSIS — S22070D Wedge compression fracture of T9-T10 vertebra, subsequent encounter for fracture with routine healing: Secondary | ICD-10-CM | POA: Diagnosis not present

## 2021-07-30 DIAGNOSIS — R2689 Other abnormalities of gait and mobility: Secondary | ICD-10-CM | POA: Diagnosis not present

## 2021-07-30 DIAGNOSIS — S22000D Wedge compression fracture of unspecified thoracic vertebra, subsequent encounter for fracture with routine healing: Secondary | ICD-10-CM | POA: Diagnosis not present

## 2021-07-30 DIAGNOSIS — R64 Cachexia: Secondary | ICD-10-CM | POA: Diagnosis not present

## 2021-07-30 DIAGNOSIS — R41841 Cognitive communication deficit: Secondary | ICD-10-CM | POA: Diagnosis not present

## 2021-07-30 DIAGNOSIS — I480 Paroxysmal atrial fibrillation: Secondary | ICD-10-CM | POA: Diagnosis not present

## 2021-07-30 DIAGNOSIS — R296 Repeated falls: Secondary | ICD-10-CM | POA: Diagnosis not present

## 2021-07-30 DIAGNOSIS — Z681 Body mass index (BMI) 19 or less, adult: Secondary | ICD-10-CM | POA: Diagnosis not present

## 2021-07-30 DIAGNOSIS — F09 Unspecified mental disorder due to known physiological condition: Secondary | ICD-10-CM | POA: Diagnosis not present

## 2021-07-30 DIAGNOSIS — E43 Unspecified severe protein-calorie malnutrition: Secondary | ICD-10-CM | POA: Diagnosis not present

## 2021-07-30 DIAGNOSIS — S22070A Wedge compression fracture of T9-T10 vertebra, initial encounter for closed fracture: Secondary | ICD-10-CM | POA: Diagnosis not present

## 2021-07-30 DIAGNOSIS — Z7401 Bed confinement status: Secondary | ICD-10-CM | POA: Diagnosis not present

## 2021-07-30 DIAGNOSIS — D649 Anemia, unspecified: Secondary | ICD-10-CM | POA: Diagnosis not present

## 2021-07-30 NOTE — Progress Notes (Deleted)
Cardiology Office Note Date:  07/30/2021  Patient ID:  Autumm, Hattery [birth year], MRN [id number] PCP:  Wilmer Floor., MD  Cardiologist:  Dr. Bing Matter Electrophysiologist: Dr. Elberta Fortis  ***refresh   Chief Complaint: *** wound check  History of Present Illness: Heather Thomas is a 87 y.o. female with history of PMR, HLD, AFib, SSSx w/PPM.  She comes in today to be seen for Dr. Elberta Fortis, last seen by him in the office may 2022, she was doing well, discussed not on a/c 2/2 risk of falls, maintaining SR.  Device was nearing ERI.  She saw Dr. Bing Matter Sept 2022, recently with L sided pain an ER visit found her with likely a chronic rib fx.  She was more tired, this apparently 2/2 pacemaker status.  She underwent gen change 07/19/21  *** site *** symptoms *** sotalol EKG, labs *** AF burden   Device information MDT dual chamber PPM implant 03/16/2009, gen change 07/19/21   Past Medical History:  Diagnosis Date   A-fib (HCC)    Acid reflux    AF (paroxysmal atrial fibrillation) (HCC)    Anemia    Atypical chest pain 03/30/2016   Bradycardia 08/12/2015   DJD (degenerative joint disease)    History of atrial fibrillation 12/05/2018   on pacemaker   History of esophageal reflux 12/05/2018   History of osteoporosis 12/05/2018   History of vertebral fracture 12/05/2018   C1 and C2 fracture s/p fall in 2017 h/o of multiple compression fractures in lumbar region   Hypercholesteremia 12/05/2018   Hypertrophic and atrophic condition of skin 10/07/2019   Ingrowing left great toenail 10/07/2019   Osteoporosis    Pacemaker 08/12/2015   PAF (paroxysmal atrial fibrillation) (HCC) 08/21/2017   Pain due to onychomycosis of toenails of both feet 10/07/2019   Plantar callus 10/07/2019   PMR (polymyalgia rheumatica) (HCC)    Post-nasal drip 02/21/2018   Primary osteoarthritis of both hands 12/05/2018   Renal insufficiency 12/05/2018   Vitamin D deficiency 12/05/2018    Past Surgical History:   Procedure Laterality Date   ABDOMINAL HYSTERECTOMY     COLONOSCOPY  07/09/2003   Colonic sigmoid diverticulosis. Otherwise normal attempted colonoscopy to 35cm.    ESOPHAGOGASTRODUODENOSCOPY  10/15/2014   Erosive gastritis. Otherwise normal EGD,    HIP FRACTURE SURGERY Right    INSERT / REPLACE / REMOVE PACEMAKER     Medtronic   PPM GENERATOR CHANGEOUT N/A 07/19/2021   Procedure: PPM GENERATOR CHANGEOUT;  Surgeon: Regan Lemming, MD;  Location: [facility];  Service: Cardiovascular;  Laterality: N/A;    Current Outpatient Medications  Medication Sig Dispense Refill   denosumab (PROLIA) 60 MG/ML SOSY injection Inject 60 mg into the skin every 6 (six) months. Due 08/2020     dicyclomine (BENTYL) 10 MG capsule Take 1 capsule (10 mg total) by mouth 2 (two) times daily as needed for spasms. 60 capsule 5   famotidine (PEPCID) 40 MG tablet Take 1 tablet (40 mg total) by mouth daily. 30 tablet 5   fluticasone (FLONASE) 50 MCG/ACT nasal spray Place 1 spray into both nostrils daily.     ibuprofen (ADVIL) 400 MG tablet Take 2 tablets (800 mg total) by mouth every 8 (eight) hours as needed. (Patient taking differently: Take 800 mg by mouth every 8 (eight) hours as needed for mild pain or moderate pain.) 14 tablet 0   nitroGLYCERIN (NITROSTAT) 0.4 MG SL tablet Place 1 tablet (0.4 mg total) under the tongue every  5 (five) minutes as needed for chest pain. 90 tablet 3   sotalol (BETAPACE) 80 MG tablet TAKE 1/2 TABLET(40 MG) BY MOUTH TWICE DAILY 90 tablet 2   No current facility-administered medications for this visit.    Allergies:   Codeine   Social History:  The patient  reports that she has never smoked. She has never used smokeless tobacco. She reports that she does not drink alcohol and does not use drugs.   Family History:  The patient's family history includes Cervical cancer in her daughter; Diabetes in her daughter; Heart attack in her mother; Heart disease in her daughter;  Lung disease in her son.***  ROS:  Please see the history of present illness.    All other systems are reviewed and otherwise negative.   PHYSICAL EXAM:  VS:  There were no vitals taken for this visit. BMI: There is no height or weight on file to calculate BMI. Well nourished, well developed, in no acute distress HEENT: normocephalic, atraumatic Neck: no JVD, carotid bruits or masses Cardiac:  *** RRR; no significant murmurs, no rubs, or gallops Lungs:  *** CTA b/l, no wheezing, rhonchi or rales Abd: soft, nontender MS: no deformity or *** atrophy Ext: *** no edema Skin: warm and dry, no rash Neuro:  No gross deficits appreciated Psych: euthymic mood, full affect  *** PPM/ICD site is stable, no tethering or discomfort   EKG:  Done today and reviewed by myself shows  ***  Device interrogation done today and reviewed by myself:  ***   TTE 2017  Review of the above records today demonstrates:  The left ventricular size is normal. The left ventricular wall motion is normal. The right ventricle is normal in size and function. Device lead in the right ventricle The left atrial size is normal. Right atrial size is normal. There is aortic valve sclerosis. There is trace aortic regurgitation. There is trace mitral regurgitation. There is mild tricuspid regurgitation. There is no pericardial effusion.  Recent Labs: 07/18/2021: BUN 25; Creatinine, Ser 1.22; Hemoglobin 11.6; Platelets 315; Potassium 5.3; Sodium 138  No results found for requested labs within last 8760 hours.   Estimated Creatinine Clearance: 17 mL/min (A) (by C-G formula based on SCr of 1.22 mg/dL (H)).   Wt Readings from Last 3 Encounters:  07/19/21 79 lb (35.8 kg)  06/09/21 77 lb 6.4 oz (35.1 kg)  03/29/21 80 lb 6 oz (36.5 kg)     Other studies reviewed: Additional studies/records reviewed today include: summarized above  ASSESSMENT AND PLAN:  PPM S/p gen change *** site  Paroxysmal  Afib CHA2DS2Vasc is 3, not on a/c 2/2 advanced age/fargility, and fall risk *** % burden *** sotalol, Qtc ***  Disposition: F/u with ***  Current medicines are reviewed at length with the patient today.  The patient did not have any concerns regarding medicines.  Heather Fredrickson, PA-C 07/30/2021 11:04 AM     [facility] [address] [place] [address] [phone] (office)  [phone] (fax)

## 2021-07-31 DIAGNOSIS — Z681 Body mass index (BMI) 19 or less, adult: Secondary | ICD-10-CM | POA: Diagnosis not present

## 2021-07-31 DIAGNOSIS — S22070A Wedge compression fracture of T9-T10 vertebra, initial encounter for closed fracture: Secondary | ICD-10-CM | POA: Diagnosis not present

## 2021-07-31 DIAGNOSIS — E43 Unspecified severe protein-calorie malnutrition: Secondary | ICD-10-CM | POA: Diagnosis not present

## 2021-08-01 ENCOUNTER — Encounter: Payer: Medicare HMO | Admitting: Physician Assistant

## 2021-08-01 DIAGNOSIS — E43 Unspecified severe protein-calorie malnutrition: Secondary | ICD-10-CM | POA: Diagnosis not present

## 2021-08-01 DIAGNOSIS — Z681 Body mass index (BMI) 19 or less, adult: Secondary | ICD-10-CM | POA: Diagnosis not present

## 2021-08-01 DIAGNOSIS — S22070A Wedge compression fracture of T9-T10 vertebra, initial encounter for closed fracture: Secondary | ICD-10-CM | POA: Diagnosis not present

## 2021-08-02 DIAGNOSIS — S22070A Wedge compression fracture of T9-T10 vertebra, initial encounter for closed fracture: Secondary | ICD-10-CM | POA: Diagnosis not present

## 2021-08-02 DIAGNOSIS — Z681 Body mass index (BMI) 19 or less, adult: Secondary | ICD-10-CM | POA: Diagnosis not present

## 2021-08-02 DIAGNOSIS — E43 Unspecified severe protein-calorie malnutrition: Secondary | ICD-10-CM | POA: Diagnosis not present

## 2021-08-03 DIAGNOSIS — Z681 Body mass index (BMI) 19 or less, adult: Secondary | ICD-10-CM | POA: Diagnosis not present

## 2021-08-03 DIAGNOSIS — E43 Unspecified severe protein-calorie malnutrition: Secondary | ICD-10-CM | POA: Diagnosis not present

## 2021-08-03 DIAGNOSIS — S22070A Wedge compression fracture of T9-T10 vertebra, initial encounter for closed fracture: Secondary | ICD-10-CM | POA: Diagnosis not present

## 2021-08-04 DIAGNOSIS — Z681 Body mass index (BMI) 19 or less, adult: Secondary | ICD-10-CM | POA: Diagnosis not present

## 2021-08-04 DIAGNOSIS — S22070A Wedge compression fracture of T9-T10 vertebra, initial encounter for closed fracture: Secondary | ICD-10-CM | POA: Diagnosis not present

## 2021-08-04 DIAGNOSIS — E43 Unspecified severe protein-calorie malnutrition: Secondary | ICD-10-CM | POA: Diagnosis not present

## 2021-08-05 DIAGNOSIS — S22070D Wedge compression fracture of T9-T10 vertebra, subsequent encounter for fracture with routine healing: Secondary | ICD-10-CM | POA: Diagnosis not present

## 2021-08-05 DIAGNOSIS — I10 Essential (primary) hypertension: Secondary | ICD-10-CM | POA: Diagnosis not present

## 2021-08-05 DIAGNOSIS — Z7401 Bed confinement status: Secondary | ICD-10-CM | POA: Diagnosis not present

## 2021-08-05 DIAGNOSIS — M6281 Muscle weakness (generalized): Secondary | ICD-10-CM | POA: Diagnosis not present

## 2021-08-05 DIAGNOSIS — I495 Sick sinus syndrome: Secondary | ICD-10-CM | POA: Diagnosis not present

## 2021-08-05 DIAGNOSIS — I480 Paroxysmal atrial fibrillation: Secondary | ICD-10-CM | POA: Diagnosis not present

## 2021-08-05 DIAGNOSIS — Z681 Body mass index (BMI) 19 or less, adult: Secondary | ICD-10-CM | POA: Diagnosis not present

## 2021-08-05 DIAGNOSIS — R41841 Cognitive communication deficit: Secondary | ICD-10-CM | POA: Diagnosis not present

## 2021-08-05 DIAGNOSIS — S22000D Wedge compression fracture of unspecified thoracic vertebra, subsequent encounter for fracture with routine healing: Secondary | ICD-10-CM | POA: Diagnosis not present

## 2021-08-05 DIAGNOSIS — S22070A Wedge compression fracture of T9-T10 vertebra, initial encounter for closed fracture: Secondary | ICD-10-CM | POA: Diagnosis not present

## 2021-08-05 DIAGNOSIS — F02811 Dementia in other diseases classified elsewhere, unspecified severity, with agitation: Secondary | ICD-10-CM | POA: Diagnosis not present

## 2021-08-05 DIAGNOSIS — R0902 Hypoxemia: Secondary | ICD-10-CM | POA: Diagnosis not present

## 2021-08-05 DIAGNOSIS — R001 Bradycardia, unspecified: Secondary | ICD-10-CM | POA: Diagnosis not present

## 2021-08-05 DIAGNOSIS — R2689 Other abnormalities of gait and mobility: Secondary | ICD-10-CM | POA: Diagnosis not present

## 2021-08-05 DIAGNOSIS — E43 Unspecified severe protein-calorie malnutrition: Secondary | ICD-10-CM | POA: Diagnosis not present

## 2021-08-05 DIAGNOSIS — M545 Low back pain, unspecified: Secondary | ICD-10-CM | POA: Diagnosis not present

## 2021-08-15 ENCOUNTER — Other Ambulatory Visit: Payer: Self-pay

## 2021-08-15 ENCOUNTER — Ambulatory Visit (INDEPENDENT_AMBULATORY_CARE_PROVIDER_SITE_OTHER): Payer: Medicare HMO | Admitting: Cardiology

## 2021-08-15 DIAGNOSIS — F02811 Dementia in other diseases classified elsewhere, unspecified severity, with agitation: Secondary | ICD-10-CM

## 2021-08-15 DIAGNOSIS — Z711 Person with feared health complaint in whom no diagnosis is made: Secondary | ICD-10-CM

## 2021-08-15 DIAGNOSIS — I495 Sick sinus syndrome: Secondary | ICD-10-CM

## 2021-08-15 DIAGNOSIS — R296 Repeated falls: Secondary | ICD-10-CM

## 2021-08-15 DIAGNOSIS — R001 Bradycardia, unspecified: Secondary | ICD-10-CM | POA: Diagnosis not present

## 2021-08-15 NOTE — Patient Instructions (Addendum)
Medication Instructions:  Your physician recommends that you continue on your current medications as directed. Please refer to the Current Medication list given to you today.  *If you need a refill on your cardiac medications before your next appointment, please call your pharmacy*   Lab Work: None ordered If you have labs (blood work) drawn today and your tests are completely normal, you will receive your results only by: MyChart Message (if you have MyChart) OR A paper copy in the mail If you have any lab test that is abnormal or we need to change your treatment, we will call you to review the results.   Testing/Procedures: None ordered   Follow-Up: At [facility], you and your health needs are our priority.  As part of our continuing mission to provide you with exceptional heart care, we have created designated Provider Care Teams.  These Care Teams include your primary Cardiologist (physician) and Advanced Practice Providers (APPs -  Physician Assistants and Nurse Practitioners) who all work together to provide you with the care you need, when you need it.  We recommend signing up for the patient portal called "MyChart".  Sign up information is provided on this After Visit Summary.  MyChart is used to connect with patients for Virtual Visits (Telemedicine).  Patients are able to view lab/test results, encounter notes, upcoming appointments, etc.  Non-urgent messages can be sent to your provider as well.   To learn more about what you can do with MyChart, go to [email or web address].    Remote monitoring is used to monitor your Pacemaker or ICD from home. This monitoring reduces the number of office visits required to check your device to one time per year. It allows [place] to keep an eye on the functioning of your device to ensure it is working properly. You are scheduled for a device check from home on 10/20/2021. You may send your transmission at any time that day. If you have a  wireless device, the transmission will be sent automatically. After your physician reviews your transmission, you will receive a postcard with your next transmission date.  Your next appointment:   9 months   The format for your next appointment:   In Person   Provider:   Loman Brooklyn, MD   You have been referred to [facility]   Thank you for choosing [facility]!!   Dory Horn, RN [phone]    Other Instructions

## 2021-08-15 NOTE — Progress Notes (Addendum)
Wound check appointment s/p gen ppm gen change 07/19/21 for SSS/T-B syndrome. Patient presents to clinic appointment in wheel chair accompainied by grandson. Resides at [facility] x 1 week. Unresponsive to verbal stimuli however crys out in pain when RN attempts to reposition in wheelchair. Bruising noted to face from previous fall. Patient emaciated and per grandson eating and drinking very little. Patient appears to be unable to support self in upright position. Fully dependent on wheel chair for support.   Steri-strips removed. Wound without redness or edema. Incision edges approximated, wound well healed. Normal device function. Thresholds, sensing, and impedances consistent with implant measurements. AT/AF burden 1.8% with know history. No OAC at this time. Device programmed at chronic lead settings. Histogram distribution appropriate for patient and level of activity. Dr. Elberta Fortis into see patient and consulted with patient's gen Cards Dr. Bing Matter who agrees patient's physical condition has significantly worsened over the last two weeks.  Hospice referral today for rapidly worsening overall health.  Spoke with Associate Professor at [employer], Vladimir Crofts, LPN who agrees patient is appropriate for palliative care/hospice referral. Patient discharged by to [facility] without any additional orders or changes to current therapy at this time. Grandson will provide Nursing Home with AVS.   MD addendum Patient presented device clinic today.  Patient was minimally responsive, complaining of only pain.  She had had a recent fall and had quite a bit of bruising around her face.  Device interrogation is without issues.  Patient's son is quite frustrated.  He has been told that she likely does not have long to live.  I agree with this based on my interaction with her today.  She does have dementia, and could have some delirium as well.  I feel that hospice care should evaluate the patient as she is quite  uncomfortable and likely nearing the end of life.  Loman Brooklyn, MD

## 2021-09-01 DEATH — deceased

## 2021-10-10 ENCOUNTER — Encounter: Payer: Medicare HMO | Admitting: Cardiology

## 2021-10-21 ENCOUNTER — Other Ambulatory Visit: Payer: Self-pay | Admitting: Gastroenterology

## 2021-10-24 ENCOUNTER — Encounter: Payer: Medicare HMO | Admitting: Cardiology

## 2021-12-12 ENCOUNTER — Ambulatory Visit: Payer: Medicare HMO | Admitting: Cardiology

## 2022-10-24 IMAGING — DX DG RIBS W/ CHEST 3+V*L*
3 series · 3 of 3 positions shown · non-contrast
Comparison: Chest x-ray September 12, 2020

CLINICAL DATA: Left lower rib pain.  No known trauma.

EXAM:
LEFT RIBS AND CHEST - 3+ VIEW

[chest pa (1 of 2)]
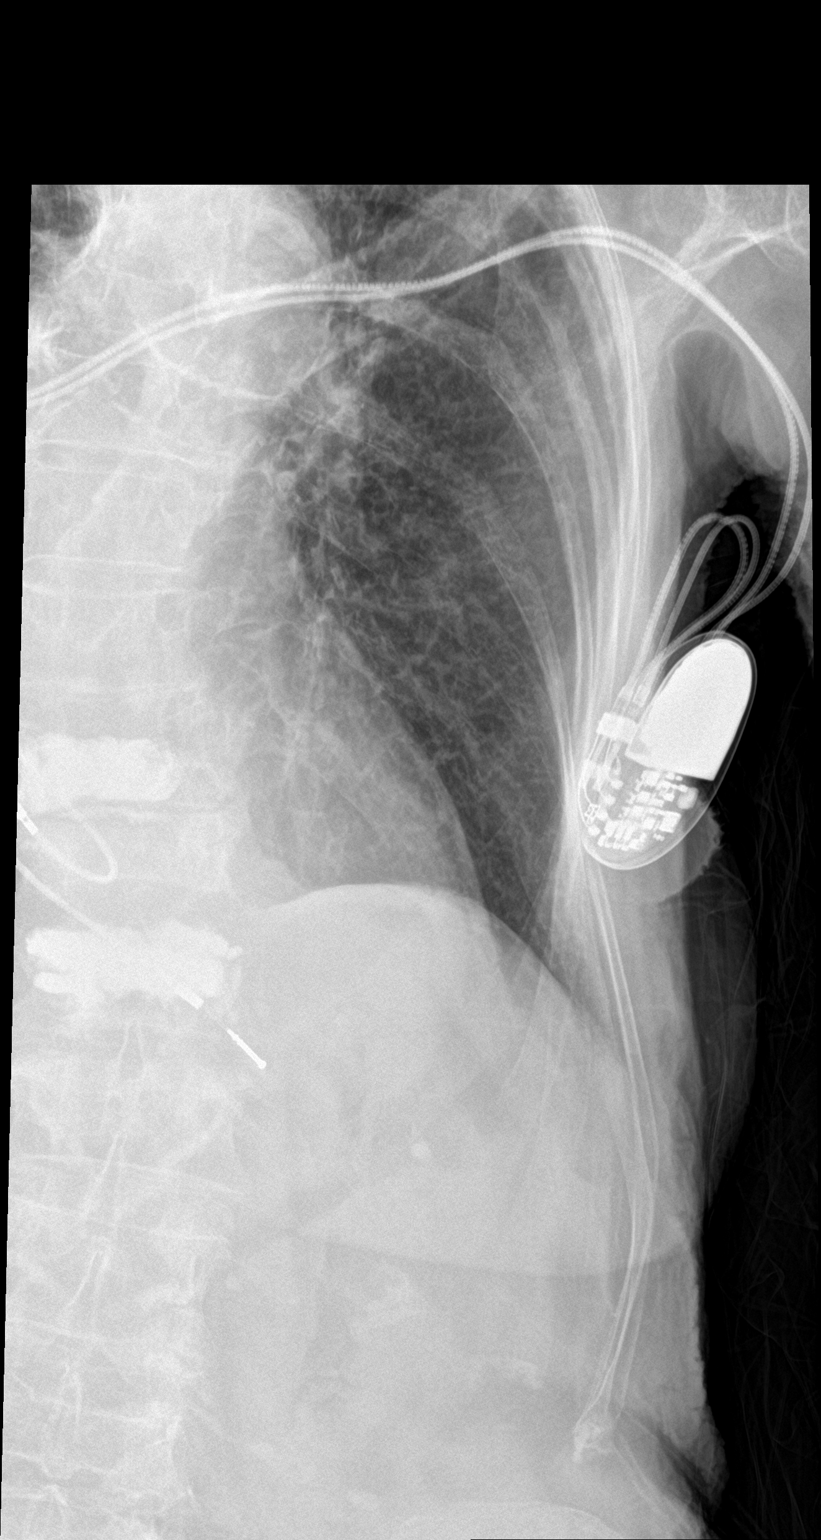

[rib pa]
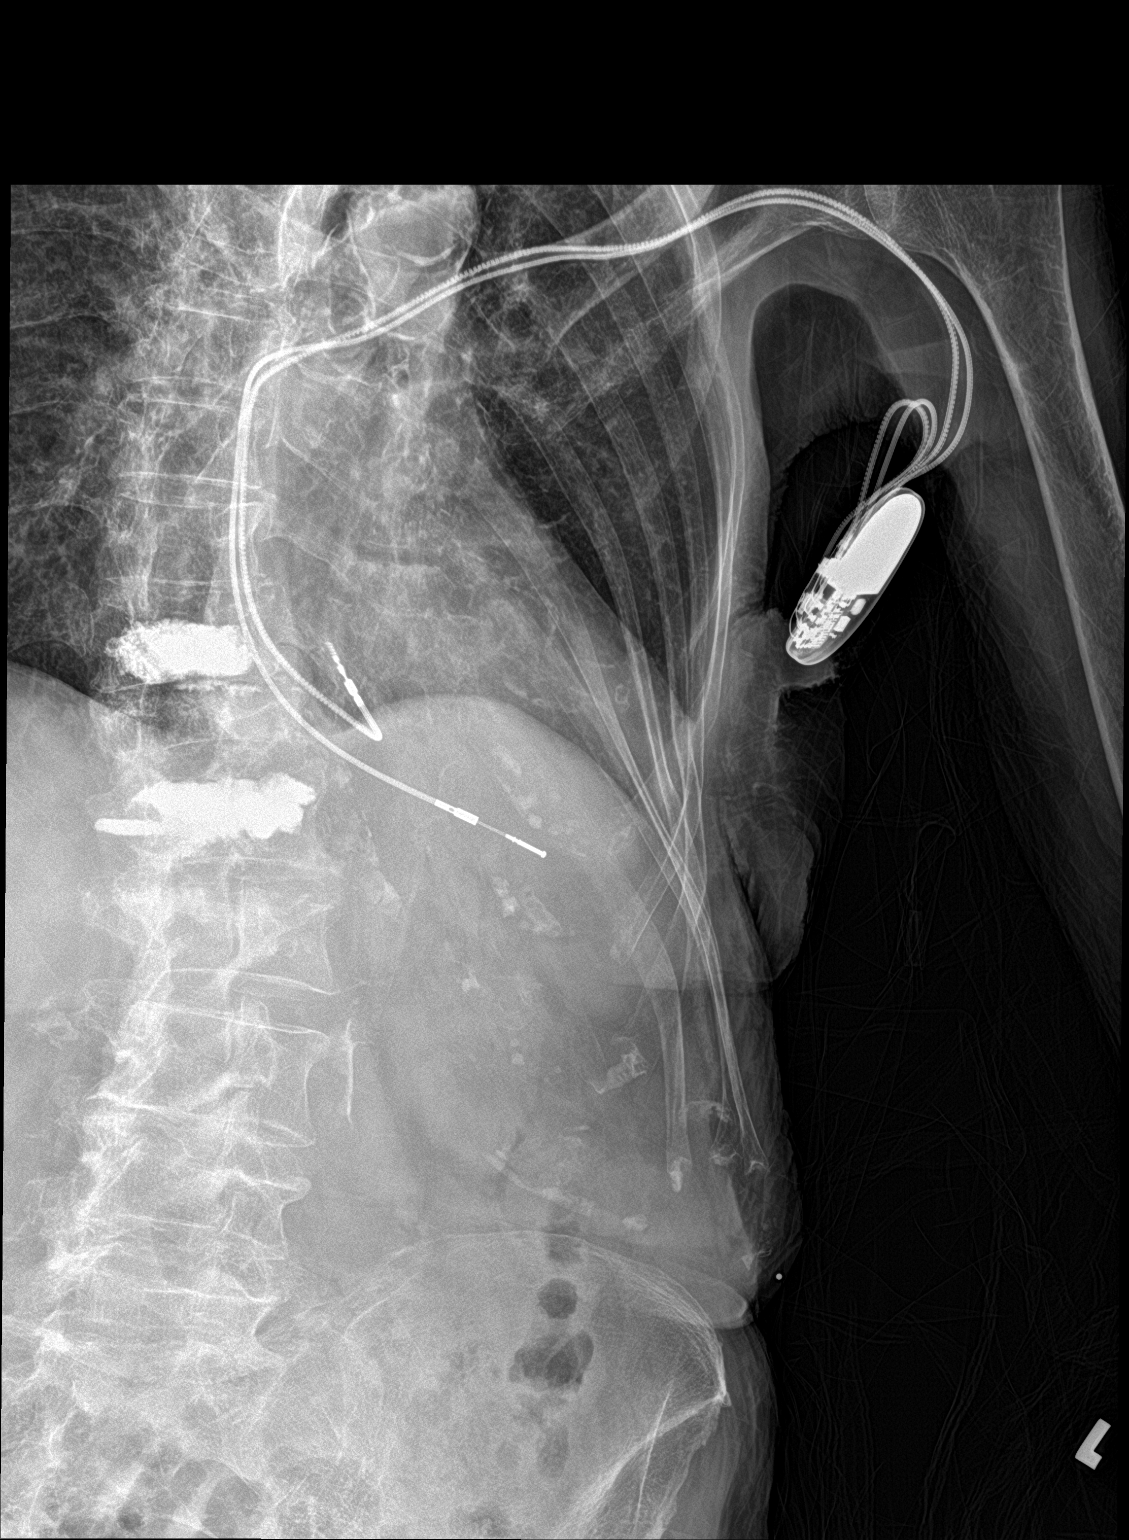

[chest pa (2 of 2)]
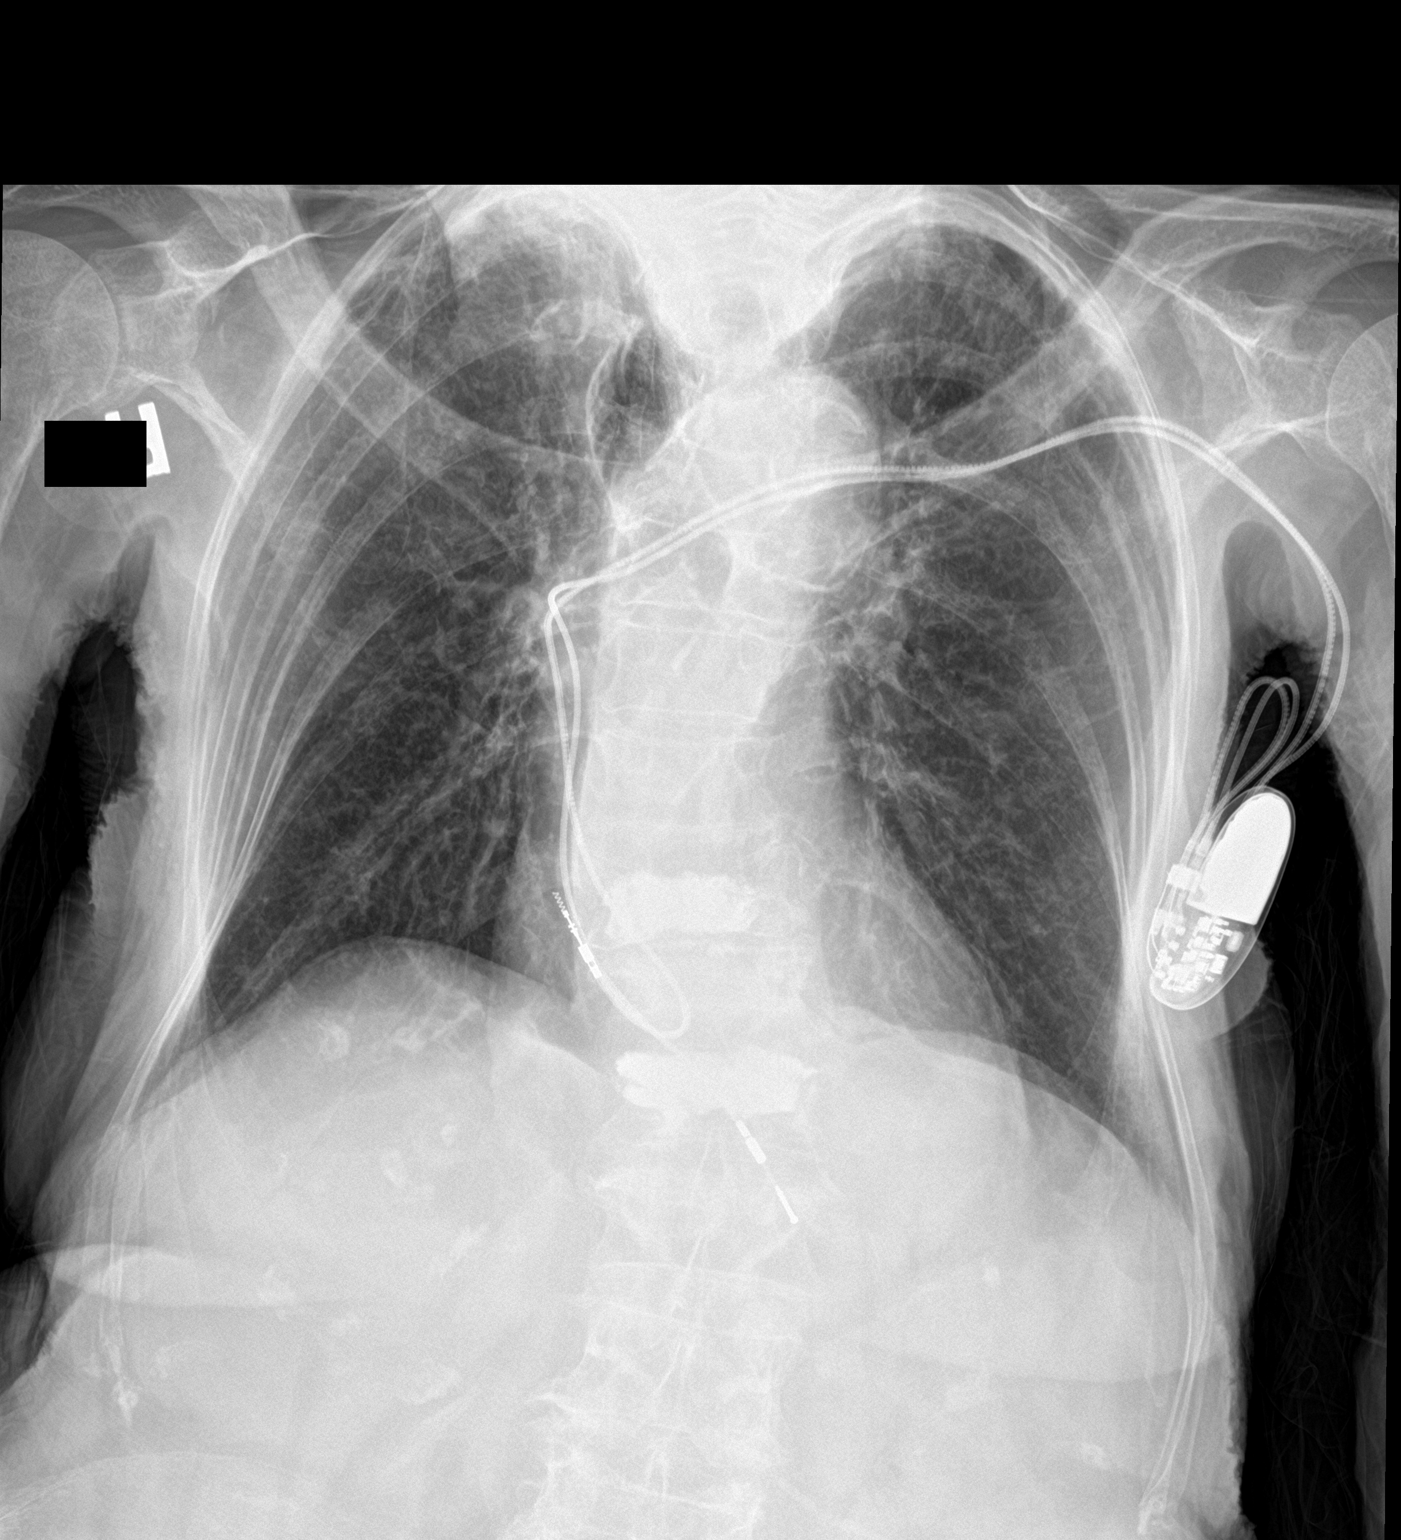

[3 of 3 positions shown; findings below may reference images not displayed]

FINDINGS: No fracture or other bone lesions are seen involving the ribs. There
is no evidence of pneumothorax or pleural effusion. Both lungs are
clear. Heart size and mediastinal contours are within normal limits.

There is soft tissue calcifications underlying the BB.
IMPRESSION: 1. No rib fractures identified.  No changes to the chest.
2. Soft tissue calcifications underlying the BB may represent
costochondral calcifications.
